# Patient Record
Sex: Female | Born: 1950 | Race: White | Hispanic: No | Marital: Married | Smoking: Never smoker
Health system: Southern US, Community
[De-identification: ages and names within clinical notes are randomized; demographics above are authoritative.]

## PROBLEM LIST (undated history)

## (undated) ENCOUNTER — Ambulatory Visit: Admission: EM | Payer: Medicare HMO | Source: Home / Self Care

## (undated) DIAGNOSIS — K219 Gastro-esophageal reflux disease without esophagitis: Secondary | ICD-10-CM

## (undated) DIAGNOSIS — I639 Cerebral infarction, unspecified: Secondary | ICD-10-CM

## (undated) DIAGNOSIS — R079 Chest pain, unspecified: Secondary | ICD-10-CM

## (undated) DIAGNOSIS — F32A Depression, unspecified: Secondary | ICD-10-CM

## (undated) DIAGNOSIS — E785 Hyperlipidemia, unspecified: Secondary | ICD-10-CM

## (undated) DIAGNOSIS — F329 Major depressive disorder, single episode, unspecified: Secondary | ICD-10-CM

## (undated) DIAGNOSIS — I213 ST elevation (STEMI) myocardial infarction of unspecified site: Secondary | ICD-10-CM

## (undated) DIAGNOSIS — I10 Essential (primary) hypertension: Secondary | ICD-10-CM

## (undated) DIAGNOSIS — F419 Anxiety disorder, unspecified: Secondary | ICD-10-CM

## (undated) DIAGNOSIS — R51 Headache: Secondary | ICD-10-CM

## (undated) HISTORY — DX: Gastro-esophageal reflux disease without esophagitis: K21.9

## (undated) HISTORY — PX: COLONOSCOPY: SHX5424

## (undated) HISTORY — DX: Chest pain, unspecified: R07.9

## (undated) HISTORY — DX: Cerebral infarction, unspecified: I63.9

## (undated) HISTORY — DX: Hyperlipidemia, unspecified: E78.5

## (undated) HISTORY — PX: TUBAL LIGATION: SHX77

---

## 2002-03-29 ENCOUNTER — Emergency Department (HOSPITAL_COMMUNITY): Admission: EM | Admit: 2002-03-29 | Discharge: 2002-03-29 | Payer: Self-pay | Admitting: Emergency Medicine

## 2003-05-30 ENCOUNTER — Other Ambulatory Visit: Admission: RE | Admit: 2003-05-30 | Discharge: 2003-05-30 | Payer: Self-pay | Admitting: Obstetrics and Gynecology

## 2003-10-21 ENCOUNTER — Ambulatory Visit (HOSPITAL_COMMUNITY): Admission: RE | Admit: 2003-10-21 | Discharge: 2003-10-21 | Payer: Self-pay | Admitting: Gastroenterology

## 2004-06-08 ENCOUNTER — Other Ambulatory Visit: Admission: RE | Admit: 2004-06-08 | Discharge: 2004-06-08 | Payer: Self-pay | Admitting: Obstetrics and Gynecology

## 2005-10-12 ENCOUNTER — Other Ambulatory Visit: Admission: RE | Admit: 2005-10-12 | Discharge: 2005-10-12 | Payer: Self-pay | Admitting: Obstetrics and Gynecology

## 2008-05-10 ENCOUNTER — Emergency Department (HOSPITAL_COMMUNITY): Admission: EM | Admit: 2008-05-10 | Discharge: 2008-05-10 | Payer: Self-pay | Admitting: Emergency Medicine

## 2010-03-19 DIAGNOSIS — R079 Chest pain, unspecified: Secondary | ICD-10-CM

## 2010-03-19 HISTORY — DX: Chest pain, unspecified: R07.9

## 2011-01-12 ENCOUNTER — Encounter
Admission: RE | Admit: 2011-01-12 | Discharge: 2011-01-12 | Payer: Self-pay | Source: Home / Self Care | Attending: Obstetrics and Gynecology | Admitting: Obstetrics and Gynecology

## 2011-05-14 NOTE — Op Note (Signed)
NAMETamia, [name]                             ACCOUNT NO.:  [id number]   MEDICAL RECORD NO.:  [id number]                   PATIENT TYPE:  AMB   LOCATION:  ENDO                                 FACILITY:  [facility]   PHYSICIAN:  Anselmo Rod, M.D.               DATE OF BIRTH:  05/08/1951   DATE OF PROCEDURE:  10/21/2003  DATE OF DISCHARGE:                                 OPERATIVE REPORT   PROCEDURE:  Screening colonoscopy.   ENDOSCOPIST:  Anselmo Rod, M.D.   INSTRUMENT USED:  Olympus video colonoscope.   INDICATIONS FOR PROCEDURE:  A screening colonoscopy is being performed in a  60 year old white female.  Rule out colonic polyps, masses, etc.   PRE-PROCEDURE PREPARATION:  An informed consent was procured from the  patient.  The patient was fasted for eight hours prior to the procedure, and  prepped with a bottle of magnesium citrate and one gallon of GoLYTELY on the  night prior to the procedure.   PRE-PROCEDURE PHYSICAL EXAMINATION:  VITAL SIGNS:  The patient had stable  vital signs.  NECK:  Supple.  CHEST:  Clear to auscultation.  HEART:  S1, S2 regular.  ABDOMEN:  Soft with normal bowel sounds.   DESCRIPTION OF PROCEDURE:  The patient was placed in the left lateral  decubitus position and sedated with 80 mg of Demerol and 8 mg of Versed  intravenously.  Once the patient was adequately sedated and maintained on  low-flow oxygen and continuous cardiac monitoring, the Olympus video  colonoscope was advanced from the rectum to the cecum.  The appendicular  orifice and the ileocecal valve were clearly visualized and photographed.  There was some residual stool in the colon.  Multiple washings were done.  Small lesions could have been missed.  No masses, polyps, erosions or  ulcerations were seen.  A small internal hemorrhoid was seen on retroflexion  in the rectum.  The patient tolerated the procedure well without complication.   IMPRESSION:  Normal colonoscopy up to  the cecum, except for a small internal  hemorrhoid.   RECOMMENDATIONS:  1. A high fiber diet with liberal fluid intake has been advocated.  2.     Repeat colorectal screening is recommended in the next 10 years, unless the      patient develops any abnormal symptoms in the interim.  3. Outpatient followup as the need arises in the future.                                               Anselmo Rod, M.D.    JNM/MEDQ  D:  10/21/2003  T:  10/21/2003  Job:  [id number]   cc:   Marcelino Duster L. Vincente Poli, M.D.  [address], [address]  [place]  [place] [address]  Fax: [phone]

## 2011-09-15 ENCOUNTER — Encounter: Payer: Self-pay | Admitting: *Deleted

## 2011-09-15 ENCOUNTER — Other Ambulatory Visit: Payer: Self-pay

## 2011-09-15 ENCOUNTER — Emergency Department (HOSPITAL_COMMUNITY)
Admission: EM | Admit: 2011-09-15 | Discharge: 2011-09-16 | Disposition: A | Discharge status: Home / Self Care | Payer: 59 | Attending: Emergency Medicine | Admitting: Emergency Medicine

## 2011-09-15 ENCOUNTER — Emergency Department (HOSPITAL_COMMUNITY): Payer: 59

## 2011-09-15 DIAGNOSIS — M542 Cervicalgia: Secondary | ICD-10-CM | POA: Insufficient documentation

## 2011-09-15 DIAGNOSIS — R51 Headache: Secondary | ICD-10-CM | POA: Insufficient documentation

## 2011-09-15 DIAGNOSIS — I10 Essential (primary) hypertension: Secondary | ICD-10-CM | POA: Insufficient documentation

## 2011-09-15 DIAGNOSIS — R0602 Shortness of breath: Secondary | ICD-10-CM | POA: Insufficient documentation

## 2011-09-15 DIAGNOSIS — R079 Chest pain, unspecified: Secondary | ICD-10-CM

## 2011-09-15 DIAGNOSIS — E876 Hypokalemia: Secondary | ICD-10-CM | POA: Insufficient documentation

## 2011-09-15 DIAGNOSIS — R0789 Other chest pain: Secondary | ICD-10-CM | POA: Insufficient documentation

## 2011-09-15 HISTORY — DX: Essential (primary) hypertension: I10

## 2011-09-15 LAB — BASIC METABOLIC PANEL
CO2: 23 mEq/L (ref 19–32)
Chloride: 106 mEq/L (ref 96–112)
Creatinine, Ser: 0.72 mg/dL (ref 0.50–1.10)
GFR calc Af Amer: >60 mL/min (ref >60)
Potassium: 2.7 mEq/L — CL (ref 3.5–5.1)
Sodium: 141 mEq/L (ref 135–145)

## 2011-09-15 LAB — CBC
HCT: 38.7 % (ref 36.0–46.0)
Hemoglobin: 13 g/dL (ref 12.0–15.0)
MCV: 89.8 fL (ref 78.0–100.0)
RBC: 4.31 MIL/uL (ref 3.87–5.11)
RDW: 13.2 % (ref 11.5–15.5)
WBC: 8.7 10*3/uL (ref 4.0–10.5)

## 2011-09-15 LAB — HEPATIC FUNCTION PANEL
AST: 19 U/L (ref 0–37)
Albumin: 4.1 g/dL (ref 3.5–5.2)
Alkaline Phosphatase: 78 U/L (ref 39–117)
Total Protein: 7.2 g/dL (ref 6.0–8.3)

## 2011-09-15 LAB — DIFFERENTIAL
Basophils Absolute: 0 10*3/uL (ref 0.0–0.1)
Eosinophils Relative: 2 % (ref 0–5)
Lymphocytes Relative: 40 % (ref 12–46)
Lymphs Abs: 3.5 10*3/uL (ref 0.7–4.0)
Monocytes Absolute: 0.5 10*3/uL (ref 0.1–1.0)
Neutro Abs: 4.5 10*3/uL (ref 1.7–7.7)

## 2011-09-15 LAB — POCT I-STAT TROPONIN I
Troponin i, poc: 0 ng/mL (ref 0.00–0.08)
Troponin i, poc: 0.02 ng/mL (ref 0.00–0.08)

## 2011-09-15 MED ORDER — SODIUM CHLORIDE 0.9 % IV SOLN
INTRAVENOUS | Status: DC
  Administered 2011-09-15: 20:00:00 via INTRAVENOUS

## 2011-09-15 MED ORDER — ACETAMINOPHEN 325 MG PO TABS
650.0000 mg | ORAL_TABLET | Freq: Once | ORAL | Status: AC
  Administered 2011-09-15: 650 mg via ORAL
  Filled 2011-09-15: qty 2

## 2011-09-15 MED ORDER — POTASSIUM CHLORIDE 10 MEQ/100ML IV SOLN
10.0000 meq | Freq: Once | INTRAVENOUS | Status: AC
  Administered 2011-09-15: 10 meq via INTRAVENOUS
  Filled 2011-09-15: qty 100

## 2011-09-15 MED ORDER — PANTOPRAZOLE SODIUM 40 MG IV SOLR
40.0000 mg | Freq: Once | INTRAVENOUS | Status: AC
  Administered 2011-09-15: 40 mg via INTRAVENOUS
  Filled 2011-09-15: qty 40

## 2011-09-15 MED ORDER — ONDANSETRON HCL 4 MG/2ML IJ SOLN
4.0000 mg | Freq: Once | INTRAMUSCULAR | Status: AC
  Administered 2011-09-15: 4 mg via INTRAVENOUS
  Filled 2011-09-15: qty 2

## 2011-09-15 MED ORDER — NITROGLYCERIN 0.4 MG SL SUBL: 0.4000 mg | SUBLINGUAL_TABLET | Freq: Once | SUBLINGUAL | Status: DC

## 2011-09-15 MED ORDER — ASPIRIN 81 MG PO CHEW
324.0000 mg | CHEWABLE_TABLET | Freq: Once | ORAL | Status: AC
  Administered 2011-09-15: 324 mg via ORAL
  Filled 2011-09-15: qty 4

## 2011-09-15 MED ORDER — NITROGLYCERIN 0.4 MG SL SUBL
0.4000 mg | SUBLINGUAL_TABLET | Freq: Once | SUBLINGUAL | Status: AC
  Administered 2011-09-15: 0.4 mg via SUBLINGUAL
  Filled 2011-09-15: qty 25

## 2011-09-15 MED ORDER — PANTOPRAZOLE SODIUM 20 MG PO TBEC: 20.0000 mg | DELAYED_RELEASE_TABLET | Freq: Every day | ORAL | Status: DC

## 2011-09-15 NOTE — ED Notes (Signed)
Pt provided a drink at this time. Pt states a increase in pain in the upper part of her throat when swallowing. EDP notified.

## 2011-09-15 NOTE — ED Notes (Signed)
MD at bedside. 

## 2011-09-15 NOTE — ED Notes (Signed)
Pt advised of further testing to be done & timeframe. Pt remains on cardiac monitor w/ NIBP vital signs WNL. NAD noted. No needs voiced at this time.

## 2011-09-15 NOTE — ED Provider Notes (Addendum)
History     CSN: [id number] Arrival date & time: 09/15/2011  6:42 PM   Chief Complaint  Patient presents with  . Chest Pain     (Include location/radiation/quality/duration/timing/severity/associated sxs/prior treatment) The history is provided by the patient and the spouse.  SEE MDM FOR SPECIFICS. NO PAST HX OF CAD.  NO HX OF SIMILAR PAIN OTHER THAN BRIEFLY EALRIER TODAYS. EPISODE AT 1830 DISCOMFORT PERSISTS. BUT IMPROVING.   Past Medical History  Diagnosis Date  . Hypertension      History reviewed. No pertinent past surgical history.  No family history on file.  History  Substance Use Topics  . Smoking status: Never Smoker   . Smokeless tobacco: Not on file  . Alcohol Use: No    OB History    Grav Para Term Preterm Abortions TAB SAB Ect Mult Living                  Review of Systems  Constitutional: Negative for fever and diaphoresis.  HENT: Positive for neck pain. Negative for congestion.   Eyes: Negative for redness and visual disturbance.  Respiratory: Positive for chest tightness and shortness of breath. Negative for cough, choking and stridor.   Cardiovascular: Positive for chest pain. Negative for palpitations.  Gastrointestinal: Negative for nausea, vomiting, abdominal pain and diarrhea.  Genitourinary: Negative for dysuria.  Musculoskeletal: Negative for myalgias and back pain.  Skin: Negative for rash.  Neurological: Positive for headaches. Negative for weakness and numbness.  Psychiatric/Behavioral: Negative for confusion.    Allergies  Penicillins  Home Medications   Current Outpatient Rx  Name Route Sig Dispense Refill  . BUPROPION HCL (XL) 300 MG PO TB24 Oral Take 300 mg by mouth daily.        Physical Exam    BP 114/69  Pulse 70  Temp(Src) 97.8 F (36.6 C) (Oral)  Resp 20  Ht 5\' 5"  (1.651 m)  Wt 160 lb (72.576 kg)  BMI 26.63 kg/m2  SpO2 97%  Physical Exam  Nursing note and vitals reviewed. Constitutional: She is oriented to  person, place, and time. She appears well-developed and well-nourished. No distress.  HENT:  Head: Normocephalic and atraumatic.  Mouth/Throat: Oropharynx is clear and moist.  Eyes: Conjunctivae and EOM are normal. Pupils are equal, round, and reactive to light.  Neck: Normal range of motion. Neck supple.  Cardiovascular: Normal rate, regular rhythm, normal heart sounds and intact distal pulses.   No murmur heard. Pulmonary/Chest: Effort normal and breath sounds normal. She has no wheezes. She exhibits no tenderness.  Abdominal: Soft. Bowel sounds are normal. There is no tenderness.  Musculoskeletal: Normal range of motion. She exhibits no edema and no tenderness.  Lymphadenopathy:    She has no cervical adenopathy.  Neurological: She is alert and oriented to person, place, and time. No cranial nerve deficit. She exhibits normal muscle tone. Coordination normal.  Skin: Skin is warm and dry. No rash noted. No erythema.    ED Course  Procedures  Results for orders placed during the hospital encounter of 09/15/11  POCT I-STAT TROPONIN I      Component Value Range   Troponin i, poc 0.00  0.00 - 0.08 (ng/mL)   Comment 3            Dg Chest Portable 1 View  09/15/2011  *RADIOLOGY REPORT*  Clinical Data: Chest pain started 20 minutes ago.  PORTABLE CHEST - 1 VIEW  Comparison: None.  Findings: The heart size is normal.  Mild  infra hilar bronchitic change is present bilaterally.  Minimal bibasilar atelectasis is noted.  No significant consolidation is evident.  The slight leftward curvature is noted in the mid thoracic spine.  The visualized soft tissues and bony thorax are otherwise unremarkable.  IMPRESSION:  1.  Mild infra hilar bronchitic change is likely chronic. 2.  Minimal bibasilar atelectasis. 3.  No other acute cardiopulmonary disease.  Original Report Authenticated By: Jamesetta Orleans. MATTERN, M.D.      Date: 09/15/2011  Rate: 71  Rhythm: normal sinus rhythm  QRS Axis: normal   Intervals: normal  ST/T Wave abnormalities: normal  Conduction Disutrbances:none  Narrative Interpretation:   Old EKG Reviewed: none available    MDM ATYPICAL CHEST PAIN AND THROAT BURNING AT 1830 WILL TAKING FIRST BITE OF FOOD. IT WAS SEVERE AND FELT LIKE THROAT WAS CLOSING AND PAIN ACROSS CHEST. SOME SOB NO DIAPHORISIS. SIMILAR BUT MILDER EVENT AT NOON WHILE TAKING ONE BITE OF A BANANA. CARDIAC RISK FACTORS INCLUDE HIGH CHOLESTEROL AND AND HTN. EVENTS SOUND NONCARDIAC AND MAY BE ESOPHAGEAL RELATED. FIRST 2 CARDAIC TN NEGATIVE CXR NEGATIVE LABS NEGATIVE EX FOR HYPOKALEMIA 2.7 RX WITH IV POTASSIUM X 2 10 MEQ. WILL REPEAT TN AT HOUR MARK 0030 IF NEG CAN BE DISCHARGED ON PROTONIX AND HAS CARDIOLOGY FOLLOW UP TOMORROW. NO HX OF CAD TO DATE. IN ED RX WITH ASA NTG AND PROTONIX. NOW ABLE TO DRINK SPRITE FIND, NOT CW OBSTRUCTON OR FOOD IMPACTION.   IMP: CHEST PAIN.          Shelda Jakes, MD 09/15/11 0981  Shelda Jakes, MD 09/15/11 1914  Shelda Jakes, MD 09/15/11 [phone]

## 2011-09-15 NOTE — ED Notes (Signed)
C/o chest burning and felt like throat was closing

## 2011-09-15 NOTE — ED Notes (Signed)
Pt sipping on drink at this time. States not hurting like earlier & thinks she can take meds. EDP notified, verbal order for tylenol given.

## 2011-09-15 NOTE — ED Notes (Signed)
Nitro held at this time. Pt states chest pain is better.

## 2011-09-15 NOTE — ED Notes (Signed)
Pt reports cp that started about 20 min ago. Described as a tightness & also states her jaw feels numb. ekg done. Saline lock in place & blood drawn.

## 2011-09-15 NOTE — ED Notes (Signed)
Pt states no change in pain after nitro.

## 2011-09-15 NOTE — ED Notes (Signed)
CRITICAL VALUE ALERT  Critical value received:  Potassium 2.7  Date of notification:  09/15/11  Time of notification:  1940  Critical value read back:yes  Nurse who received alert:  Thornton Dales, RN  MD notified (1st page):  [email or web address]  Time of first page:  1940  MD notified (2nd page):  Time of second page:  Responding MD:  zackowski  Time MD responded:  [phone]

## 2011-09-16 LAB — POCT I-STAT TROPONIN I: Troponin i, poc: 0.04 ng/mL (ref 0.00–0.08)

## 2011-09-16 MED ORDER — POTASSIUM CHLORIDE CRYS ER 20 MEQ PO TBCR
60.0000 meq | EXTENDED_RELEASE_TABLET | Freq: Once | ORAL | Status: AC
  Administered 2011-09-16: 60 meq via ORAL
  Filled 2011-09-16: qty 3

## 2011-09-16 NOTE — Progress Notes (Signed)
0003 Assumed care/disposition of patient. Patient presented with esophageal symptoms. Cardiac marker x 1  negative. Awaiting 2nd marker. If marker normal, patient to be discharged home. She has follow up later today with [facility]. Potassium was 2.7. She received 2 runs of 10 mEq potassium. 0144 Cardiac marker negative. Patient is painfree. Will replete additional potassium orally. She is to keep appointment tomorrow at [facility].Given 60 mEq potassium PO. Results for orders placed during the hospital encounter of 09/15/11  BASIC METABOLIC PANEL      Component Value Range   Sodium 141  135 - 145 (mEq/L)   Potassium 2.7 (*) 3.5 - 5.1 (mEq/L)   Chloride 106  96 - 112 (mEq/L)   CO2 23  19 - 32 (mEq/L)   Glucose, Bld 78  70 - 99 (mg/dL)   BUN 23  6 - 23 (mg/dL)   Creatinine, Ser 6.57  0.50 - 1.10 (mg/dL)   Calcium 9.1  8.4 - 84.6 (mg/dL)   GFR calc non Af Amer >60  >60 (mL/min)   GFR calc Af Amer >60  >60 (mL/min)  CBC      Component Value Range   WBC 8.7  4.0 - 10.5 (K/uL)   RBC 4.31  3.87 - 5.11 (MIL/uL)   Hemoglobin 13.0  12.0 - 15.0 (g/dL)   HCT 96.2  95.2 - 84.1 (%)   MCV 89.8  78.0 - 100.0 (fL)   MCH 30.2  26.0 - 34.0 (pg)   MCHC 33.6  30.0 - 36.0 (g/dL)   RDW 32.4  40.1 - 02.7 (%)   Platelets 294  150 - 400 (K/uL)  DIFFERENTIAL      Component Value Range   Neutrophils Relative 51  43 - 77 (%)   Neutro Abs 4.5  1.7 - 7.7 (K/uL)   Lymphocytes Relative 40  12 - 46 (%)   Lymphs Abs 3.5  0.7 - 4.0 (K/uL)   Monocytes Relative 6  3 - 12 (%)   Monocytes Absolute 0.5  0.1 - 1.0 (K/uL)   Eosinophils Relative 2  0 - 5 (%)   Eosinophils Absolute 0.2  0.0 - 0.7 (K/uL)   Basophils Relative 1  0 - 1 (%)   Basophils Absolute 0.0  0.0 - 0.1 (K/uL)  POCT I-STAT TROPONIN I      Component Value Range   Troponin i, poc 0.00  0.00 - 0.08 (ng/mL)   Comment 3           HEPATIC FUNCTION PANEL      Component Value Range   Total Protein 7.2  6.0 - 8.3 (g/dL)   Albumin 4.1  3.5 -  5.2 (g/dL)   AST 19  0 - 37 (U/L)   ALT 17  0 - 35 (U/L)   Alkaline Phosphatase 78  39 - 117 (U/L)   Total Bilirubin 0.2 (*) 0.3 - 1.2 (mg/dL)   Bilirubin, Direct <2.5  0.0 - 0.3 (mg/dL)   Indirect Bilirubin NOT CALCULATED  0.3 - 0.9 (mg/dL)  POCT I-STAT TROPONIN I      Component Value Range   Troponin i, poc 0.02  0.00 - 0.08 (ng/mL)   Comment 3           POCT I-STAT TROPONIN I      Component Value Range   Troponin i, poc 0.04  0.00 - 0.08 (ng/mL)   Comment 3

## 2011-09-22 LAB — URINALYSIS, ROUTINE W REFLEX MICROSCOPIC
Bilirubin Urine: NEGATIVE
Glucose, UA: NEGATIVE
Ketones, ur: NEGATIVE
Protein, ur: NEGATIVE

## 2011-09-22 LAB — URINE CULTURE: Colony Count: >=100000

## 2011-09-22 LAB — URINE MICROSCOPIC-ADD ON

## 2011-11-09 ENCOUNTER — Encounter (HOSPITAL_COMMUNITY): Payer: Self-pay | Admitting: Pharmacy Technician

## 2011-11-16 ENCOUNTER — Encounter (HOSPITAL_COMMUNITY): Payer: Self-pay

## 2011-11-16 ENCOUNTER — Other Ambulatory Visit: Payer: Self-pay

## 2011-11-16 ENCOUNTER — Encounter (HOSPITAL_COMMUNITY)
Admission: RE | Admit: 2011-11-16 | Discharge: 2011-11-16 | Disposition: A | Discharge status: Home / Self Care | Payer: BC Managed Care – PPO | Source: Ambulatory Visit | Attending: Ophthalmology | Admitting: Ophthalmology

## 2011-11-16 HISTORY — DX: Depression, unspecified: F32.A

## 2011-11-16 HISTORY — DX: Major depressive disorder, single episode, unspecified: F32.9

## 2011-11-16 HISTORY — DX: Headache: R51

## 2011-11-16 LAB — CBC
HCT: 39.4 % (ref 36.0–46.0)
MCHC: 32 g/dL (ref 30.0–36.0)
RDW: 13.6 % (ref 11.5–15.5)

## 2011-11-16 LAB — BASIC METABOLIC PANEL
BUN: 15 mg/dL (ref 6–23)
Creatinine, Ser: 0.86 mg/dL (ref 0.50–1.10)
GFR calc Af Amer: 83 mL/min — ABNORMAL LOW (ref >90)
GFR calc non Af Amer: 72 mL/min — ABNORMAL LOW (ref >90)
Potassium: 3.3 mEq/L — ABNORMAL LOW (ref 3.5–5.1)

## 2011-11-16 NOTE — Patient Instructions (Addendum)
20 Katherine Short  11/16/2011   Your procedure is scheduled on:  11/22/2011  Report to [facility] at 11:00 AM.  Call this number if you have problems the morning of surgery: [phone]   Remember:   Do not eat food:After Midnight.  Do not drink clear liquids: After Midnight.  Take these medicines the morning of surgery with A SIP OF WATER:    Do not wear jewelry, make-up or nail polish.  Do not wear lotions, powders, or perfumes. You may wear deodorant.  Do not shave 48 hours prior to surgery.  Do not bring valuables to the hospital.  Contacts, dentures or bridgework may not be worn into surgery.  Leave suitcase in the car. After surgery it may be brought to your room.  For patients admitted to the hospital, checkout time is 11:00 AM the day of discharge.   Patients discharged the day of surgery will not be allowed to drive home.  Name and phone number of your driver:   Special Instructions: N/A   Please read over the following fact sheets that you were given: Pain Booklet, Anesthesia Post-op Instructions and Care and Recovery After Surgery

## 2011-11-22 ENCOUNTER — Encounter (HOSPITAL_COMMUNITY)
Admission: RE | Disposition: A | Discharge status: Home / Self Care | Payer: Self-pay | Source: Ambulatory Visit | Attending: Ophthalmology

## 2011-11-22 ENCOUNTER — Encounter (HOSPITAL_COMMUNITY): Payer: Self-pay | Admitting: *Deleted

## 2011-11-22 ENCOUNTER — Ambulatory Visit (HOSPITAL_COMMUNITY)
Admission: RE | Admit: 2011-11-22 | Discharge: 2011-11-22 | Disposition: A | Discharge status: Home / Self Care | Payer: BC Managed Care – PPO | Source: Ambulatory Visit | Attending: Ophthalmology | Admitting: Ophthalmology

## 2011-11-22 ENCOUNTER — Ambulatory Visit (HOSPITAL_COMMUNITY): Payer: BC Managed Care – PPO | Admitting: Anesthesiology

## 2011-11-22 ENCOUNTER — Encounter (HOSPITAL_COMMUNITY): Payer: Self-pay | Admitting: Anesthesiology

## 2011-11-22 DIAGNOSIS — Z01810 Encounter for preprocedural cardiovascular examination: Secondary | ICD-10-CM | POA: Insufficient documentation

## 2011-11-22 DIAGNOSIS — Z01812 Encounter for preprocedural laboratory examination: Secondary | ICD-10-CM | POA: Insufficient documentation

## 2011-11-22 DIAGNOSIS — Z79899 Other long term (current) drug therapy: Secondary | ICD-10-CM | POA: Insufficient documentation

## 2011-11-22 DIAGNOSIS — H2510 Age-related nuclear cataract, unspecified eye: Secondary | ICD-10-CM | POA: Insufficient documentation

## 2011-11-22 DIAGNOSIS — I10 Essential (primary) hypertension: Secondary | ICD-10-CM | POA: Insufficient documentation

## 2011-11-22 HISTORY — PX: CATARACT EXTRACTION W/PHACO: SHX586

## 2011-11-22 SURGERY — PHACOEMULSIFICATION, CATARACT, WITH IOL INSERTION
Anesthesia: Monitor Anesthesia Care | Site: Eye | Laterality: Left | Wound class: Clean

## 2011-11-22 MED ORDER — ONDANSETRON HCL 4 MG/2ML IJ SOLN: 4.0000 mg | Freq: Once | INTRAMUSCULAR | Status: DC | PRN

## 2011-11-22 MED ORDER — LIDOCAINE HCL (PF) 1 % IJ SOLN
INTRAMUSCULAR | Status: AC
  Filled 2011-11-22: qty 2

## 2011-11-22 MED ORDER — POVIDONE-IODINE 5 % OP SOLN
OPHTHALMIC | Status: DC | PRN
Start: 2011-11-22 — End: 2011-11-22
  Administered 2011-11-22: 1 via OPHTHALMIC

## 2011-11-22 MED ORDER — NEOMYCIN-POLYMYXIN-DEXAMETH 3.5-10000-0.1 OP OINT
TOPICAL_OINTMENT | OPHTHALMIC | Status: AC
  Filled 2011-11-22: qty 3.5

## 2011-11-22 MED ORDER — TETRACAINE HCL 0.5 % OP SOLN
OPHTHALMIC | Status: AC
  Filled 2011-11-22: qty 2

## 2011-11-22 MED ORDER — LACTATED RINGERS IV SOLN
INTRAVENOUS | Status: DC
  Administered 2011-11-22: 11:00:00 via INTRAVENOUS

## 2011-11-22 MED ORDER — FENTANYL CITRATE 0.05 MG/ML IJ SOLN: 25.0000 ug | INTRAMUSCULAR | Status: DC | PRN

## 2011-11-22 MED ORDER — MIDAZOLAM HCL 2 MG/2ML IJ SOLN
INTRAMUSCULAR | Status: AC
  Filled 2011-11-22: qty 2

## 2011-11-22 MED ORDER — CYCLOPENTOLATE-PHENYLEPHRINE 0.2-1 % OP SOLN
1.0000 [drp] | OPHTHALMIC | Status: AC
  Administered 2011-11-22 (×3): 1 [drp] via OPHTHALMIC

## 2011-11-22 MED ORDER — MIDAZOLAM HCL 2 MG/2ML IJ SOLN
1.0000 mg | INTRAMUSCULAR | Status: DC | PRN
  Administered 2011-11-22: 2 mg via INTRAVENOUS

## 2011-11-22 MED ORDER — EPINEPHRINE HCL 1 MG/ML IJ SOLN
INTRAMUSCULAR | Status: AC
  Filled 2011-11-22: qty 1

## 2011-11-22 MED ORDER — PHENYLEPHRINE HCL 2.5 % OP SOLN
OPHTHALMIC | Status: AC
  Filled 2011-11-22: qty 2

## 2011-11-22 MED ORDER — PROVISC 10 MG/ML IO SOLN
INTRAOCULAR | Status: DC | PRN
  Administered 2011-11-22: 8.5 mg via INTRAOCULAR

## 2011-11-22 MED ORDER — NEOMYCIN-POLYMYXIN-DEXAMETH 0.1 % OP OINT
TOPICAL_OINTMENT | OPHTHALMIC | Status: DC | PRN
  Administered 2011-11-22: 1 via OPHTHALMIC

## 2011-11-22 MED ORDER — LIDOCAINE HCL 3.5 % OP GEL
OPHTHALMIC | Status: AC
  Filled 2011-11-22: qty 5

## 2011-11-22 MED ORDER — TETRACAINE HCL 0.5 % OP SOLN
1.0000 [drp] | OPHTHALMIC | Status: AC
  Administered 2011-11-22 (×3): 1 [drp] via OPHTHALMIC

## 2011-11-22 MED ORDER — MIDAZOLAM HCL 5 MG/5ML IJ SOLN
INTRAMUSCULAR | Status: DC | PRN
  Administered 2011-11-22: 2 mg via INTRAVENOUS

## 2011-11-22 MED ORDER — MIDAZOLAM HCL 2 MG/2ML IJ SOLN
INTRAMUSCULAR | Status: AC
  Administered 2011-11-22: 2 mg via INTRAVENOUS
  Filled 2011-11-22: qty 2

## 2011-11-22 MED ORDER — BSS IO SOLN
INTRAOCULAR | Status: DC | PRN
  Administered 2011-11-22: 15 mL via INTRAOCULAR

## 2011-11-22 MED ORDER — CYCLOPENTOLATE-PHENYLEPHRINE 0.2-1 % OP SOLN
OPHTHALMIC | Status: AC
  Filled 2011-11-22: qty 2

## 2011-11-22 MED ORDER — LIDOCAINE HCL (PF) 1 % IJ SOLN
INTRAMUSCULAR | Status: DC | PRN
  Administered 2011-11-22: .4 mL

## 2011-11-22 MED ORDER — PHENYLEPHRINE HCL 2.5 % OP SOLN
1.0000 [drp] | OPHTHALMIC | Status: AC
  Administered 2011-11-22 (×3): 1 [drp] via OPHTHALMIC

## 2011-11-22 MED ORDER — LIDOCAINE HCL 3.5 % OP GEL
1.0000 "application " | Freq: Once | OPHTHALMIC | Status: AC
  Administered 2011-11-22: 1 via OPHTHALMIC

## 2011-11-22 MED ORDER — LIDOCAINE 3.5 % OP GEL OPTIME - NO CHARGE
OPHTHALMIC | Status: DC | PRN
  Administered 2011-11-22: 1 [drp] via OPHTHALMIC

## 2011-11-22 MED ORDER — EPINEPHRINE HCL 1 MG/ML IJ SOLN
INTRAOCULAR | Status: DC | PRN
  Administered 2011-11-22: 12:00:00

## 2011-11-22 SURGICAL SUPPLY — 32 items

## 2011-11-22 NOTE — Op Note (Signed)
[name], Katherine Short                   ACCOUNT NO.:  [id number]  MEDICAL RECORD NO.:  [id number]  LOCATION:  APPO                          FACILITY:  [facility]  PHYSICIAN:  Susanne Greenhouse, MD       DATE OF BIRTH:  02-06-1951  DATE OF PROCEDURE:  11/22/2011 DATE OF DISCHARGE:  11/22/2011                              OPERATIVE REPORT   PREOPERATIVE DIAGNOSIS:  Combined cataract, left eye.  POSTOPERATIVE DIAGNOSIS:  Combined cataract, left eye.  DIAGNOSIS CODE:  366.19.  OPERATION PERFORMED:  Phacoemulsification with posterior chamber intraocular lens implantation, left eye.  SURGEON:  Bonne Dolores. Leslie Langille, MD  ANESTHESIA:  General endotracheal anesthesia.  OPERATIVE SUMMARY:  In the preoperative area, dilating drops were placed into the left eye.  The patient was then brought into the operating room where she was placed under general anesthesia.  The eye was then prepped and draped.  Beginning with a 75 blade, a paracentesis port was made at the surgeon's 2 o'clock position.  The anterior chamber was then filled with a 1% nonpreserved lidocaine solution with epinephrine.  This was followed by Viscoat to deepen the chamber.  A small fornix-based peritomy was performed superiorly.  Next, a single iris hook was placed through the limbus superiorly.  A 2.4-mm keratome blade was then used to make a clear corneal incision over the iris hook.  A bent cystotome needle and Utrata forceps were used to create a continuous tear capsulotomy.  Hydrodissection was performed using balanced salt solution on a fine cannula.  The lens nucleus was then removed using phacoemulsification in a quadrant cracking technique.  The cortical material was then removed with irrigation and aspiration.  The capsular bag and anterior chamber were refilled with Provisc.  The wound was widened to approximately 3 mm and a posterior chamber intraocular lens was placed into the capsular bag without difficulty using an [employer]  lens injecting system.  A single 10-0 nylon suture was then used to close the incision as well as stromal hydration.  The Provisc was removed from the anterior chamber and capsular bag with irrigation and aspiration.  At this point, the wounds were tested for leak, which were negative.  The anterior chamber remained deep and stable.  The patient tolerated the procedure well.  There were no operative complications, and she awoke from general anesthesia without problem.  No surgical specimens.  Prosthetic device used is a Lenstec posterior chamber lens, model Softec HD, power of 13.0, serial number is [id number].          ______________________________ Susanne Greenhouse, MD     KEH/MEDQ  D:  11/22/2011  T:  11/22/2011  Job:  [id number]

## 2011-11-22 NOTE — Brief Op Note (Signed)
Pre-Op Dx: Cataract OS Post-Op Dx: Cataract OS Surgeon: Curties Conigliaro Anesthesia: Topical with MAC Implant: Lenstec, Model Softec HD Specimen: None Complications: None 

## 2011-11-22 NOTE — Anesthesia Procedure Notes (Signed)
Procedure Name: MAC Date/Time: 11/22/2011 11:47 AM Performed by: Minerva Areola Pre-anesthesia Checklist: Patient identified, Patient being monitored, Emergency Drugs available, Timeout performed and Suction available Patient Re-evaluated:Patient Re-evaluated prior to inductionOxygen Delivery Method: Nasal Cannula

## 2011-11-22 NOTE — H&P (Signed)
I have reviewed the H&P, the patient was re-examined, and I have identified no interval changes in medical condition and plan of care since the history and physical of record  

## 2011-11-22 NOTE — Anesthesia Preprocedure Evaluation (Signed)
Anesthesia Evaluation  Patient identified by MRN, date of birth, ID band Patient awake    Reviewed: Allergy & Precautions, H&P , NPO status   History of Anesthesia Complications Negative for: history of anesthetic complications  Airway Mallampati: I      Dental  (+) Teeth Intact   Pulmonary neg pulmonary ROS,    Pulmonary exam normal       Cardiovascular hypertension, Pt. on medications Regular Normal    Neuro/Psych  Headaches, PSYCHIATRIC DISORDERS Depression    GI/Hepatic   Endo/Other    Renal/GU      Musculoskeletal   Abdominal   Peds  Hematology   Anesthesia Other Findings   Reproductive/Obstetrics                           Anesthesia Physical Anesthesia Plan  ASA: II  Anesthesia Plan: MAC   Post-op Pain Management:    Induction: Intravenous  Airway Management Planned: Nasal Cannula  Additional Equipment:   Intra-op Plan:   Post-operative Plan:   Informed Consent: I have reviewed the patients History and Physical, chart, labs and discussed the procedure including the risks, benefits and alternatives for the proposed anesthesia with the patient or authorized representative who has indicated his/her understanding and acceptance.     Plan Discussed with:   Anesthesia Plan Comments:         Anesthesia Quick Evaluation

## 2011-11-22 NOTE — Anesthesia Postprocedure Evaluation (Signed)
  Anesthesia Post-op Note  Patient: Katherine Short  Procedure(s) Performed:  CATARACT EXTRACTION PHACO AND INTRAOCULAR LENS PLACEMENT (IOC) - CDE:7.76  Patient Location:  Short Stay  Anesthesia Type: MAC  Level of Consciousness: awake  Airway and Oxygen Therapy: Patient Spontanous Breathing  Post-op Pain: none  Post-op Assessment: Post-op Vital signs reviewed, Patient's Cardiovascular Status Stable, Respiratory Function Stable, Patent Airway, No signs of Nausea or vomiting and Pain level controlled  Post-op Vital Signs: Reviewed and stable  Complications: No apparent anesthesia complications

## 2011-11-22 NOTE — Transfer of Care (Signed)
Immediate Anesthesia Transfer of Care Note  Patient: Katherine Short  Procedure(s) Performed:  CATARACT EXTRACTION PHACO AND INTRAOCULAR LENS PLACEMENT (IOC) - CDE:7.76  Patient Location: Shortstay  Anesthesia Type: MAC  Level of Consciousness: awake  Airway & Oxygen Therapy: Patient Spontanous Breathing   Post-op Assessment: Report given to PACU RN, Post -op Vital signs reviewed and stable and Patient moving all extremities  Post vital signs: Reviewed and stable  Complications: No apparent anesthesia complications

## 2011-11-24 ENCOUNTER — Encounter (HOSPITAL_COMMUNITY)
Admission: RE | Admit: 2011-11-24 | Discharge: 2011-11-24 | Disposition: A | Discharge status: Home / Self Care | Payer: BC Managed Care – PPO | Source: Ambulatory Visit | Attending: Ophthalmology | Admitting: Ophthalmology

## 2011-11-24 ENCOUNTER — Encounter (HOSPITAL_COMMUNITY): Payer: Self-pay

## 2011-11-26 ENCOUNTER — Encounter (HOSPITAL_COMMUNITY): Payer: Self-pay | Admitting: Ophthalmology

## 2011-12-02 ENCOUNTER — Ambulatory Visit (HOSPITAL_COMMUNITY)
Admission: RE | Admit: 2011-12-02 | Discharge: 2011-12-02 | Disposition: A | Discharge status: Home / Self Care | Payer: BC Managed Care – PPO | Source: Ambulatory Visit | Attending: Ophthalmology | Admitting: Ophthalmology

## 2011-12-02 ENCOUNTER — Encounter (HOSPITAL_COMMUNITY): Payer: Self-pay | Admitting: Anesthesiology

## 2011-12-02 ENCOUNTER — Encounter (HOSPITAL_COMMUNITY)
Admission: RE | Disposition: A | Discharge status: Home / Self Care | Payer: Self-pay | Source: Ambulatory Visit | Attending: Ophthalmology

## 2011-12-02 ENCOUNTER — Ambulatory Visit (HOSPITAL_COMMUNITY): Payer: BC Managed Care – PPO | Admitting: Anesthesiology

## 2011-12-02 ENCOUNTER — Encounter (HOSPITAL_COMMUNITY): Payer: Self-pay | Admitting: *Deleted

## 2011-12-02 DIAGNOSIS — I10 Essential (primary) hypertension: Secondary | ICD-10-CM | POA: Insufficient documentation

## 2011-12-02 DIAGNOSIS — H2589 Other age-related cataract: Secondary | ICD-10-CM | POA: Insufficient documentation

## 2011-12-02 DIAGNOSIS — Z79899 Other long term (current) drug therapy: Secondary | ICD-10-CM | POA: Insufficient documentation

## 2011-12-02 HISTORY — PX: CATARACT EXTRACTION W/PHACO: SHX586

## 2011-12-02 SURGERY — PHACOEMULSIFICATION, CATARACT, WITH IOL INSERTION
Anesthesia: Monitor Anesthesia Care | Site: Eye | Laterality: Right | Wound class: Clean

## 2011-12-02 MED ORDER — MIDAZOLAM HCL 2 MG/2ML IJ SOLN
1.0000 mg | INTRAMUSCULAR | Status: DC | PRN
  Administered 2011-12-02: 2 mg via INTRAVENOUS

## 2011-12-02 MED ORDER — LIDOCAINE HCL 3.5 % OP GEL
1.0000 "application " | Freq: Once | OPHTHALMIC | Status: AC
  Administered 2011-12-02: 1 via OPHTHALMIC

## 2011-12-02 MED ORDER — PHENYLEPHRINE HCL 2.5 % OP SOLN
1.0000 [drp] | OPHTHALMIC | Status: AC
  Administered 2011-12-02 (×3): 1 [drp] via OPHTHALMIC

## 2011-12-02 MED ORDER — MIDAZOLAM HCL 2 MG/2ML IJ SOLN
INTRAMUSCULAR | Status: AC
  Filled 2011-12-02: qty 2

## 2011-12-02 MED ORDER — CYCLOPENTOLATE-PHENYLEPHRINE 0.2-1 % OP SOLN
1.0000 [drp] | OPHTHALMIC | Status: AC
  Administered 2011-12-02 (×3): 1 [drp] via OPHTHALMIC

## 2011-12-02 MED ORDER — EPINEPHRINE HCL 1 MG/ML IJ SOLN
INTRAMUSCULAR | Status: AC
  Filled 2011-12-02: qty 1

## 2011-12-02 MED ORDER — TETRACAINE HCL 0.5 % OP SOLN
OPHTHALMIC | Status: AC
  Administered 2011-12-02: 1 [drp] via OPHTHALMIC
  Filled 2011-12-02: qty 2

## 2011-12-02 MED ORDER — LACTATED RINGERS IV SOLN
INTRAVENOUS | Status: DC
  Administered 2011-12-02: 1000 mL via INTRAVENOUS

## 2011-12-02 MED ORDER — BSS IO SOLN
INTRAOCULAR | Status: DC | PRN
  Administered 2011-12-02: 15 mL via INTRAOCULAR

## 2011-12-02 MED ORDER — EPINEPHRINE HCL 1 MG/ML IJ SOLN
INTRAOCULAR | Status: DC | PRN
  Administered 2011-12-02: 08:00:00

## 2011-12-02 MED ORDER — CYCLOPENTOLATE-PHENYLEPHRINE 0.2-1 % OP SOLN
OPHTHALMIC | Status: AC
  Administered 2011-12-02: 1 [drp] via OPHTHALMIC
  Filled 2011-12-02: qty 2

## 2011-12-02 MED ORDER — GLYCOPYRROLATE 0.2 MG/ML IJ SOLN
INTRAMUSCULAR | Status: AC
  Filled 2011-12-02: qty 1

## 2011-12-02 MED ORDER — LIDOCAINE HCL (PF) 1 % IJ SOLN
INTRAMUSCULAR | Status: DC | PRN
  Administered 2011-12-02: .5 mL

## 2011-12-02 MED ORDER — LIDOCAINE HCL 3.5 % OP GEL
OPHTHALMIC | Status: AC
  Administered 2011-12-02: 1 via OPHTHALMIC
  Filled 2011-12-02: qty 5

## 2011-12-02 MED ORDER — LIDOCAINE HCL (PF) 1 % IJ SOLN
INTRAMUSCULAR | Status: AC
  Filled 2011-12-02: qty 2

## 2011-12-02 MED ORDER — PHENYLEPHRINE HCL 2.5 % OP SOLN
OPHTHALMIC | Status: AC
  Administered 2011-12-02: 1 [drp] via OPHTHALMIC
  Filled 2011-12-02: qty 2

## 2011-12-02 MED ORDER — POVIDONE-IODINE 5 % OP SOLN
OPHTHALMIC | Status: DC | PRN
  Administered 2011-12-02: 1 via OPHTHALMIC

## 2011-12-02 MED ORDER — PROVISC 10 MG/ML IO SOLN
INTRAOCULAR | Status: DC | PRN
  Administered 2011-12-02: 8.5 mg via INTRAOCULAR

## 2011-12-02 MED ORDER — TETRACAINE HCL 0.5 % OP SOLN
1.0000 [drp] | OPHTHALMIC | Status: AC
  Administered 2011-12-02 (×3): 1 [drp] via OPHTHALMIC

## 2011-12-02 MED ORDER — NEOMYCIN-POLYMYXIN-DEXAMETH 3.5-10000-0.1 OP OINT
TOPICAL_OINTMENT | OPHTHALMIC | Status: AC
  Filled 2011-12-02: qty 3.5

## 2011-12-02 MED ORDER — LIDOCAINE 3.5 % OP GEL OPTIME - NO CHARGE
OPHTHALMIC | Status: DC | PRN
  Administered 2011-12-02: 1 [drp] via OPHTHALMIC

## 2011-12-02 MED ORDER — MIDAZOLAM HCL 5 MG/5ML IJ SOLN
INTRAMUSCULAR | Status: DC | PRN
  Administered 2011-12-02: 2 mg via INTRAVENOUS

## 2011-12-02 MED ORDER — NEOMYCIN-POLYMYXIN-DEXAMETH 0.1 % OP OINT
TOPICAL_OINTMENT | OPHTHALMIC | Status: DC | PRN
  Administered 2011-12-02: 1 via OPHTHALMIC

## 2011-12-02 SURGICAL SUPPLY — 32 items

## 2011-12-02 NOTE — H&P (Signed)
I have reviewed the H&P, the patient was re-examined, and I have identified no interval changes in medical condition and plan of care since the history and physical of record  

## 2011-12-02 NOTE — Anesthesia Postprocedure Evaluation (Signed)
  Anesthesia Post-op Note  Patient: Katherine Short  Procedure(s) Performed:  CATARACT EXTRACTION PHACO AND INTRAOCULAR LENS PLACEMENT (IOC) - CDE=11.92  Patient Location: PACU and Short Stay  Anesthesia Type: MAC  Level of Consciousness: awake, alert  and oriented  Airway and Oxygen Therapy: Patient Spontanous Breathing  Post-op Pain: none  Post-op Assessment: Post-op Vital signs reviewed, Patient's Cardiovascular Status Stable, Respiratory Function Stable and No signs of Nausea or vomiting  Post-op Vital Signs: Reviewed and stable  Complications: No apparent anesthesia complications

## 2011-12-02 NOTE — Brief Op Note (Signed)
Pre-Op Dx: Cataract OD Post-Op Dx: Cataract OD Surgeon: Tulani Kidney Anesthesia: Topical with MAC Implant: Lenstec, Model Softec HD Blood Loss: None Specimen: None Complications: None 

## 2011-12-02 NOTE — Transfer of Care (Signed)
Immediate Anesthesia Transfer of Care Note  Patient: Katherine Short  Procedure(s) Performed:  CATARACT EXTRACTION PHACO AND INTRAOCULAR LENS PLACEMENT (IOC) - CDE=11.92  Patient Location: PACU and Short Stay  Anesthesia Type: MAC  Level of Consciousness: awake, alert  and oriented  Airway & Oxygen Therapy: Patient Spontanous Breathing  Post-op Assessment: Report given to PACU RN  Post vital signs: Reviewed and stable  Complications: No apparent anesthesia complications

## 2011-12-02 NOTE — Anesthesia Preprocedure Evaluation (Addendum)
Anesthesia Evaluation  Patient identified by MRN, date of birth, ID band Patient awake    Reviewed: Allergy & Precautions, H&P , NPO status   History of Anesthesia Complications Negative for: history of anesthetic complications  Airway Mallampati: I      Dental  (+) Teeth Intact   Pulmonary neg pulmonary ROS,    Pulmonary exam normal       Cardiovascular hypertension, Pt. on medications Regular Normal    Neuro/Psych  Headaches, PSYCHIATRIC DISORDERS Depression    GI/Hepatic   Endo/Other    Renal/GU      Musculoskeletal   Abdominal   Peds  Hematology   Anesthesia Other Findings   Reproductive/Obstetrics                           Anesthesia Physical Anesthesia Plan  ASA: II  Anesthesia Plan: MAC   Post-op Pain Management:    Induction:   Airway Management Planned: Nasal Cannula  Additional Equipment:   Intra-op Plan:   Post-operative Plan:   Informed Consent: I have reviewed the patients History and Physical, chart, labs and discussed the procedure including the risks, benefits and alternatives for the proposed anesthesia with the patient or authorized representative who has indicated his/her understanding and acceptance.     Plan Discussed with:   Anesthesia Plan Comments:        Anesthesia Quick Evaluation

## 2011-12-02 NOTE — Op Note (Signed)
Katherine Short, Katherine Short                   ACCOUNT NO.:  [id number]  MEDICAL RECORD NO.:  [id number]  LOCATION:  [facility]                          FACILITY:  [facility]  PHYSICIAN:  Susanne Greenhouse, MD       DATE OF BIRTH:  10-25-1951  DATE OF PROCEDURE:  12/02/2011 DATE OF DISCHARGE:  12/02/2011                              OPERATIVE REPORT   PREOPERATIVE DIAGNOSIS:  Combined cataract, right eye, diagnosis code 366.19.  POSTOPERATIVE DIAGNOSIS:  Combined cataract, right eye, diagnosis code 366.19.  OPERATION PERFORMED:  Phacoemulsification with posterior chamber intraocular lens implantation, right eye.  SURGEON:  Bonne Dolores. Benjamyn Hestand, MD  ANESTHESIA:  General endotracheal anesthesia.  OPERATIVE SUMMARY:  In the preoperative area, dilating drops were placed into the right eye.  The patient was then brought into the operating room where she was placed under general anesthesia.  The eye was then prepped and draped.  Beginning with a 75 blade, a paracentesis port was made at the surgeon's 2 o'clock position.  The anterior chamber was then filled with a 1% nonpreserved lidocaine solution with epinephrine.  This was followed by Viscoat to deepen the chamber.  A small fornix-based peritomy was performed superiorly.  Next, a single iris hook was placed through the limbus superiorly.  A 2.4-mm keratome blade was then used to make a clear corneal incision over the iris hook.  A bent cystotome needle and Utrata forceps were used to create a continuous tear capsulotomy.  Hydrodissection was performed using balanced salt solution on a fine cannula.  The lens nucleus was then removed using phacoemulsification in a quadrant cracking technique.  The cortical material was then removed with irrigation and aspiration.  The capsular bag and anterior chamber were refilled with Provisc.  The wound was widened to approximately 3 mm and a posterior chamber intraocular lens was placed into the capsular bag without difficulty  using an [employer] lens injecting system.  A single 10-0 nylon suture was then used to close the incision as well as stromal hydration.  The Provisc was removed from the anterior chamber and capsular bag with irrigation and aspiration.  At this point, the wounds were tested for leak, which were negative.  The anterior chamber remained deep and stable.  The patient tolerated the procedure well.  There were no operative complications, and she awoke from general anesthesia without problem.  No surgical specimens.  Prosthetic device used is a Lenstec posterior chamber lens, model Softec HD, power of 14.0, serial number is [id number].          ______________________________ Susanne Greenhouse, MD     KEH/MEDQ  D:  12/02/2011  T:  12/02/2011  Job:  [id number]

## 2011-12-03 NOTE — Addendum Note (Signed)
Addendum  created 12/03/11 1714 by Rumi Kolodziej   Modules edited:Charting, Inpatient Notes    

## 2011-12-03 NOTE — Addendum Note (Signed)
Addendum  created 12/03/11 1714 by Glynn Octave   Modules edited:Charting, Inpatient Notes

## 2011-12-10 ENCOUNTER — Encounter (HOSPITAL_COMMUNITY): Payer: Self-pay | Admitting: Ophthalmology

## 2012-05-17 ENCOUNTER — Ambulatory Visit (HOSPITAL_COMMUNITY): Payer: BC Managed Care – PPO | Admitting: Specialist

## 2013-04-24 ENCOUNTER — Encounter: Payer: Self-pay | Admitting: *Deleted

## 2013-05-25 ENCOUNTER — Ambulatory Visit (INDEPENDENT_AMBULATORY_CARE_PROVIDER_SITE_OTHER): Payer: BC Managed Care – PPO | Admitting: Internal Medicine

## 2013-05-25 ENCOUNTER — Encounter: Payer: Self-pay | Admitting: Internal Medicine

## 2013-05-25 VITALS — BP 120/80 | HR 68 | Ht 65.0 in | Wt 171.0 lb

## 2013-05-25 DIAGNOSIS — E785 Hyperlipidemia, unspecified: Secondary | ICD-10-CM

## 2013-05-25 DIAGNOSIS — R0789 Other chest pain: Secondary | ICD-10-CM | POA: Insufficient documentation

## 2013-05-25 DIAGNOSIS — I10 Essential (primary) hypertension: Secondary | ICD-10-CM

## 2013-05-25 DIAGNOSIS — Z79899 Other long term (current) drug therapy: Secondary | ICD-10-CM

## 2013-05-25 MED ORDER — OLMESARTAN MEDOXOMIL 40 MG PO TABS: 40.0000 mg | ORAL_TABLET | Freq: Every day | ORAL | Status: DC

## 2013-05-25 MED ORDER — SIMVASTATIN 40 MG PO TABS: 40.0000 mg | ORAL_TABLET | Freq: Every day | ORAL | Status: DC

## 2013-05-25 NOTE — Patient Instructions (Addendum)
Your physician recommends that you schedule a follow-up appointment in: in 1 year Your physician recommends that you return for lab work in:  CMP and LIPIDS

## 2013-05-25 NOTE — Progress Notes (Signed)
OFFICE NOTE  Chief Complaint:  Routine followup  Primary Care Physician: Katherine Ribas, MD  HPI:  Katherine Short  is a 62 year old female with a history of dyslipidemia, hypertension, and atypical chest pain. She underwent nuclear stress testing in March of 2011 which was negative. She has had problems with acid reflux, however, has not seen a GI doctor. Recently she has been having trouble swallowing solid foods which seem to get stuck in her chest. I recommended that she may need an EGD or evaluation for possible stricture. Otherwise she is under a significant amount of stress, both with her family and her daughter who was recently diagnosed with pericarditis and had a miscarriage. She has no specific complaints today. She did see Dr. Loreta Ave regarding her reflux symptoms. Since then she's been taking Prilosec daily. She had an EGD which demonstrated some gastritis but no stricture. She also had a colonoscopy which showed a polyp and is recommended for colonoscopies every 5 years. She denies any further chest pain or shortness of breath. Her main concern today is low back pain and recent constipation in the setting of taking Naprosyn.  PMHx:  Past Medical History  Diagnosis Date  . Hypertension   . Depression   . Headache(784.0)   . Dyslipidemia   . Chest pain 03/19/2010    nuclear study was negative  . GERD (gastroesophageal reflux disease)     Past Surgical History  Procedure Laterality Date  . Tubal ligation    . Colonoscopy    . Cataract extraction w/phaco  11/22/2011    Procedure: CATARACT EXTRACTION PHACO AND INTRAOCULAR LENS PLACEMENT (IOC);  Surgeon: Gemma Payor;  Location: [facility];  Service: Ophthalmology;  Laterality: Left;  CDE:7.76  . Cataract extraction w/phaco  12/02/2011    Procedure: CATARACT EXTRACTION PHACO AND INTRAOCULAR LENS PLACEMENT (IOC);  Surgeon: Gemma Payor;  Location: [facility];  Service: Ophthalmology;  Laterality: Right;  CDE=11.92    FAMHx:  Family  History  Problem Relation Age of Onset  . Cancer Mother 32    breast  . Diabetes Father     SOCHx:   reports that she has never smoked. She does not have any smokeless tobacco history on file. She reports that she does not drink alcohol or use illicit drugs.  ALLERGIES:  Allergies  Allergen Reactions  . Penicillins Itching and Rash         ROS: A comprehensive review of systems was negative except for: Gastrointestinal: positive for constipation Musculoskeletal: positive for back pain  HOME MEDS: Current Outpatient Prescriptions  Medication Sig Dispense Refill  . buPROPion (WELLBUTRIN XL) 150 MG 24 hr tablet Take 150-300 mg by mouth daily. Take two tablets every morning (300mg ) and take one tablet (150mg ) at bedtime      . Estradiol-Norethindrone Acet (ACTIVELLA) 0.5-0.1 MG per tablet Take 1 tablet by mouth daily.        Marland Kitchen ibuprofen (ADVIL,MOTRIN) 200 MG tablet Take 600 mg by mouth as needed. For headache pain       . olmesartan (BENICAR) 40 MG tablet Take 1 tablet (40 mg total) by mouth daily.  30 tablet  11  . omeprazole (PRILOSEC) 20 MG capsule Take 20 mg by mouth daily.      . simvastatin (ZOCOR) 40 MG tablet Take 1 tablet (40 mg total) by mouth daily.  30 tablet  11  . temazepam (RESTORIL) 30 MG capsule Take 30 mg by mouth at bedtime.        Marland Kitchen  topiramate (TOPAMAX) 50 MG tablet Take 50 mg by mouth daily.      . Vilazodone HCl (VIIBRYD) 20 MG TABS Take 20 mg by mouth at bedtime.         No current facility-administered medications for this visit.    LABS/IMAGING: No results found for this or any previous visit (from the past 48 hour(s)). No results found.  VITALS: BP 120/80  Pulse 68  Ht 5\' 5"  (1.651 m)  Wt 171 lb (77.565 kg)  BMI 28.46 kg/m2  EXAM: General appearance: alert and no distress Neck: no adenopathy, no carotid bruit, no JVD, supple, symmetrical, trachea midline and thyroid not enlarged, symmetric, no tenderness/mass/nodules Lungs: clear to  auscultation bilaterally Heart: regular rate and rhythm, S1, S2 normal, no murmur, click, rub or gallop Abdomen: soft, non-tender; bowel sounds normal; no masses,  no organomegaly Extremities: extremities normal, atraumatic, no cyanosis or edema Pulses: 2+ and symmetric Skin: Skin color, texture, turgor normal. No rashes or lesions Neurologic: Grossly normal  EKG: Normal sinus rhythm at 68  ASSESSMENT: 1. Atypical chest pain 2. Constipation 3. Low back pain 4. Hypertension-controlled  PLAN: 1.   Overall Ms. Katherine Short is doing fairly well. She had a low-risk stress test recently and does not complaining of any chest pain. Her main issue now is with low back pain and issues with constipation. I recommended the ibuprofen instead of Naprosyn to treat her back pain and over-the-counter stool softeners and cathartics for her constipation.  She denies any saddle anesthesia or other concerning lumbar neurologic complaints. Should she continue to have low back problems, consultation with orthopedics as recommended.  Plan to obtain a CMP and lipid profile today.  We can see her back annually or sooner if necessary.  [name], MD, [facility] Attending Cardiologist [facility]  Ahnaf Caponi C 05/25/2013, 1:31 PM

## 2013-05-29 LAB — COMPREHENSIVE METABOLIC PANEL
ALT: 14 U/L (ref 0–35)
Albumin: 4 g/dL (ref 3.5–5.2)
CO2: 26 mEq/L (ref 19–32)
Calcium: 9.3 mg/dL (ref 8.4–10.5)
Chloride: 109 mEq/L (ref 96–112)
Potassium: 4.8 mEq/L (ref 3.5–5.3)
Sodium: 142 mEq/L (ref 135–145)
Total Protein: 6.7 g/dL (ref 6.0–8.3)

## 2013-05-29 LAB — LIPID PANEL
Cholesterol: 166 mg/dL (ref 0–200)
VLDL: 15 mg/dL (ref 0–40)

## 2013-05-31 ENCOUNTER — Ambulatory Visit: Payer: BC Managed Care – PPO | Admitting: Internal Medicine

## 2014-05-24 ENCOUNTER — Other Ambulatory Visit: Payer: Self-pay | Admitting: Internal Medicine

## 2014-05-24 NOTE — Telephone Encounter (Signed)
Rx refill sent to patient pharmacy   

## 2014-07-03 ENCOUNTER — Other Ambulatory Visit: Payer: Self-pay | Admitting: Internal Medicine

## 2014-07-04 NOTE — Telephone Encounter (Signed)
Rx was sent to pharmacy electronically. 

## 2014-08-06 ENCOUNTER — Other Ambulatory Visit: Payer: Self-pay | Admitting: Internal Medicine

## 2014-08-06 NOTE — Telephone Encounter (Signed)
Rx was sent to pharmacy electronically. 

## 2014-08-14 ENCOUNTER — Ambulatory Visit (INDEPENDENT_AMBULATORY_CARE_PROVIDER_SITE_OTHER): Payer: BC Managed Care – PPO | Admitting: Internal Medicine

## 2014-08-14 ENCOUNTER — Encounter: Payer: Self-pay | Admitting: Internal Medicine

## 2014-08-14 VITALS — BP 122/70 | HR 97 | Ht 64.0 in | Wt 185.8 lb

## 2014-08-14 DIAGNOSIS — I10 Essential (primary) hypertension: Secondary | ICD-10-CM

## 2014-08-14 DIAGNOSIS — E785 Hyperlipidemia, unspecified: Secondary | ICD-10-CM

## 2014-08-14 DIAGNOSIS — R0789 Other chest pain: Secondary | ICD-10-CM

## 2014-08-14 MED ORDER — OLMESARTAN MEDOXOMIL 40 MG PO TABS: 40.0000 mg | ORAL_TABLET | Freq: Every day | ORAL | Status: DC

## 2014-08-14 MED ORDER — SIMVASTATIN 40 MG PO TABS: 40.0000 mg | ORAL_TABLET | Freq: Every day | ORAL | Status: DC

## 2014-08-14 NOTE — Patient Instructions (Signed)
Your physician wants you to follow-up in: 1 year. You will receive a reminder letter in the mail two months in advance. If you don't receive a letter, please call our office to schedule the follow-up appointment.  Please have fasting labs at your convenience (to check cholesterol) - we will call you with the results.

## 2014-08-15 LAB — LIPID PANEL
CHOL/HDL RATIO: 3.5 ratio
CHOLESTEROL: 180 mg/dL (ref 0–200)
HDL: 52 mg/dL (ref >39)
LDL Cholesterol: 107 mg/dL — ABNORMAL HIGH (ref 0–99)
Triglycerides: 106 mg/dL (ref <150)
VLDL: 21 mg/dL (ref 0–40)

## 2014-08-16 ENCOUNTER — Encounter: Payer: Self-pay | Admitting: Internal Medicine

## 2014-08-16 NOTE — Progress Notes (Signed)
OFFICE NOTE  Chief Complaint:  Routine followup  Primary Care Physician: Purvis Kilts, MD  HPI:  Katherine Short  is a 63 year old female with a history of dyslipidemia, hypertension, and atypical chest pain. She underwent nuclear stress testing in March of 2011 which was negative. She has had problems with acid reflux, however, has not seen a GI doctor. Recently she has been having trouble swallowing solid foods which seem to get stuck in her chest. I recommended that she may need an EGD or evaluation for possible stricture. Otherwise she is under a significant amount of stress, both with her family and her daughter who was recently diagnosed with pericarditis and had a miscarriage. She has no specific complaints today. She did see Dr. Collene Mares regarding her reflux symptoms. Since then she's been taking Prilosec daily. She had an EGD which demonstrated some gastritis but no stricture. She also had a colonoscopy which showed a polyp and is recommended for colonoscopies every 5 years. She denies any further chest pain or shortness of breath. Her main concern today is low back pain and recent constipation in the setting of taking Naprosyn.  Ms. Boston returns for followup today. She occasionally gets some lightheadedness but otherwise is doing well. Blood pressure is well controlled.  PMHx:  Past Medical History  Diagnosis Date  . Hypertension   . Depression   . Headache(784.0)   . Dyslipidemia   . Chest pain 03/19/2010    nuclear study was negative  . GERD (gastroesophageal reflux disease)     Past Surgical History  Procedure Laterality Date  . Tubal ligation    . Colonoscopy    . Cataract extraction w/phaco  11/22/2011    Procedure: CATARACT EXTRACTION PHACO AND INTRAOCULAR LENS PLACEMENT (IOC);  Surgeon: Tonny Branch;  Location: [facility];  Service: Ophthalmology;  Laterality: Left;  CDE:7.76  . Cataract extraction w/phaco  12/02/2011    Procedure: CATARACT EXTRACTION PHACO  AND INTRAOCULAR LENS PLACEMENT (IOC);  Surgeon: Tonny Branch;  Location: [facility];  Service: Ophthalmology;  Laterality: Right;  CDE=11.92    FAMHx:  Family History  Problem Relation Age of Onset  . Cancer Mother 64    breast  . Diabetes Father     SOCHx:   reports that she has never smoked. She does not have any smokeless tobacco history on file. She reports that she does not drink alcohol or use illicit drugs.  ALLERGIES:  Allergies  Allergen Reactions  . Penicillins Itching and Rash         ROS: A comprehensive review of systems was negative except for: Neurological: positive for dizziness  HOME MEDS: Current Outpatient Prescriptions  Medication Sig Dispense Refill  . acyclovir (ZOVIRAX) 200 MG capsule Take 200 mg by mouth 5 (five) times daily.      Marland Kitchen buPROPion (WELLBUTRIN XL) 150 MG 24 hr tablet Take 150-300 mg by mouth daily. Take two tablets every morning (300mg ) and take one tablet (150mg ) at bedtime      . Estradiol-Norethindrone Acet (ACTIVELLA) 0.5-0.1 MG per tablet Take 1 tablet by mouth daily.        Marland Kitchen ibuprofen (ADVIL,MOTRIN) 200 MG tablet Take 600 mg by mouth as needed. For headache pain       . olmesartan (BENICAR) 40 MG tablet Take 1 tablet (40 mg total) by mouth daily.  90 tablet  3  . simvastatin (ZOCOR) 40 MG tablet Take 1 tablet (40 mg total) by mouth daily.  90 tablet  3  .  temazepam (RESTORIL) 30 MG capsule Take 30 mg by mouth at bedtime.        . Vilazodone HCl (VIIBRYD) 20 MG TABS Take 40 mg by mouth at bedtime.        No current facility-administered medications for this visit.    LABS/IMAGING: No results found for this or any previous visit (from the past 48 hour(s)). No results found.  VITALS: BP 122/70  Pulse 97  Ht 5\' 4"  (1.626 m)  Wt 185 lb 12.8 oz (84.278 kg)  BMI 31.88 kg/m2  EXAM: General appearance: alert and no distress Neck: no adenopathy, no carotid bruit, no JVD, supple, symmetrical, trachea midline and thyroid not enlarged,  symmetric, no tenderness/mass/nodules Lungs: clear to auscultation bilaterally Heart: regular rate and rhythm, S1, S2 normal, no murmur, click, rub or gallop Abdomen: soft, non-tender; bowel sounds normal; no masses,  no organomegaly Extremities: extremities normal, atraumatic, no cyanosis or edema Pulses: 2+ and symmetric Skin: Skin color, texture, turgor normal. No rashes or lesions Neurologic: Grossly normal  EKG: Normal sinus rhythm at 97  ASSESSMENT: 1. Atypical chest pain - resolved 2. Constipation 3. Low back pain 4. Hypertension-controlled  PLAN: 1.   Overall [name] is doing fairly well. She had a low-risk stress test recently and does not complaining of any chest pain. Her main issue now is with low back pain and issues with constipation. She occasionally gets some lightheadedness but otherwise is doing well. Her blood pressures well controlled. Plan to see her back annually or sooner as necessary.  Pixie Casino, MD, [facility] Attending Cardiologist [facility] [place] [facility] 08/16/2014, 5:41 PM

## 2014-08-19 ENCOUNTER — Encounter: Payer: Self-pay | Admitting: *Deleted

## 2014-08-22 ENCOUNTER — Telehealth: Payer: Self-pay | Admitting: Internal Medicine

## 2014-08-22 NOTE — Telephone Encounter (Signed)
Wants to know if her lab results are back from 08-15-14 please.

## 2014-08-22 NOTE — Telephone Encounter (Signed)
Returned call to patient no answer.LMTC. 

## 2014-08-22 NOTE — Telephone Encounter (Signed)
This should have gone to clinical pool.

## 2014-08-22 NOTE — Telephone Encounter (Signed)
Patient notified of lab results

## 2014-08-31 ENCOUNTER — Emergency Department (HOSPITAL_COMMUNITY)
Admission: EM | Admit: 2014-08-31 | Discharge: 2014-08-31 | Disposition: A | Discharge status: Home / Self Care | Payer: BC Managed Care – PPO | Attending: Emergency Medicine | Admitting: Emergency Medicine

## 2014-08-31 ENCOUNTER — Encounter (HOSPITAL_COMMUNITY): Payer: Self-pay | Admitting: Emergency Medicine

## 2014-08-31 DIAGNOSIS — Z9851 Tubal ligation status: Secondary | ICD-10-CM | POA: Insufficient documentation

## 2014-08-31 DIAGNOSIS — Z79899 Other long term (current) drug therapy: Secondary | ICD-10-CM | POA: Diagnosis not present

## 2014-08-31 DIAGNOSIS — I10 Essential (primary) hypertension: Secondary | ICD-10-CM | POA: Insufficient documentation

## 2014-08-31 DIAGNOSIS — F3289 Other specified depressive episodes: Secondary | ICD-10-CM | POA: Diagnosis not present

## 2014-08-31 DIAGNOSIS — F329 Major depressive disorder, single episode, unspecified: Secondary | ICD-10-CM | POA: Diagnosis not present

## 2014-08-31 DIAGNOSIS — Z8719 Personal history of other diseases of the digestive system: Secondary | ICD-10-CM | POA: Insufficient documentation

## 2014-08-31 DIAGNOSIS — Z880 Allergy status to penicillin: Secondary | ICD-10-CM | POA: Insufficient documentation

## 2014-08-31 DIAGNOSIS — E785 Hyperlipidemia, unspecified: Secondary | ICD-10-CM | POA: Diagnosis not present

## 2014-08-31 DIAGNOSIS — R42 Dizziness and giddiness: Secondary | ICD-10-CM | POA: Diagnosis present

## 2014-08-31 DIAGNOSIS — R51 Headache: Secondary | ICD-10-CM | POA: Insufficient documentation

## 2014-08-31 DIAGNOSIS — R209 Unspecified disturbances of skin sensation: Secondary | ICD-10-CM | POA: Diagnosis not present

## 2014-08-31 DIAGNOSIS — R519 Headache, unspecified: Secondary | ICD-10-CM

## 2014-08-31 MED ORDER — METOCLOPRAMIDE HCL 5 MG/ML IJ SOLN
10.0000 mg | Freq: Once | INTRAMUSCULAR | Status: AC
  Administered 2014-08-31: 10 mg via INTRAVENOUS
  Filled 2014-08-31: qty 2

## 2014-08-31 MED ORDER — MECLIZINE HCL 12.5 MG PO TABS
25.0000 mg | ORAL_TABLET | Freq: Once | ORAL | Status: AC
  Administered 2014-08-31: 25 mg via ORAL
  Filled 2014-08-31: qty 2

## 2014-08-31 MED ORDER — ONDANSETRON HCL 4 MG PO TABS: 4.0000 mg | ORAL_TABLET | Freq: Three times a day (TID) | ORAL | Status: DC | PRN

## 2014-08-31 MED ORDER — DIPHENHYDRAMINE HCL 50 MG/ML IJ SOLN
25.0000 mg | Freq: Once | INTRAMUSCULAR | Status: AC
Start: 2014-08-31 — End: 2014-08-31
  Administered 2014-08-31: 25 mg via INTRAVENOUS
  Filled 2014-08-31: qty 1

## 2014-08-31 MED ORDER — SODIUM CHLORIDE 0.9 % IV SOLN: 1000.0000 mL | INTRAVENOUS | Status: DC

## 2014-08-31 MED ORDER — SODIUM CHLORIDE 0.9 % IV SOLN
1000.0000 mL | Freq: Once | INTRAVENOUS | Status: AC
  Administered 2014-08-31: 1000 mL via INTRAVENOUS

## 2014-08-31 MED ORDER — MECLIZINE HCL 25 MG PO TABS: 25.0000 mg | ORAL_TABLET | Freq: Four times a day (QID) | ORAL | Status: DC | PRN

## 2014-08-31 NOTE — Discharge Instructions (Signed)
Drink plenty of fluids. Use the zofran for nausea and vomiting and use the meclizine for dizziness.  Recheck if you feel worse again.

## 2014-08-31 NOTE — ED Notes (Signed)
Dizziness since yesterday.

## 2014-08-31 NOTE — ED Notes (Signed)
Patient states vertigo began suddenly yesterday at work.  She has had HA today and has taken total of 7 ibuprofen.  States HA is better, but light makes her head hurt.  Reports mild nausea.

## 2014-08-31 NOTE — ED Provider Notes (Signed)
CSN: [id number]     Arrival date & time 08/31/14  1649 History   This chart was scribed for Janice Norrie, MD by Jeanell Sparrow, ED Scribe. This patient was seen in room [facility]19[facility]19 and the patient's care was started at 5:08 PM.   Chief Complaint  Patient presents with  . Dizziness   The history is provided by the patient. No language interpreter was used.   HPI Comments: Katherine Short is a 63 y.o. female who presents to the Emergency Department complaining of moderate constant room spinning dizziness that started yesterday afternoon with a sudden onset. She reports that she had a constant moderate headache in her frontal and top area that started at the same time. She states that she describes the pain as a "ringing" sensation and her head hurts to touch. She reports that standing exacerbates the dizziness and laying down provides some relief. She states that light and sound exacerbates her headache. Her husband reports that she has been avoiding ambulation, and she denies a feeling of she is falling. . She states that she has numbness in her fingers that is not unusual. She reports a hx of headaches with dizziness with the last episode being about 5 years ago. It was just like her symptoms today. This is the third episode she has had in the pat 5 years.  She denies any hx of headaches without dizziness. She states that she does not smoke or drink. She reports that she is currently retired. She has nausea without emesis.   OB/ GYN- Dr. Norris Cross PCP- Dr. Hilma Favors (hasn't been seeing recently)  Past Medical History  Diagnosis Date  . Hypertension   . Depression   . Headache(784.0)   . Dyslipidemia   . Chest pain 03/19/2010    nuclear study was negative  . GERD (gastroesophageal reflux disease)    Past Surgical History  Procedure Laterality Date  . Tubal ligation    . Colonoscopy    . Cataract extraction w/phaco  11/22/2011    Procedure: CATARACT EXTRACTION PHACO AND INTRAOCULAR LENS PLACEMENT  (IOC);  Surgeon: Tonny Branch;  Location: [facility];  Service: Ophthalmology;  Laterality: Left;  CDE:7.76  . Cataract extraction w/phaco  12/02/2011    Procedure: CATARACT EXTRACTION PHACO AND INTRAOCULAR LENS PLACEMENT (IOC);  Surgeon: Tonny Branch;  Location: [facility];  Service: Ophthalmology;  Laterality: Right;  CDE=11.92   Family History  Problem Relation Age of Onset  . Cancer Mother 49    breast  . Diabetes Father    History  Substance Use Topics  . Smoking status: Never Smoker   . Smokeless tobacco: Not on file  . Alcohol Use: No   Retired Lives at home  OB History   [place] Term Preterm Abortions TAB SAB Ect Mult Living                 Review of Systems  Gastrointestinal: Negative for nausea and vomiting.  Neurological: Positive for dizziness and headaches.  All other systems reviewed and are negative.   Allergies  Penicillins  Home Medications   Prior to Admission medications   Medication Sig Start Date End Date Taking? Authorizing Provider  acyclovir (ZOVIRAX) 200 MG capsule Take 200 mg by mouth 2 (two) times daily.    Yes Historical Provider, MD  buPROPion (WELLBUTRIN XL) 150 MG 24 hr tablet Take 150-300 mg by mouth daily. Take two tablets every morning (300mg ) and take one tablet (150mg ) at bedtime   Yes Historical  Provider, MD  Estradiol-Norethindrone Acet (ACTIVELLA) 0.5-0.1 MG per tablet Take 1 tablet by mouth daily.     Yes Historical Provider, MD  ibuprofen (ADVIL,MOTRIN) 200 MG tablet Take 600 mg by mouth as needed. For headache pain    Yes Historical Provider, MD  olmesartan (BENICAR) 40 MG tablet Take 1 tablet (40 mg total) by mouth daily. 08/14/14  Yes Pixie Casino, MD  simvastatin (ZOCOR) 40 MG tablet Take 1 tablet (40 mg total) by mouth daily. 08/14/14  Yes Pixie Casino, MD  temazepam (RESTORIL) 30 MG capsule Take 30 mg by mouth at bedtime.     Yes Historical Provider, MD  Vilazodone HCl (VIIBRYD) 20 MG TABS Take 40 mg by mouth at bedtime.    Yes  Historical Provider, MD   BP 152/79  Pulse 69  Temp(Src) 97.8 F (36.6 C) (Oral)  Resp 18  Ht 5\' 4"  (1.626 m)  Wt 180 lb (81.647 kg)  BMI 30.88 kg/m2  SpO2 100%  Vital signs normal   Physical Exam  Nursing note and vitals reviewed. Constitutional: She is oriented to person, place, and time. She appears well-developed and well-nourished.  Non-toxic appearance. She does not appear ill. No distress.  Appears distressed and uncomfortable. Has light sensitivity.   HENT:  Head: Normocephalic and atraumatic.  Right Ear: External ear normal.  Left Ear: External ear normal.  Nose: Nose normal. No mucosal edema or rhinorrhea.  Mouth/Throat: Oropharynx is clear and moist and mucous membranes are normal. No dental abscesses or uvula swelling.  Eyes: Conjunctivae and EOM are normal. Pupils are equal, round, and reactive to light.  Neck: Normal range of motion and full passive range of motion without pain. Neck supple.  Cardiovascular: Normal rate, regular rhythm and normal heart sounds.  Exam reveals no gallop and no friction rub.   No murmur heard. Pulmonary/Chest: Effort normal and breath sounds normal. No respiratory distress. She has no wheezes. She has no rhonchi. She has no rales. She exhibits no tenderness and no crepitus.  Abdominal: Soft. Normal appearance and bowel sounds are normal. She exhibits no distension. There is no tenderness. There is no rebound and no guarding.  Musculoskeletal: Normal range of motion. She exhibits no edema and no tenderness.  Moves all extremities well.   Neurological: She is alert and oriented to person, place, and time. She has normal strength. No cranial nerve deficit.  Skin: Skin is warm, dry and intact. No rash noted. No erythema. No pallor.  Psychiatric: She has a normal mood and affect. Her speech is normal and behavior is normal. Her mood appears not anxious.    ED Course  Procedures (including critical care time)  Medications  0.9 %  sodium  chloride infusion (1,000 mLs Intravenous New Bag/Given 08/31/14 1737)    Followed by  0.9 %  sodium chloride infusion (not administered)  metoCLOPramide (REGLAN) injection 10 mg (10 mg Intravenous Given 08/31/14 1738)  diphenhydrAMINE (BENADRYL) injection 25 mg (25 mg Intravenous Given 08/31/14 1737)  meclizine (ANTIVERT) tablet 25 mg (25 mg Oral Given 08/31/14 1738)    DIAGNOSTIC STUDIES: Oxygen Saturation is 100% on RA, normal by my interpretation.    COORDINATION OF CARE: 5:12 PM- Pt advised of plan for treatment which includes medication .  6:32 PM- Pt states that headache and dizziness are almost gone. Pt agrees with treatment plan.   Labs Review  No results found.    Imaging Review No results found.   EKG Interpretation None  MDM   Final diagnoses:  Headache, unspecified headache type  Vertigo     New Prescriptions   MECLIZINE (ANTIVERT) 25 MG TABLET    Take 1 tablet (25 mg total) by mouth 4 (four) times daily as needed for dizziness.   ONDANSETRON (ZOFRAN) 4 MG TABLET    Take 1 tablet (4 mg total) by mouth every 8 (eight) hours as needed for nausea or vomiting.    Plan discharge     I personally performed the services described in this documentation, which was scribed in my presence. The recorded information has been reviewed and considered.  Rolland Porter, MD, FACEP     Janice Norrie, MD 08/31/14 [phone]

## 2015-05-20 ENCOUNTER — Other Ambulatory Visit: Payer: Self-pay | Admitting: Internal Medicine

## 2015-05-21 NOTE — Telephone Encounter (Signed)
Rx(s) sent to pharmacy electronically.  

## 2016-10-06 DIAGNOSIS — E782 Mixed hyperlipidemia: Secondary | ICD-10-CM | POA: Diagnosis not present

## 2016-10-06 DIAGNOSIS — I10 Essential (primary) hypertension: Secondary | ICD-10-CM | POA: Diagnosis not present

## 2016-10-06 DIAGNOSIS — R7301 Impaired fasting glucose: Secondary | ICD-10-CM | POA: Diagnosis not present

## 2016-10-08 DIAGNOSIS — G629 Polyneuropathy, unspecified: Secondary | ICD-10-CM | POA: Diagnosis not present

## 2016-10-08 DIAGNOSIS — E6609 Other obesity due to excess calories: Secondary | ICD-10-CM | POA: Diagnosis not present

## 2016-10-08 DIAGNOSIS — M542 Cervicalgia: Secondary | ICD-10-CM | POA: Diagnosis not present

## 2016-10-08 DIAGNOSIS — Z0000 Encounter for general adult medical examination without abnormal findings: Secondary | ICD-10-CM | POA: Diagnosis not present

## 2016-10-08 DIAGNOSIS — E782 Mixed hyperlipidemia: Secondary | ICD-10-CM | POA: Diagnosis not present

## 2016-10-08 DIAGNOSIS — R7301 Impaired fasting glucose: Secondary | ICD-10-CM | POA: Diagnosis not present

## 2016-10-08 DIAGNOSIS — Z6830 Body mass index (BMI) 30.0-30.9, adult: Secondary | ICD-10-CM | POA: Diagnosis not present

## 2016-10-08 DIAGNOSIS — R69 Illness, unspecified: Secondary | ICD-10-CM | POA: Diagnosis not present

## 2016-10-08 DIAGNOSIS — I10 Essential (primary) hypertension: Secondary | ICD-10-CM | POA: Diagnosis not present

## 2016-11-30 DIAGNOSIS — L570 Actinic keratosis: Secondary | ICD-10-CM | POA: Diagnosis not present

## 2016-11-30 DIAGNOSIS — L821 Other seborrheic keratosis: Secondary | ICD-10-CM | POA: Diagnosis not present

## 2016-11-30 DIAGNOSIS — X32XXXD Exposure to sunlight, subsequent encounter: Secondary | ICD-10-CM | POA: Diagnosis not present

## 2016-11-30 DIAGNOSIS — Z1283 Encounter for screening for malignant neoplasm of skin: Secondary | ICD-10-CM | POA: Diagnosis not present

## 2016-12-10 DIAGNOSIS — J0100 Acute maxillary sinusitis, unspecified: Secondary | ICD-10-CM | POA: Diagnosis not present

## 2016-12-10 DIAGNOSIS — J069 Acute upper respiratory infection, unspecified: Secondary | ICD-10-CM | POA: Diagnosis not present

## 2016-12-10 DIAGNOSIS — J209 Acute bronchitis, unspecified: Secondary | ICD-10-CM | POA: Diagnosis not present

## 2017-03-21 DIAGNOSIS — G4700 Insomnia, unspecified: Secondary | ICD-10-CM | POA: Diagnosis not present

## 2017-03-21 DIAGNOSIS — E785 Hyperlipidemia, unspecified: Secondary | ICD-10-CM | POA: Diagnosis not present

## 2017-03-21 DIAGNOSIS — N959 Unspecified menopausal and perimenopausal disorder: Secondary | ICD-10-CM | POA: Diagnosis not present

## 2017-03-21 DIAGNOSIS — Z6828 Body mass index (BMI) 28.0-28.9, adult: Secondary | ICD-10-CM | POA: Diagnosis not present

## 2017-03-21 DIAGNOSIS — E6609 Other obesity due to excess calories: Secondary | ICD-10-CM | POA: Diagnosis not present

## 2017-03-21 DIAGNOSIS — G9009 Other idiopathic peripheral autonomic neuropathy: Secondary | ICD-10-CM | POA: Diagnosis not present

## 2017-03-21 DIAGNOSIS — I10 Essential (primary) hypertension: Secondary | ICD-10-CM | POA: Diagnosis not present

## 2017-03-21 DIAGNOSIS — R69 Illness, unspecified: Secondary | ICD-10-CM | POA: Diagnosis not present

## 2017-03-21 DIAGNOSIS — Z0000 Encounter for general adult medical examination without abnormal findings: Secondary | ICD-10-CM | POA: Diagnosis not present

## 2017-03-21 DIAGNOSIS — B009 Herpesviral infection, unspecified: Secondary | ICD-10-CM | POA: Diagnosis not present

## 2017-03-23 DIAGNOSIS — Z6829 Body mass index (BMI) 29.0-29.9, adult: Secondary | ICD-10-CM | POA: Diagnosis not present

## 2017-03-23 DIAGNOSIS — Z124 Encounter for screening for malignant neoplasm of cervix: Secondary | ICD-10-CM | POA: Diagnosis not present

## 2017-03-23 DIAGNOSIS — Z1231 Encounter for screening mammogram for malignant neoplasm of breast: Secondary | ICD-10-CM | POA: Diagnosis not present

## 2017-04-06 DIAGNOSIS — R7301 Impaired fasting glucose: Secondary | ICD-10-CM | POA: Diagnosis not present

## 2017-04-06 DIAGNOSIS — E782 Mixed hyperlipidemia: Secondary | ICD-10-CM | POA: Diagnosis not present

## 2017-04-06 DIAGNOSIS — I10 Essential (primary) hypertension: Secondary | ICD-10-CM | POA: Diagnosis not present

## 2017-04-08 DIAGNOSIS — E6609 Other obesity due to excess calories: Secondary | ICD-10-CM | POA: Diagnosis not present

## 2017-04-08 DIAGNOSIS — G629 Polyneuropathy, unspecified: Secondary | ICD-10-CM | POA: Diagnosis not present

## 2017-04-08 DIAGNOSIS — Z23 Encounter for immunization: Secondary | ICD-10-CM | POA: Diagnosis not present

## 2017-04-08 DIAGNOSIS — M542 Cervicalgia: Secondary | ICD-10-CM | POA: Diagnosis not present

## 2017-04-08 DIAGNOSIS — Z6829 Body mass index (BMI) 29.0-29.9, adult: Secondary | ICD-10-CM | POA: Diagnosis not present

## 2017-04-08 DIAGNOSIS — R69 Illness, unspecified: Secondary | ICD-10-CM | POA: Diagnosis not present

## 2017-04-08 DIAGNOSIS — I10 Essential (primary) hypertension: Secondary | ICD-10-CM | POA: Diagnosis not present

## 2017-04-08 DIAGNOSIS — E782 Mixed hyperlipidemia: Secondary | ICD-10-CM | POA: Diagnosis not present

## 2017-04-08 DIAGNOSIS — R7301 Impaired fasting glucose: Secondary | ICD-10-CM | POA: Diagnosis not present

## 2017-04-14 ENCOUNTER — Other Ambulatory Visit: Payer: Self-pay | Admitting: Obstetrics and Gynecology

## 2017-04-14 DIAGNOSIS — Z803 Family history of malignant neoplasm of breast: Secondary | ICD-10-CM

## 2017-04-16 DIAGNOSIS — R69 Illness, unspecified: Secondary | ICD-10-CM | POA: Diagnosis not present

## 2017-04-28 ENCOUNTER — Ambulatory Visit
Admission: RE | Admit: 2017-04-28 | Discharge: 2017-04-28 | Disposition: A | Discharge status: Home / Self Care | Payer: Medicare HMO | Source: Ambulatory Visit | Attending: Obstetrics and Gynecology | Admitting: Obstetrics and Gynecology

## 2017-04-28 ENCOUNTER — Other Ambulatory Visit: Payer: Self-pay | Admitting: Obstetrics and Gynecology

## 2017-04-28 DIAGNOSIS — N63 Unspecified lump in unspecified breast: Secondary | ICD-10-CM

## 2017-04-28 DIAGNOSIS — R9389 Abnormal findings on diagnostic imaging of other specified body structures: Secondary | ICD-10-CM

## 2017-04-28 DIAGNOSIS — N6324 Unspecified lump in the left breast, lower inner quadrant: Secondary | ICD-10-CM | POA: Diagnosis not present

## 2017-04-28 DIAGNOSIS — Z803 Family history of malignant neoplasm of breast: Secondary | ICD-10-CM

## 2017-04-28 MED ORDER — GADOBENATE DIMEGLUMINE 529 MG/ML IV SOLN
16.0000 mL | Freq: Once | INTRAVENOUS | Status: AC | PRN
  Administered 2017-04-28: 16 mL via INTRAVENOUS

## 2017-04-29 ENCOUNTER — Ambulatory Visit
Admission: RE | Admit: 2017-04-29 | Discharge: 2017-04-29 | Disposition: A | Discharge status: Home / Self Care | Payer: Medicare HMO | Source: Ambulatory Visit | Attending: Obstetrics and Gynecology | Admitting: Obstetrics and Gynecology

## 2017-04-29 ENCOUNTER — Other Ambulatory Visit: Payer: Self-pay | Admitting: Obstetrics and Gynecology

## 2017-04-29 DIAGNOSIS — N63 Unspecified lump in unspecified breast: Secondary | ICD-10-CM

## 2017-04-29 DIAGNOSIS — R9389 Abnormal findings on diagnostic imaging of other specified body structures: Secondary | ICD-10-CM

## 2017-04-29 DIAGNOSIS — R599 Enlarged lymph nodes, unspecified: Secondary | ICD-10-CM

## 2017-04-29 DIAGNOSIS — D242 Benign neoplasm of left breast: Secondary | ICD-10-CM | POA: Diagnosis not present

## 2017-04-29 DIAGNOSIS — N6489 Other specified disorders of breast: Secondary | ICD-10-CM | POA: Diagnosis not present

## 2017-04-29 DIAGNOSIS — N6012 Diffuse cystic mastopathy of left breast: Secondary | ICD-10-CM | POA: Diagnosis not present

## 2017-04-29 DIAGNOSIS — N6321 Unspecified lump in the left breast, upper outer quadrant: Secondary | ICD-10-CM | POA: Diagnosis not present

## 2017-04-29 DIAGNOSIS — R590 Localized enlarged lymph nodes: Secondary | ICD-10-CM | POA: Diagnosis not present

## 2017-04-29 DIAGNOSIS — N6322 Unspecified lump in the left breast, upper inner quadrant: Secondary | ICD-10-CM | POA: Diagnosis not present

## 2017-05-02 ENCOUNTER — Other Ambulatory Visit: Payer: Self-pay | Admitting: Obstetrics and Gynecology

## 2017-05-02 DIAGNOSIS — R928 Other abnormal and inconclusive findings on diagnostic imaging of breast: Secondary | ICD-10-CM

## 2017-05-06 ENCOUNTER — Ambulatory Visit
Admission: RE | Admit: 2017-05-06 | Discharge: 2017-05-06 | Disposition: A | Discharge status: Home / Self Care | Payer: Medicare HMO | Source: Ambulatory Visit | Attending: Obstetrics and Gynecology | Admitting: Obstetrics and Gynecology

## 2017-05-06 DIAGNOSIS — N6489 Other specified disorders of breast: Secondary | ICD-10-CM | POA: Diagnosis not present

## 2017-05-06 DIAGNOSIS — R928 Other abnormal and inconclusive findings on diagnostic imaging of breast: Secondary | ICD-10-CM

## 2017-05-06 DIAGNOSIS — N6012 Diffuse cystic mastopathy of left breast: Secondary | ICD-10-CM | POA: Diagnosis not present

## 2017-05-06 MED ORDER — GADOBENATE DIMEGLUMINE 529 MG/ML IV SOLN
16.0000 mL | Freq: Once | INTRAVENOUS | Status: AC | PRN
  Administered 2017-05-06: 16 mL via INTRAVENOUS

## 2017-06-06 ENCOUNTER — Other Ambulatory Visit: Payer: Self-pay | Admitting: General Surgery

## 2017-06-06 ENCOUNTER — Ambulatory Visit: Payer: Self-pay | Admitting: General Surgery

## 2017-06-06 DIAGNOSIS — D242 Benign neoplasm of left breast: Secondary | ICD-10-CM | POA: Diagnosis not present

## 2017-07-01 ENCOUNTER — Encounter (HOSPITAL_COMMUNITY): Payer: Self-pay

## 2017-07-01 ENCOUNTER — Other Ambulatory Visit (HOSPITAL_COMMUNITY): Payer: Self-pay

## 2017-07-01 NOTE — Pre-Procedure Instructions (Signed)
    Katherine Short  07/01/2017      [id number] - [place], [place] - [place] [address] [place] [address] Phone: [phone] Fax: [phone]  [facility] [address], [place] - [place] [address] [place] [address] [place] [address] Phone: [phone] Fax: [phone]   Your procedure is scheduled on Thursday, July 07, 2017 at 7:30 AM.   Report to [facility] Entrance "A" Admitting Office at 5:30 AM.   Call this number if you have problems the morning of surgery: [phone]   Questions prior to day of surgery, please call [phone] between 8 & 4 PM.   Remember:  Do not eat food or drink liquids after midnight Wednesday, 07/06/17.  Take these medicines the morning of surgery with A SIP OF WATER: Acyclovir (Zovirax), Buproprion (Wellbutrin), Gabapentin (Neurontin)  Stop NSAIDS (Ibuprofen, Aleve, etc) as of today. Do not use Aspirin products prior to surgery.  Drink "Boost" drink 2 hours prior to arrival to the hospital. Drink it at 3:30 AM.   Do not wear jewelry, make-up or nail polish.  Do not wear lotions, powders, perfumes or deodorant.  Do not shave 48 hours prior to surgery.  Men may shave face and neck.  Do not bring valuables to the hospital.  [facility] is not responsible for any belongings or valuables.  Contacts, dentures or bridgework may not be worn into surgery.  Leave your suitcase in the car.  After surgery it may be brought to your room.  For patients admitted to the hospital, discharge time will be determined by your treatment team.  Patients discharged the day of surgery will not be allowed to drive home.   Special instructions:  See "Preparing for Surgery" Instruction sheet.  Please read over the fact sheets that you were given.

## 2017-07-04 ENCOUNTER — Encounter (HOSPITAL_COMMUNITY)
Admission: RE | Admit: 2017-07-04 | Discharge: 2017-07-04 | Disposition: A | Discharge status: Home / Self Care | Payer: Medicare HMO | Source: Ambulatory Visit | Attending: General Surgery | Admitting: General Surgery

## 2017-07-04 ENCOUNTER — Encounter (HOSPITAL_COMMUNITY): Payer: Self-pay

## 2017-07-04 DIAGNOSIS — F329 Major depressive disorder, single episode, unspecified: Secondary | ICD-10-CM | POA: Diagnosis not present

## 2017-07-04 DIAGNOSIS — N6012 Diffuse cystic mastopathy of left breast: Secondary | ICD-10-CM | POA: Diagnosis not present

## 2017-07-04 DIAGNOSIS — R69 Illness, unspecified: Secondary | ICD-10-CM | POA: Diagnosis not present

## 2017-07-04 DIAGNOSIS — D242 Benign neoplasm of left breast: Secondary | ICD-10-CM | POA: Diagnosis not present

## 2017-07-04 DIAGNOSIS — Z86010 Personal history of colonic polyps: Secondary | ICD-10-CM | POA: Diagnosis not present

## 2017-07-04 DIAGNOSIS — Z880 Allergy status to penicillin: Secondary | ICD-10-CM | POA: Diagnosis not present

## 2017-07-04 DIAGNOSIS — I10 Essential (primary) hypertension: Secondary | ICD-10-CM | POA: Diagnosis not present

## 2017-07-04 DIAGNOSIS — F419 Anxiety disorder, unspecified: Secondary | ICD-10-CM | POA: Diagnosis not present

## 2017-07-04 DIAGNOSIS — Z803 Family history of malignant neoplasm of breast: Secondary | ICD-10-CM | POA: Diagnosis not present

## 2017-07-04 DIAGNOSIS — Z87891 Personal history of nicotine dependence: Secondary | ICD-10-CM | POA: Diagnosis not present

## 2017-07-04 DIAGNOSIS — Z79899 Other long term (current) drug therapy: Secondary | ICD-10-CM | POA: Diagnosis not present

## 2017-07-04 DIAGNOSIS — Z801 Family history of malignant neoplasm of trachea, bronchus and lung: Secondary | ICD-10-CM | POA: Diagnosis not present

## 2017-07-04 DIAGNOSIS — N6092 Unspecified benign mammary dysplasia of left breast: Secondary | ICD-10-CM | POA: Diagnosis not present

## 2017-07-04 LAB — BASIC METABOLIC PANEL
Anion gap: 8 (ref 5–15)
BUN: 12 mg/dL (ref 6–20)
CHLORIDE: 106 mmol/L (ref 101–111)
CO2: 29 mmol/L (ref 22–32)
CREATININE: 0.9 mg/dL (ref 0.44–1.00)
Calcium: 9.4 mg/dL (ref 8.9–10.3)
GFR calc Af Amer: >60 mL/min (ref >60)
GFR calc non Af Amer: >60 mL/min (ref >60)
Glucose, Bld: 106 mg/dL — ABNORMAL HIGH (ref 65–99)
Potassium: 4.8 mmol/L (ref 3.5–5.1)
SODIUM: 143 mmol/L (ref 135–145)

## 2017-07-04 LAB — CBC
HCT: 46.1 % — ABNORMAL HIGH (ref 36.0–46.0)
Hemoglobin: 14.8 g/dL (ref 12.0–15.0)
MCH: 30.3 pg (ref 26.0–34.0)
MCHC: 32.1 g/dL (ref 30.0–36.0)
MCV: 94.3 fL (ref 78.0–100.0)
PLATELETS: 290 10*3/uL (ref 150–400)
RBC: 4.89 MIL/uL (ref 3.87–5.11)
RDW: 13.8 % (ref 11.5–15.5)
WBC: 10.2 10*3/uL (ref 4.0–10.5)

## 2017-07-04 NOTE — Progress Notes (Signed)
PCP -  Cardiologist - Dr. Debara Pickett  Chest x-ray - n/a EKG - 07/04/2017 Stress Test - 03/19/2010 ECHO - 03/19/2010  Cardiac Cath - patient denies  Sleep Study - patient denies   Patient denies shortness of breath, fever, cough and chest pain at PAT appointment   Patient verbalized understanding of instructions that were given to them at the PAT appointment. Patient was also instructed that they will need to review over the PAT instructions again at home before surgery.  Patient confirmed appointment on Wednesday to have radioactive seed placed.   Anesthesia review for abnormal EKG.

## 2017-07-05 NOTE — Progress Notes (Signed)
Anesthesia Chart Review:  Patient is a 66 year old female scheduled for L breast lumpectomy with radioactive seed localization on 07/07/2017 with Jovita Kussmaul, M.D.   - PCP is Delphina Cahill, MD - Used to see cardiologist Lyman Bishop, MD for atypical chest pain, HTN. Last office visit 08/16/14.   PMH includes: HTN, hyperlipidemia, GERD. Never smoker. BMI 29.5. S/p cataract extraction 12/02/11, 11/22/11.   Medications include: Olmesartan, simvastatin, contrave. Pt to hold contrave 2 days before surgery.   Preoperative labs reviewed.    EKG 07/04/17: NSR. Possible Left atrial enlargement. Inferior infarct, age undetermined. Appears stable when compared to 08/14/14 EKG.   Nuclear stress test 03/19/10:  1. Post-stress myocardial perfusion images show a normal pattern of perfusion in all regions. Post-stress LV is normal in size. No scintigraphic evidence of inducible myocardial ischemia 2. Post-stress EF 75%. Global LV systolic function normal. No significant wall motion abnormalities noted. 3. Exercise capacity 10 METs. Exercise capacity is normal. 4. Normal myocardial perfusion study. No significant ischemia demonstrated. This is low risk scan.  Echo 03/19/10:  1. LV normal in size and systolic function. Mild diastolic dysfunction. 2. All other chambers are normal in size and function. 3. No significant valvular abnormalities are seen. 4. There is no pericardial effusion.  If no changes, I anticipate pt can proceed with surgery as scheduled.   Willeen Cass, FNP-BC [facility]/Anesthesiology Phone: [phone] 07/05/2017 4:19 PM

## 2017-07-05 NOTE — Progress Notes (Signed)
Spoke with patient regarding her medication Contrave.  Per anesthesia, patient verbally confirmed to hold medication 2 days prior to her surgery.

## 2017-07-06 ENCOUNTER — Ambulatory Visit
Admission: RE | Admit: 2017-07-06 | Discharge: 2017-07-06 | Disposition: A | Discharge status: Home / Self Care | Payer: Medicare HMO | Source: Ambulatory Visit | Attending: General Surgery | Admitting: General Surgery

## 2017-07-06 DIAGNOSIS — D242 Benign neoplasm of left breast: Secondary | ICD-10-CM

## 2017-07-07 ENCOUNTER — Ambulatory Visit (HOSPITAL_BASED_OUTPATIENT_CLINIC_OR_DEPARTMENT_OTHER)
Admission: RE | Admit: 2017-07-07 | Discharge: 2017-07-07 | Disposition: A | Discharge status: Home / Self Care | Payer: Medicare HMO | Source: Ambulatory Visit | Attending: General Surgery | Admitting: General Surgery

## 2017-07-07 ENCOUNTER — Encounter (HOSPITAL_COMMUNITY): Payer: Self-pay | Admitting: *Deleted

## 2017-07-07 ENCOUNTER — Encounter (HOSPITAL_COMMUNITY)
Admission: RE | Disposition: A | Discharge status: Home / Self Care | Payer: Self-pay | Source: Ambulatory Visit | Attending: General Surgery

## 2017-07-07 ENCOUNTER — Ambulatory Visit: Payer: Self-pay | Admitting: Surgery

## 2017-07-07 ENCOUNTER — Ambulatory Visit (HOSPITAL_COMMUNITY): Payer: Medicare HMO | Admitting: Emergency Medicine

## 2017-07-07 ENCOUNTER — Ambulatory Visit (HOSPITAL_COMMUNITY): Payer: Medicare HMO | Admitting: Anesthesiology

## 2017-07-07 ENCOUNTER — Ambulatory Visit
Admission: RE | Admit: 2017-07-07 | Discharge: 2017-07-07 | Disposition: A | Discharge status: Home / Self Care | Payer: Medicare HMO | Source: Ambulatory Visit | Attending: General Surgery | Admitting: General Surgery

## 2017-07-07 DIAGNOSIS — Z86010 Personal history of colonic polyps: Secondary | ICD-10-CM | POA: Insufficient documentation

## 2017-07-07 DIAGNOSIS — N6092 Unspecified benign mammary dysplasia of left breast: Secondary | ICD-10-CM | POA: Diagnosis not present

## 2017-07-07 DIAGNOSIS — Z880 Allergy status to penicillin: Secondary | ICD-10-CM | POA: Insufficient documentation

## 2017-07-07 DIAGNOSIS — N6082 Other benign mammary dysplasias of left breast: Secondary | ICD-10-CM | POA: Diagnosis not present

## 2017-07-07 DIAGNOSIS — I10 Essential (primary) hypertension: Secondary | ICD-10-CM | POA: Insufficient documentation

## 2017-07-07 DIAGNOSIS — D242 Benign neoplasm of left breast: Secondary | ICD-10-CM

## 2017-07-07 DIAGNOSIS — Z801 Family history of malignant neoplasm of trachea, bronchus and lung: Secondary | ICD-10-CM | POA: Diagnosis not present

## 2017-07-07 DIAGNOSIS — R69 Illness, unspecified: Secondary | ICD-10-CM | POA: Diagnosis not present

## 2017-07-07 DIAGNOSIS — Z803 Family history of malignant neoplasm of breast: Secondary | ICD-10-CM | POA: Insufficient documentation

## 2017-07-07 DIAGNOSIS — Z79899 Other long term (current) drug therapy: Secondary | ICD-10-CM | POA: Insufficient documentation

## 2017-07-07 DIAGNOSIS — F329 Major depressive disorder, single episode, unspecified: Secondary | ICD-10-CM | POA: Insufficient documentation

## 2017-07-07 DIAGNOSIS — N6012 Diffuse cystic mastopathy of left breast: Secondary | ICD-10-CM | POA: Diagnosis not present

## 2017-07-07 DIAGNOSIS — F419 Anxiety disorder, unspecified: Secondary | ICD-10-CM | POA: Insufficient documentation

## 2017-07-07 DIAGNOSIS — R921 Mammographic calcification found on diagnostic imaging of breast: Secondary | ICD-10-CM | POA: Diagnosis not present

## 2017-07-07 DIAGNOSIS — R928 Other abnormal and inconclusive findings on diagnostic imaging of breast: Secondary | ICD-10-CM | POA: Diagnosis not present

## 2017-07-07 DIAGNOSIS — E785 Hyperlipidemia, unspecified: Secondary | ICD-10-CM | POA: Diagnosis not present

## 2017-07-07 DIAGNOSIS — Z87891 Personal history of nicotine dependence: Secondary | ICD-10-CM | POA: Insufficient documentation

## 2017-07-07 DIAGNOSIS — R0789 Other chest pain: Secondary | ICD-10-CM | POA: Diagnosis not present

## 2017-07-07 HISTORY — PX: BREAST LUMPECTOMY WITH RADIOACTIVE SEED LOCALIZATION: SHX6424

## 2017-07-07 SURGERY — BREAST LUMPECTOMY WITH RADIOACTIVE SEED LOCALIZATION
Anesthesia: General | Site: Breast | Laterality: Left

## 2017-07-07 MED ORDER — ONDANSETRON HCL 4 MG/2ML IJ SOLN
INTRAMUSCULAR | Status: DC | PRN
  Administered 2017-07-07: 4 mg via INTRAVENOUS

## 2017-07-07 MED ORDER — PHENYLEPHRINE HCL 10 MG/ML IJ SOLN
INTRAMUSCULAR | Status: DC | PRN
  Administered 2017-07-07: 40 ug via INTRAVENOUS
  Administered 2017-07-07: 80 ug via INTRAVENOUS

## 2017-07-07 MED ORDER — PROPOFOL 10 MG/ML IV BOLUS
INTRAVENOUS | Status: AC
  Filled 2017-07-07: qty 60

## 2017-07-07 MED ORDER — BUPIVACAINE HCL (PF) 0.25 % IJ SOLN
INTRAMUSCULAR | Status: AC
  Filled 2017-07-07: qty 30

## 2017-07-07 MED ORDER — OXYCODONE HCL 5 MG/5ML PO SOLN: 5.0000 mg | Freq: Once | ORAL | Status: DC | PRN

## 2017-07-07 MED ORDER — OXYCODONE HCL 5 MG PO TABS: 5.0000 mg | ORAL_TABLET | Freq: Once | ORAL | Status: DC | PRN

## 2017-07-07 MED ORDER — BUPIVACAINE-EPINEPHRINE (PF) 0.25% -1:200000 IJ SOLN
INTRAMUSCULAR | Status: AC
  Filled 2017-07-07: qty 30

## 2017-07-07 MED ORDER — CHLORHEXIDINE GLUCONATE CLOTH 2 % EX PADS: 6.0000 | MEDICATED_PAD | Freq: Once | CUTANEOUS | Status: DC

## 2017-07-07 MED ORDER — MIDAZOLAM HCL 2 MG/2ML IJ SOLN
INTRAMUSCULAR | Status: DC | PRN
  Administered 2017-07-07 (×2): 1 mg via INTRAVENOUS

## 2017-07-07 MED ORDER — EPHEDRINE SULFATE 50 MG/ML IJ SOLN
INTRAMUSCULAR | Status: DC | PRN
  Administered 2017-07-07 (×2): 10 mg via INTRAVENOUS

## 2017-07-07 MED ORDER — BUPIVACAINE-EPINEPHRINE (PF) 0.25% -1:200000 IJ SOLN
INTRAMUSCULAR | Status: DC | PRN
  Administered 2017-07-07: 20 mL via PERINEURAL

## 2017-07-07 MED ORDER — ACETAMINOPHEN 500 MG PO TABS
1000.0000 mg | ORAL_TABLET | ORAL | Status: AC
  Administered 2017-07-07: 1000 mg via ORAL
  Filled 2017-07-07: qty 2

## 2017-07-07 MED ORDER — HYDROCODONE-ACETAMINOPHEN 5-325 MG PO TABS: 1.0000 | ORAL_TABLET | ORAL | 0 refills | Status: DC | PRN

## 2017-07-07 MED ORDER — GABAPENTIN 300 MG PO CAPS
300.0000 mg | ORAL_CAPSULE | ORAL | Status: AC
  Administered 2017-07-07: 300 mg via ORAL
  Filled 2017-07-07: qty 1

## 2017-07-07 MED ORDER — FENTANYL CITRATE (PF) 100 MCG/2ML IJ SOLN: 25.0000 ug | INTRAMUSCULAR | Status: DC | PRN

## 2017-07-07 MED ORDER — MIDAZOLAM HCL 2 MG/2ML IJ SOLN
INTRAMUSCULAR | Status: AC
  Filled 2017-07-07: qty 2

## 2017-07-07 MED ORDER — ONDANSETRON HCL 4 MG/2ML IJ SOLN: 4.0000 mg | Freq: Once | INTRAMUSCULAR | Status: DC | PRN

## 2017-07-07 MED ORDER — VANCOMYCIN HCL IN DEXTROSE 1-5 GM/200ML-% IV SOLN
INTRAVENOUS | Status: AC
  Filled 2017-07-07: qty 200

## 2017-07-07 MED ORDER — FENTANYL CITRATE (PF) 100 MCG/2ML IJ SOLN
INTRAMUSCULAR | Status: DC | PRN
  Administered 2017-07-07: 50 ug via INTRAVENOUS

## 2017-07-07 MED ORDER — CELECOXIB 200 MG PO CAPS
400.0000 mg | ORAL_CAPSULE | ORAL | Status: AC
  Administered 2017-07-07: 400 mg via ORAL
  Filled 2017-07-07: qty 2

## 2017-07-07 MED ORDER — VANCOMYCIN HCL IN DEXTROSE 1-5 GM/200ML-% IV SOLN
1000.0000 mg | INTRAVENOUS | Status: AC
  Administered 2017-07-07: 700 mg via INTRAVENOUS
  Administered 2017-07-07: 1000 mg via INTRAVENOUS
  Filled 2017-07-07: qty 200

## 2017-07-07 MED ORDER — LIDOCAINE HCL (CARDIAC) 20 MG/ML IV SOLN
INTRAVENOUS | Status: DC | PRN
  Administered 2017-07-07: 60 mg via INTRAVENOUS

## 2017-07-07 MED ORDER — FENTANYL CITRATE (PF) 250 MCG/5ML IJ SOLN
INTRAMUSCULAR | Status: AC
  Filled 2017-07-07: qty 5

## 2017-07-07 MED ORDER — PROPOFOL 10 MG/ML IV BOLUS
INTRAVENOUS | Status: DC | PRN
  Administered 2017-07-07: 160 mg via INTRAVENOUS

## 2017-07-07 MED ORDER — LACTATED RINGERS IV SOLN
INTRAVENOUS | Status: DC | PRN
  Administered 2017-07-07: 07:00:00 via INTRAVENOUS

## 2017-07-07 SURGICAL SUPPLY — 42 items
ADH SKN CLS APL DERMABOND .7 (GAUZE/BANDAGES/DRESSINGS) ×1
APPLIER CLIP 9.375 MED OPEN (MISCELLANEOUS)
APR CLP MED 9.3 20 MLT OPN (MISCELLANEOUS)
BLADE SURG 15 STRL LF DISP TIS (BLADE) ×1 IMPLANT
BLADE SURG 15 STRL SS (BLADE) ×2
CANISTER SUC SOCK COL 7IN (MISCELLANEOUS) ×2 IMPLANT
CANISTER SUCT 1200ML W/VALVE (MISCELLANEOUS) ×2 IMPLANT
CHLORAPREP W/TINT 26ML (MISCELLANEOUS) ×2 IMPLANT
CLIP APPLIE 9.375 MED OPEN (MISCELLANEOUS) IMPLANT
COVER BACK TABLE 60X90IN (DRAPES) ×2 IMPLANT
COVER MAYO STAND STRL (DRAPES) ×2 IMPLANT
COVER PROBE W GEL 5X96 (DRAPES) ×2 IMPLANT
DECANTER SPIKE VIAL GLASS SM (MISCELLANEOUS) IMPLANT
DERMABOND ADVANCED (GAUZE/BANDAGES/DRESSINGS) ×1
DERMABOND ADVANCED .7 DNX12 (GAUZE/BANDAGES/DRESSINGS) ×1 IMPLANT
DEVICE DUBIN W/COMP PLATE 8390 (MISCELLANEOUS) ×2 IMPLANT
DRAPE LAPAROSCOPIC ABDOMINAL (DRAPES) ×2 IMPLANT
DRAPE UTILITY XL STRL (DRAPES) ×2 IMPLANT
ELECT COATED BLADE 2.86 ST (ELECTRODE) ×2 IMPLANT
ELECT REM PT RETURN 9FT ADLT (ELECTROSURGICAL) ×2
ELECTRODE REM PT RTRN 9FT ADLT (ELECTROSURGICAL) ×1 IMPLANT
GLOVE BIO SURGEON STRL SZ7.5 (GLOVE) ×4 IMPLANT
GOWN STRL REUS W/ TWL LRG LVL3 (GOWN DISPOSABLE) ×2 IMPLANT
GOWN STRL REUS W/TWL LRG LVL3 (GOWN DISPOSABLE) ×4
ILLUMINATOR WAVEGUIDE N/F (MISCELLANEOUS) IMPLANT
KIT MARKER MARGIN INK (KITS) ×2 IMPLANT
LIGHT WAVEGUIDE WIDE FLAT (MISCELLANEOUS) IMPLANT
NDL HYPO 25X1 1.5 SAFETY (NEEDLE) IMPLANT
NEEDLE HYPO 25X1 1.5 SAFETY (NEEDLE) ×2 IMPLANT
NS IRRIG 1000ML POUR BTL (IV SOLUTION) ×1 IMPLANT
PACK BASIN DAY SURGERY FS (CUSTOM PROCEDURE TRAY) ×2 IMPLANT
PENCIL BUTTON HOLSTER BLD 10FT (ELECTRODE) ×2 IMPLANT
SLEEVE SCD COMPRESS KNEE MED (MISCELLANEOUS) ×2 IMPLANT
SPONGE LAP 18X18 X RAY DECT (DISPOSABLE) ×2 IMPLANT
SUT MON AB 4-0 PC3 18 (SUTURE) ×1 IMPLANT
SUT SILK 2 0 SH (SUTURE) ×1 IMPLANT
SUT VICRYL 3-0 CR8 SH (SUTURE) ×2 IMPLANT
SYR CONTROL 10ML LL (SYRINGE) ×1 IMPLANT
TOWEL OR 17X24 6PK STRL BLUE (TOWEL DISPOSABLE) ×2 IMPLANT
TOWEL OR NON WOVEN STRL DISP B (DISPOSABLE) ×2 IMPLANT
TUBE CONNECTING 20X1/4 (TUBING) ×2 IMPLANT
YANKAUER SUCT BULB TIP NO VENT (SUCTIONS) ×1 IMPLANT

## 2017-07-07 NOTE — H&P (Signed)
Katherine Short  Location: [facility] Patient #: [id number] DOB: 04-Apr-1951 Married / Language: English / Race: White Female   History of Present Illness The patient is a 66 year old female who presents with a breast mass. We are asked to see the patient in consultation by Dr. Augustin Coupe to evaluate her for a papilloma of the left breast. The patient is a 65 year old white female who recently went for a routine screening mammogram. At that time she was found to have several abnormalities on the left side. Each of these was biopsied. One came back as a benign lymph node. The other 2 came back as benign fibrocystic disease. The fourth came back as an intraductal papilloma. She denied any breast pain or discharge from the nipple. She does have a history of breast cancer in her mother who died at the age of 21 and several maternal cousins. She also has a sister that is diagnosed with lung cancer. She does not smoke.   Past Surgical History Cataract Surgery  Bilateral. Cesarean Section - 1  Colon Polyp Removal - Colonoscopy   Diagnostic Studies History  Colonoscopy  1-5 years ago Mammogram  within last year Pap Smear  1-5 years ago  Allergies  Penicillins  Itching, Rash.  Medication History Acyclovir (200MG  Capsule, Oral) Active. BuPROPion HCl ER (SR) (150MG  Tablet ER 12HR, Oral) Active. Estradiol-Norethindrone Acet (0.5-0.1MG  Tablet, Oral) Active. Olmesartan Medoxomil (40MG  Tablet, Oral) Active. Simvastatin (40MG  Tablet, Oral) Active. Temazepam (30MG  Capsule, Oral) Active. Viibryd (40MG  Tablet, Oral) Active. Medications Reconciled  Social History  Alcohol use  Occasional alcohol use. No caffeine use  No drug use  Tobacco use  Former smoker.  Family History  Breast Cancer  Mother. Cancer  Daughter. Diabetes Mellitus  Father. Hypertension  Father, Mother. Thyroid problems  Mother.  Pregnancy / Birth History Age at menarche  70  years. Contraceptive History  Oral contraceptives. Gravida  3 Length (months) of breastfeeding  7-12 Maternal age  66-30 Para  2  Other Problems  Anxiety Disorder  Depression  High blood pressure     Review of Systems General Present- Night Sweats. Not Present- Appetite Loss, Chills, Fatigue, Fever, Weight Gain and Weight Loss. Skin Not Present- Change in Wart/Mole, Dryness, Hives, Jaundice, New Lesions, Non-Healing Wounds, Rash and Ulcer. HEENT Present- Ringing in the Ears. Not Present- Earache, Hearing Loss, Hoarseness, Nose Bleed, Oral Ulcers, Seasonal Allergies, Sinus Pain, Sore Throat, Visual Disturbances, Wears glasses/contact lenses and Yellow Eyes. Respiratory Not Present- Bloody sputum, Chronic Cough, Difficulty Breathing, Snoring and Wheezing. Breast Not Present- Breast Mass, Breast Pain, Nipple Discharge and Skin Changes. Cardiovascular Not Present- Chest Pain, Difficulty Breathing Lying Down, Leg Cramps, Palpitations, Rapid Heart Rate, Shortness of Breath and Swelling of Extremities. Gastrointestinal Not Present- Abdominal Pain, Bloating, Bloody Stool, Change in Bowel Habits, Chronic diarrhea, Constipation, Difficulty Swallowing, Excessive gas, Gets full quickly at meals, Hemorrhoids, Indigestion, Nausea, Rectal Pain and Vomiting. Female Genitourinary Not Present- Frequency, Nocturia, Painful Urination, Pelvic Pain and Urgency. Musculoskeletal Not Present- Back Pain, Joint Pain, Joint Stiffness, Muscle Pain, Muscle Weakness and Swelling of Extremities. Neurological Present- Tingling. Not Present- Decreased Memory, Fainting, Headaches, Numbness, Seizures, Tremor, Trouble walking and Weakness. Psychiatric Present- Anxiety and Depression. Not Present- Bipolar, Change in Sleep Pattern, Fearful and Frequent crying. Endocrine Present- Hot flashes. Not Present- Cold Intolerance, Excessive Hunger, Hair Changes, Heat Intolerance and New Diabetes. Hematology Not Present- Blood  Thinners, Easy Bruising, Excessive bleeding, Gland problems, HIV and Persistent Infections.  Vitals  Weight: 172  lb Height: 64in Body Surface Area: 1.83 m Body Mass Index: 29.52 kg/m  Temp.: 55F  Pulse: 80 (Regular)  BP: 142/84 (Sitting, Left Arm, Standard)       Physical Exam  General Mental Status-Alert. General Appearance-Consistent with stated age. Hydration-Well hydrated. Voice-Normal.  Head and Neck Head-normocephalic, atraumatic with no lesions or palpable masses. Trachea-midline. Thyroid Gland Characteristics - normal size and consistency.  Eye Eyeball - Bilateral-Extraocular movements intact. Sclera/Conjunctiva - Bilateral-No scleral icterus.  Chest and Lung Exam Chest and lung exam reveals -quiet, even and easy respiratory effort with no use of accessory muscles and on auscultation, normal breath sounds, no adventitious sounds and normal vocal resonance. Inspection Chest Wall - Normal. Back - normal.  Breast Note: There is no palpable mass in either breast. There is no palpable axillary, supraclavicular, or cervical lymphadenopathy.   Cardiovascular Cardiovascular examination reveals -normal heart sounds, regular rate and rhythm with no murmurs and normal pedal pulses bilaterally.  Abdomen Inspection Inspection of the abdomen reveals - No Hernias. Skin - Scar - no surgical scars. Palpation/Percussion Palpation and Percussion of the abdomen reveal - Soft, Non Tender, No Rebound tenderness, No Rigidity (guarding) and No hepatosplenomegaly. Auscultation Auscultation of the abdomen reveals - Bowel sounds normal.  Neurologic Neurologic evaluation reveals -alert and oriented x 3 with no impairment of recent or remote memory. Mental Status-Normal.  Musculoskeletal Normal Exam - Left-Upper Extremity Strength Normal and Lower Extremity Strength Normal. Normal Exam - Right-Upper Extremity Strength Normal and Lower  Extremity Strength Normal.  Lymphatic Head & Neck  General Head & Neck Lymphatics: Bilateral - Description - Normal. Axillary  General Axillary Region: Bilateral - Description - Normal. Tenderness - Non Tender. Femoral & Inguinal  Generalized Femoral & Inguinal Lymphatics: Bilateral - Description - Normal. Tenderness - Non Tender.    Assessment & Plan  INTRADUCTAL PAPILLOMA OF BREAST, LEFT (D24.2) Impression: The patient appears to have an intraductal papilloma of the left subareolar breast. Because of her family history she would very much like to have this area removed. I think this is a reasonable thing to do. I have discussed with her in detail the risks and benefits of the operation as well as some of the technical aspects and she understands and wishes to proceed. I will plan for a left breast radioactive seed localized lumpectomy Current Plans Pt Education - Breast Diseases: discussed with patient and provided information.

## 2017-07-07 NOTE — Progress Notes (Signed)
Pt updated re: clip placed at wrong site by radiologist and need for 2nd surgery.

## 2017-07-07 NOTE — Anesthesia Preprocedure Evaluation (Addendum)
Anesthesia Evaluation  Patient identified by MRN, date of birth, ID band  Reviewed: Allergy & Precautions, NPO status , Patient's Chart, lab work & pertinent test results  Airway Mallampati: II  TM Distance: >3 FB Neck ROM: Full    Dental  (+) Teeth Intact, Dental Advisory Given   Pulmonary    breath sounds clear to auscultation       Cardiovascular hypertension,  Rhythm:Regular     Neuro/Psych    GI/Hepatic   Endo/Other    Renal/GU      Musculoskeletal   Abdominal   Peds  Hematology   Anesthesia Other Findings   Reproductive/Obstetrics                             Anesthesia Physical Anesthesia Plan  ASA: III  Anesthesia Plan: General   Post-op Pain Management:  Regional for Post-op pain   Induction: Intravenous  PONV Risk Score and Plan: Ondansetron and Dexamethasone  Airway Management Planned: LMA  Additional Equipment:   Intra-op Plan:   Post-operative Plan:   Informed Consent: I have reviewed the patients History and Physical, chart, labs and discussed the procedure including the risks, benefits and alternatives for the proposed anesthesia with the patient or authorized representative who has indicated his/her understanding and acceptance.   Dental advisory given  Plan Discussed with: CRNA and Anesthesiologist  Anesthesia Plan Comments:         Anesthesia Quick Evaluation

## 2017-07-07 NOTE — Anesthesia Postprocedure Evaluation (Signed)
Anesthesia Post Note  Patient: CEOLA PARA  Procedure(s) Performed: Procedure(s) (LRB): LEFT BREAST LUMPECTOMY WITH RADIOACTIVE SEED LOCALIZATION (Left)     Patient location during evaluation: PACU Anesthesia Type: General Level of consciousness: awake, awake and alert and oriented Pain management: pain level controlled Vital Signs Assessment: post-procedure vital signs reviewed and stable Respiratory status: spontaneous breathing, respiratory function stable and nonlabored ventilation Cardiovascular status: blood pressure returned to baseline Anesthetic complications: no    Last Vitals:  Vitals:   07/07/17 0908 07/07/17 0919  BP: 115/60 134/76  Pulse: 69 72  Resp: 17 16  Temp: 36.4 C     Last Pain:  Vitals:   07/07/17 0601  TempSrc: Oral                 Reise Gladney COKER

## 2017-07-07 NOTE — Interval H&P Note (Signed)
History and Physical Interval Note:  07/07/2017 7:13 AM  Katherine Short  has presented today for surgery, with the diagnosis of left breast papilloma  The various methods of treatment have been discussed with the patient and family. After consideration of risks, benefits and other options for treatment, the patient has consented to  Procedure(s): LEFT BREAST LUMPECTOMY WITH RADIOACTIVE SEED LOCALIZATION (Left) as a surgical intervention .  The patient's history has been reviewed, patient examined, no change in status, stable for surgery.  I have reviewed the patient's chart and labs.  Questions were answered to the patient's satisfaction.     TOTH III,Xandra Laramee S

## 2017-07-07 NOTE — Op Note (Signed)
07/07/2017  8:28 AM  PATIENT:  Katherine Short  66 y.o. female  PRE-OPERATIVE DIAGNOSIS:  left breast papilloma  POST-OPERATIVE DIAGNOSIS:  left breast papilloma  PROCEDURE:  Procedure(s): LEFT BREAST LUMPECTOMY WITH RADIOACTIVE SEED LOCALIZATION (Left)  SURGEON:  Surgeon(s) and Role:    * Jovita Kussmaul, MD - Primary  PHYSICIAN ASSISTANT:   ASSISTANTS: none   ANESTHESIA:   local and general  EBL:  Total I/O In: -  Out: 5 [Blood:5]  BLOOD ADMINISTERED:none  DRAINS: none   LOCAL MEDICATIONS USED:  MARCAINE     SPECIMEN:  Source of Specimen:  left breast tissue  DISPOSITION OF SPECIMEN:  PATHOLOGY  COUNTS:  YES  TOURNIQUET:  * No tourniquets in log *  DICTATION: .Dragon Dictation   After informed consent was obtained the patient was brought to the operating room and placed in the supine position on the operating table. After adequate induction of general anesthesia the patient's left breast was prepped with ChloraPrep, allowed to dry, and draped in usual sterile manner. An appropriate timeout was performed. Previously an I-125 seed was placed in the upper outer quadrant of the left breast to mark an area of an intraductal papilloma. The neoprobe was set to I-125 in the area of radioactivity was readily identified. The area around this was infiltrated with quarter percent Marcaine with epinephrine. A curvilinear incision was made along the upper outer edge of the areola of the left breast with the 15 blade knife. The incision was carried through the skin and subcutaneous tissue sharply with electrocautery. Dissection was then carried out towards the radioactive seeds under the direction of the neoprobe. Once I approach the radioactive seed then removed a circular portion of breast tissue around the radioactive seed was checked in the area of radioactivity frequently. Once the specimen was removed it was oriented with the appropriate paint colors. A specimen radiograph was  obtained that showed the clip and seed to be within the specimen. The specimen was then sent to pathology for further evaluation. Hemostasis was achieved using the Bovie electrocautery. The area was irrigated with saline and infiltrated with more quarter percent Marcaine. The deep layer of the wound was then closed with layers of interrupted 3-0 Vicryl stitches. The skin was then closed with interrupted 4-0 Monocryl subcuticular stitches. Dermabond dressings were applied. The patient tolerated the procedure well. At the end of the case all needle sponge and instrument counts were correct. The patient was then awakened and taken to recovery in stable condition.  PLAN OF CARE: Discharge to home after PACU  PATIENT DISPOSITION:  PACU - hemodynamically stable.   Delay start of Pharmacological VTE agent (>24hrs) due to surgical blood loss or risk of bleeding: not applicable

## 2017-07-07 NOTE — Anesthesia Procedure Notes (Signed)
Procedure Name: LMA Insertion Date/Time: 07/07/2017 7:44 AM Performed by: Merdis Delay Pre-anesthesia Checklist: Patient identified, Emergency Drugs available, Suction available, Patient being monitored and Timeout performed Patient Re-evaluated:Patient Re-evaluated prior to induction Oxygen Delivery Method: Circle system utilized Preoxygenation: Pre-oxygenation with 100% oxygen Induction Type: IV induction Ventilation: Mask ventilation without difficulty LMA: LMA inserted LMA Size: 4.0 Number of attempts: 1 Placement Confirmation: positive ETCO2 and breath sounds checked- equal and bilateral Tube secured with: Tape Dental Injury: Teeth and Oropharynx as per pre-operative assessment

## 2017-07-07 NOTE — Transfer of Care (Signed)
Immediate Anesthesia Transfer of Care Note  Patient: Katherine Short  Procedure(s) Performed: Procedure(s): LEFT BREAST LUMPECTOMY WITH RADIOACTIVE SEED LOCALIZATION (Left)  Patient Location: PACU  Anesthesia Type:General  Level of Consciousness: drowsy  Airway & Oxygen Therapy: Patient Spontanous Breathing and Patient connected to face mask oxygen  Post-op Assessment: Report given to RN and Post -op Vital signs reviewed and stable  Post vital signs: Reviewed and stable  Last Vitals:  Vitals:   07/07/17 0601  BP: 118/61  Pulse: 71  Resp: 20  Temp: 36.6 C    Last Pain:  Vitals:   07/07/17 0601  TempSrc: Oral      Patients Stated Pain Goal: 1 (23/76/28 3151)  Complications: No apparent anesthesia complications

## 2017-07-08 ENCOUNTER — Encounter (HOSPITAL_COMMUNITY): Payer: Self-pay | Admitting: General Surgery

## 2017-07-13 ENCOUNTER — Other Ambulatory Visit: Payer: Self-pay | Admitting: General Surgery

## 2017-07-13 ENCOUNTER — Encounter (HOSPITAL_BASED_OUTPATIENT_CLINIC_OR_DEPARTMENT_OTHER): Payer: Self-pay | Admitting: *Deleted

## 2017-07-13 DIAGNOSIS — D242 Benign neoplasm of left breast: Secondary | ICD-10-CM

## 2017-07-15 ENCOUNTER — Ambulatory Visit
Admission: RE | Admit: 2017-07-15 | Discharge: 2017-07-15 | Disposition: A | Discharge status: Home / Self Care | Payer: Medicare HMO | Source: Ambulatory Visit | Attending: General Surgery | Admitting: General Surgery

## 2017-07-15 DIAGNOSIS — D242 Benign neoplasm of left breast: Secondary | ICD-10-CM

## 2017-07-15 NOTE — Progress Notes (Signed)
Pt in to pick up boost breeze, instructions reviewed. 

## 2017-07-18 ENCOUNTER — Ambulatory Visit (HOSPITAL_BASED_OUTPATIENT_CLINIC_OR_DEPARTMENT_OTHER): Payer: Medicare HMO | Admitting: Certified Registered"

## 2017-07-18 ENCOUNTER — Encounter (HOSPITAL_BASED_OUTPATIENT_CLINIC_OR_DEPARTMENT_OTHER)
Admission: RE | Disposition: A | Discharge status: Home / Self Care | Payer: Self-pay | Source: Ambulatory Visit | Attending: General Surgery

## 2017-07-18 ENCOUNTER — Ambulatory Visit
Admission: RE | Admit: 2017-07-18 | Discharge: 2017-07-18 | Disposition: A | Discharge status: Home / Self Care | Payer: Medicare HMO | Source: Ambulatory Visit | Attending: General Surgery | Admitting: General Surgery

## 2017-07-18 ENCOUNTER — Encounter (HOSPITAL_BASED_OUTPATIENT_CLINIC_OR_DEPARTMENT_OTHER): Payer: Self-pay

## 2017-07-18 ENCOUNTER — Ambulatory Visit (HOSPITAL_BASED_OUTPATIENT_CLINIC_OR_DEPARTMENT_OTHER)
Admission: RE | Admit: 2017-07-18 | Discharge: 2017-07-18 | Disposition: A | Discharge status: Home / Self Care | Payer: Medicare HMO | Source: Ambulatory Visit | Attending: General Surgery | Admitting: General Surgery

## 2017-07-18 DIAGNOSIS — I10 Essential (primary) hypertension: Secondary | ICD-10-CM | POA: Diagnosis not present

## 2017-07-18 DIAGNOSIS — R51 Headache: Secondary | ICD-10-CM | POA: Diagnosis not present

## 2017-07-18 DIAGNOSIS — D242 Benign neoplasm of left breast: Secondary | ICD-10-CM

## 2017-07-18 DIAGNOSIS — Z880 Allergy status to penicillin: Secondary | ICD-10-CM | POA: Diagnosis not present

## 2017-07-18 DIAGNOSIS — F419 Anxiety disorder, unspecified: Secondary | ICD-10-CM | POA: Diagnosis not present

## 2017-07-18 DIAGNOSIS — F329 Major depressive disorder, single episode, unspecified: Secondary | ICD-10-CM | POA: Diagnosis not present

## 2017-07-18 DIAGNOSIS — K219 Gastro-esophageal reflux disease without esophagitis: Secondary | ICD-10-CM | POA: Insufficient documentation

## 2017-07-18 DIAGNOSIS — E785 Hyperlipidemia, unspecified: Secondary | ICD-10-CM | POA: Insufficient documentation

## 2017-07-18 DIAGNOSIS — Z79899 Other long term (current) drug therapy: Secondary | ICD-10-CM | POA: Diagnosis not present

## 2017-07-18 DIAGNOSIS — R69 Illness, unspecified: Secondary | ICD-10-CM | POA: Diagnosis not present

## 2017-07-18 HISTORY — PX: BREAST LUMPECTOMY WITH RADIOACTIVE SEED LOCALIZATION: SHX6424

## 2017-07-18 HISTORY — DX: Anxiety disorder, unspecified: F41.9

## 2017-07-18 SURGERY — BREAST LUMPECTOMY WITH RADIOACTIVE SEED LOCALIZATION
Anesthesia: General | Site: Breast | Laterality: Left

## 2017-07-18 MED ORDER — GABAPENTIN 300 MG PO CAPS: 300.0000 mg | ORAL_CAPSULE | ORAL | Status: DC

## 2017-07-18 MED ORDER — PROPOFOL 10 MG/ML IV BOLUS
INTRAVENOUS | Status: AC
  Filled 2017-07-18: qty 20

## 2017-07-18 MED ORDER — SCOPOLAMINE 1 MG/3DAYS TD PT72: 1.0000 | MEDICATED_PATCH | Freq: Once | TRANSDERMAL | Status: DC | PRN

## 2017-07-18 MED ORDER — BUPIVACAINE HCL (PF) 0.25 % IJ SOLN
INTRAMUSCULAR | Status: DC | PRN
  Administered 2017-07-18: 20 mL

## 2017-07-18 MED ORDER — DEXAMETHASONE SODIUM PHOSPHATE 10 MG/ML IJ SOLN
INTRAMUSCULAR | Status: AC
  Filled 2017-07-18: qty 1

## 2017-07-18 MED ORDER — ACETAMINOPHEN 500 MG PO TABS
ORAL_TABLET | ORAL | Status: AC
  Filled 2017-07-18: qty 2

## 2017-07-18 MED ORDER — EPHEDRINE 5 MG/ML INJ
INTRAVENOUS | Status: AC
  Filled 2017-07-18: qty 10

## 2017-07-18 MED ORDER — GLYCOPYRROLATE 0.2 MG/ML IV SOSY
PREFILLED_SYRINGE | INTRAVENOUS | Status: DC | PRN
Start: 2017-07-18 — End: 2017-07-18
  Administered 2017-07-18: .2 mg via INTRAVENOUS

## 2017-07-18 MED ORDER — ONDANSETRON HCL 4 MG/2ML IJ SOLN: 4.0000 mg | Freq: Once | INTRAMUSCULAR | Status: DC | PRN

## 2017-07-18 MED ORDER — DEXAMETHASONE SODIUM PHOSPHATE 4 MG/ML IJ SOLN
INTRAMUSCULAR | Status: DC | PRN
  Administered 2017-07-18: 10 mg via INTRAVENOUS

## 2017-07-18 MED ORDER — ONDANSETRON HCL 4 MG/2ML IJ SOLN
INTRAMUSCULAR | Status: AC
  Filled 2017-07-18: qty 2

## 2017-07-18 MED ORDER — EPHEDRINE SULFATE-NACL 50-0.9 MG/10ML-% IV SOSY
PREFILLED_SYRINGE | INTRAVENOUS | Status: DC | PRN
  Administered 2017-07-18: 10 mg via INTRAVENOUS

## 2017-07-18 MED ORDER — ONDANSETRON HCL 4 MG/2ML IJ SOLN
INTRAMUSCULAR | Status: DC | PRN
  Administered 2017-07-18: 4 mg via INTRAVENOUS

## 2017-07-18 MED ORDER — FENTANYL CITRATE (PF) 100 MCG/2ML IJ SOLN
50.0000 ug | INTRAMUSCULAR | Status: DC | PRN
  Administered 2017-07-18: 50 ug via INTRAVENOUS

## 2017-07-18 MED ORDER — MIDAZOLAM HCL 2 MG/2ML IJ SOLN
1.0000 mg | INTRAMUSCULAR | Status: DC | PRN
Start: 2017-07-18 — End: 2017-07-18
  Administered 2017-07-18: 1 mg via INTRAVENOUS

## 2017-07-18 MED ORDER — FENTANYL CITRATE (PF) 100 MCG/2ML IJ SOLN
INTRAMUSCULAR | Status: AC
  Filled 2017-07-18: qty 2

## 2017-07-18 MED ORDER — MIDAZOLAM HCL 2 MG/2ML IJ SOLN
INTRAMUSCULAR | Status: AC
  Filled 2017-07-18: qty 2

## 2017-07-18 MED ORDER — VANCOMYCIN HCL IN DEXTROSE 1-5 GM/200ML-% IV SOLN
INTRAVENOUS | Status: AC
  Filled 2017-07-18: qty 200

## 2017-07-18 MED ORDER — ACETAMINOPHEN 500 MG PO TABS
1000.0000 mg | ORAL_TABLET | ORAL | Status: AC
Start: 2017-07-19 — End: 2017-07-18
  Administered 2017-07-18: 1000 mg via ORAL

## 2017-07-18 MED ORDER — FENTANYL CITRATE (PF) 100 MCG/2ML IJ SOLN: 25.0000 ug | INTRAMUSCULAR | Status: DC | PRN

## 2017-07-18 MED ORDER — PROPOFOL 10 MG/ML IV BOLUS
INTRAVENOUS | Status: DC | PRN
Start: 2017-07-18 — End: 2017-07-18
  Administered 2017-07-18: 150 mg via INTRAVENOUS

## 2017-07-18 MED ORDER — HYDROCODONE-ACETAMINOPHEN 5-325 MG PO TABS: 1.0000 | ORAL_TABLET | ORAL | 0 refills | Status: DC | PRN

## 2017-07-18 MED ORDER — CHLORHEXIDINE GLUCONATE CLOTH 2 % EX PADS: 6.0000 | MEDICATED_PAD | Freq: Once | CUTANEOUS | Status: DC

## 2017-07-18 MED ORDER — CELECOXIB 400 MG PO CAPS
400.0000 mg | ORAL_CAPSULE | ORAL | Status: AC
  Administered 2017-07-18: 400 mg via ORAL

## 2017-07-18 MED ORDER — LIDOCAINE 2% (20 MG/ML) 5 ML SYRINGE
INTRAMUSCULAR | Status: DC | PRN
  Administered 2017-07-18: 100 mg via INTRAVENOUS

## 2017-07-18 MED ORDER — CELECOXIB 200 MG PO CAPS
ORAL_CAPSULE | ORAL | Status: AC
  Filled 2017-07-18: qty 2

## 2017-07-18 MED ORDER — VANCOMYCIN HCL IN DEXTROSE 1-5 GM/200ML-% IV SOLN
1000.0000 mg | INTRAVENOUS | Status: AC
  Administered 2017-07-18: 1000 mg via INTRAVENOUS

## 2017-07-18 MED ORDER — LACTATED RINGERS IV SOLN
INTRAVENOUS | Status: DC
  Administered 2017-07-18 (×2): via INTRAVENOUS

## 2017-07-18 SURGICAL SUPPLY — 46 items
ADH SKN CLS APL DERMABOND .7 (GAUZE/BANDAGES/DRESSINGS) ×1
APPLIER CLIP 9.375 MED OPEN (MISCELLANEOUS)
APR CLP MED 9.3 20 MLT OPN (MISCELLANEOUS)
BLADE SURG 15 STRL LF DISP TIS (BLADE) ×1 IMPLANT
BLADE SURG 15 STRL SS (BLADE) ×3
CANISTER SUC SOCK COL 7IN (MISCELLANEOUS) ×3 IMPLANT
CANISTER SUCT 1200ML W/VALVE (MISCELLANEOUS) ×3 IMPLANT
CHLORAPREP W/TINT 26ML (MISCELLANEOUS) ×3 IMPLANT
CLIP APPLIE 9.375 MED OPEN (MISCELLANEOUS) IMPLANT
COVER BACK TABLE 60X90IN (DRAPES) ×3 IMPLANT
COVER MAYO STAND STRL (DRAPES) ×3 IMPLANT
COVER PROBE W GEL 5X96 (DRAPES) ×3 IMPLANT
DECANTER SPIKE VIAL GLASS SM (MISCELLANEOUS) IMPLANT
DERMABOND ADVANCED (GAUZE/BANDAGES/DRESSINGS) ×2
DERMABOND ADVANCED .7 DNX12 (GAUZE/BANDAGES/DRESSINGS) ×1 IMPLANT
DEVICE DUBIN W/COMP PLATE 8390 (MISCELLANEOUS) ×3 IMPLANT
DRAPE LAPAROSCOPIC ABDOMINAL (DRAPES) ×3 IMPLANT
DRAPE UTILITY XL STRL (DRAPES) ×3 IMPLANT
ELECT COATED BLADE 2.86 ST (ELECTRODE) ×3 IMPLANT
ELECT REM PT RETURN 9FT ADLT (ELECTROSURGICAL) ×3
ELECTRODE REM PT RTRN 9FT ADLT (ELECTROSURGICAL) ×1 IMPLANT
GLOVE BIO SURGEON STRL SZ7.5 (GLOVE) ×6 IMPLANT
GLOVE BIOGEL PI IND STRL 7.0 (GLOVE) IMPLANT
GLOVE BIOGEL PI INDICATOR 7.0 (GLOVE) ×2
GLOVE SURG SS PI 6.5 STRL IVOR (GLOVE) ×2 IMPLANT
GOWN STRL REUS W/ TWL LRG LVL3 (GOWN DISPOSABLE) ×2 IMPLANT
GOWN STRL REUS W/TWL LRG LVL3 (GOWN DISPOSABLE) ×6
ILLUMINATOR WAVEGUIDE N/F (MISCELLANEOUS) IMPLANT
KIT MARKER MARGIN INK (KITS) ×3 IMPLANT
LIGHT WAVEGUIDE WIDE FLAT (MISCELLANEOUS) IMPLANT
NDL HYPO 25X1 1.5 SAFETY (NEEDLE) IMPLANT
NEEDLE HYPO 25X1 1.5 SAFETY (NEEDLE) IMPLANT
NS IRRIG 1000ML POUR BTL (IV SOLUTION) IMPLANT
PACK BASIN DAY SURGERY FS (CUSTOM PROCEDURE TRAY) ×3 IMPLANT
PENCIL BUTTON HOLSTER BLD 10FT (ELECTRODE) ×3 IMPLANT
SLEEVE SCD COMPRESS KNEE MED (MISCELLANEOUS) ×3 IMPLANT
SPONGE LAP 18X18 X RAY DECT (DISPOSABLE) ×3 IMPLANT
SUT MON AB 4-0 PC3 18 (SUTURE) IMPLANT
SUT SILK 2 0 SH (SUTURE) IMPLANT
SUT VICRYL 3-0 CR8 SH (SUTURE) ×3 IMPLANT
SYR CONTROL 10ML LL (SYRINGE) IMPLANT
TOWEL OR 17X24 6PK STRL BLUE (TOWEL DISPOSABLE) ×3 IMPLANT
TOWEL OR NON WOVEN STRL DISP B (DISPOSABLE) ×3 IMPLANT
TUBE CONNECTING 20'X1/4 (TUBING) ×1
TUBE CONNECTING 20X1/4 (TUBING) ×2 IMPLANT
YANKAUER SUCT BULB TIP NO VENT (SUCTIONS) IMPLANT

## 2017-07-18 NOTE — H&P (Signed)
Katherine Short is an 66 y.o. female.   Chief Complaint: papilloma HPI: The patient was recently diagnosed with a papilloma of the left breast. She underwent radioactive seed guided lumpectomy that the pathologist felt that day he had localized and in correct place. She returns today for radioactive seed guided  lumpectomy of the appropriate area  Past Medical History:  Diagnosis Date  . Anxiety   . Chest pain 03/19/2010   nuclear study was negative  . Depression   . Dyslipidemia   . GERD (gastroesophageal reflux disease)   . Headache(784.0)   . Hypertension     Past Surgical History:  Procedure Laterality Date  . BREAST LUMPECTOMY WITH RADIOACTIVE SEED LOCALIZATION Left 07/07/2017   Procedure: LEFT BREAST LUMPECTOMY WITH RADIOACTIVE SEED LOCALIZATION;  Surgeon: Jovita Kussmaul, MD;  Location: [place];  Service: General;  Laterality: Left;  . CATARACT EXTRACTION W/PHACO  11/22/2011   Procedure: CATARACT EXTRACTION PHACO AND INTRAOCULAR LENS PLACEMENT (IOC);  Surgeon: Tonny Branch;  Location: [facility];  Service: Ophthalmology;  Laterality: Left;  CDE:7.76  . CATARACT EXTRACTION W/PHACO  12/02/2011   Procedure: CATARACT EXTRACTION PHACO AND INTRAOCULAR LENS PLACEMENT (IOC);  Surgeon: Tonny Branch;  Location: [facility];  Service: Ophthalmology;  Laterality: Right;  CDE=11.92  . COLONOSCOPY    . TUBAL LIGATION      Family History  Problem Relation Age of Onset  . Cancer Mother 49       breast  . Diabetes Father    Social History:  reports that she has never smoked. She has never used smokeless tobacco. She reports that she does not drink alcohol or use drugs.  Allergies:  Allergies  Allergen Reactions  . Penicillins Itching and Rash         Medications Prior to Admission  Medication Sig Dispense Refill  . acyclovir (ZOVIRAX) 200 MG capsule Take 200 mg by mouth 2 (two) times daily.     Marland Kitchen buPROPion (WELLBUTRIN SR) 150 MG 12 hr tablet Take 150 mg by mouth 2 (two) times daily.  1  .  CONTRAVE 8-90 MG TB12 Take 2 tablets by mouth 2 (two) times daily.  0  . Estradiol-Norethindrone Acet (ACTIVELLA) 0.5-0.1 MG per tablet Take 1 tablet by mouth at bedtime.     . gabapentin (NEURONTIN) 300 MG capsule Take 300 mg by mouth 2 (two) times daily.  1  . HYDROcodone-acetaminophen (NORCO/VICODIN) 5-325 MG tablet Take 1-2 tablets by mouth every 4 (four) hours as needed for moderate pain or severe pain. 10 tablet 0  . meclizine (ANTIVERT) 25 MG tablet Take 1 tablet (25 mg total) by mouth 4 (four) times daily as needed for dizziness. 40 tablet 0  . olmesartan (BENICAR) 40 MG tablet Take 1 tablet (40 mg total) by mouth daily. 90 tablet 3  . simvastatin (ZOCOR) 40 MG tablet Take 1 tablet (40 mg total) by mouth daily. (Patient taking differently: Take 40 mg by mouth at bedtime. ) 90 tablet 3  . temazepam (RESTORIL) 30 MG capsule Take 30 mg by mouth at bedtime.      Marland Kitchen VIIBRYD 40 MG TABS Take 40 mg by mouth at bedtime.  0  . ibuprofen (ADVIL,MOTRIN) 200 MG tablet Take 600 mg by mouth every 8 (eight) hours as needed (for headache/pain.).       No results found for this or any previous visit (from the past 48 hour(s)). No results found.  Review of Systems  Constitutional: Negative.   HENT: Negative.  Eyes: Negative.   Respiratory: Negative.   Cardiovascular: Negative.   Gastrointestinal: Negative.   Genitourinary: Negative.   Musculoskeletal: Negative.   Skin: Negative.   Neurological: Negative.   Endo/Heme/Allergies: Negative.   Psychiatric/Behavioral: Negative.     Blood pressure 128/73, pulse 70, temperature 98.1 F (36.7 C), temperature source Oral, resp. rate 18, height 5\' 4"  (1.626 m), weight 76.7 kg (169 lb), SpO2 97 %. Physical Exam  Constitutional: She is oriented to person, place, and time. She appears well-developed and well-nourished. No distress.  HENT:  Head: Normocephalic and atraumatic.  Mouth/Throat: No oropharyngeal exudate.  Eyes: Pupils are equal, round, and  reactive to light. Conjunctivae and EOM are normal.  Neck: Normal range of motion. Neck supple.  Cardiovascular: Normal rate, regular rhythm and normal heart sounds.   Respiratory: Effort normal and breath sounds normal. No stridor. No respiratory distress.  GI: Soft. Bowel sounds are normal. There is no tenderness.  Musculoskeletal: Normal range of motion. She exhibits no edema or tenderness.  Neurological: She is alert and oriented to person, place, and time. Coordination normal.  Skin: Skin is warm and dry. No erythema.  Psychiatric: She has a normal mood and affect. Her behavior is normal. Thought content normal.     Assessment/Plan The patient has a left breast papilloma. She has elected for left breast radioactive seed guided lumpectomy of the area. I have discussed with her in detail the risks and benefits of the operation as well as some of the technical aspects and she understands and wishes to proceed.  Merrie Roof, MD 07/18/2017, 1:51 PM

## 2017-07-18 NOTE — Transfer of Care (Signed)
Immediate Anesthesia Transfer of Care Note  Patient: Katherine Short  Procedure(s) Performed: Procedure(s): LEFT BREAST LUMPECTOMY WITH RADIOACTIVE SEED LOCALIZATION (Left)  Patient Location: PACU  Anesthesia Type:General  Level of Consciousness: awake, sedated and patient cooperative  Airway & Oxygen Therapy: Patient Spontanous Breathing and Patient connected to face mask oxygen  Post-op Assessment: Report given to RN and Post -op Vital signs reviewed and stable  Post vital signs: Reviewed and stable  Last Vitals:  Vitals:   07/18/17 1221  BP: 128/73  Pulse: 70  Resp: 18  Temp: 36.7 C    Last Pain:  Vitals:   07/18/17 1221  TempSrc: Oral         Complications: No apparent anesthesia complications

## 2017-07-18 NOTE — Op Note (Signed)
07/18/2017  2:34 PM  PATIENT:  Katherine Short  66 y.o. female  PRE-OPERATIVE DIAGNOSIS:  LEFT BREAST PAPILLOMA  POST-OPERATIVE DIAGNOSIS:  LEFT BREAST PAPILLOMA  PROCEDURE:  Procedure(s): LEFT BREAST LUMPECTOMY WITH RADIOACTIVE SEED LOCALIZATION (Left)  SURGEON:  Surgeon(s) and Role:    * Jovita Kussmaul, MD - Primary  PHYSICIAN ASSISTANT:   ASSISTANTS: none   ANESTHESIA:   local and general  EBL:  Total I/O In: 150 [I.V.:150] Out: -   BLOOD ADMINISTERED:none  DRAINS: none   LOCAL MEDICATIONS USED:  MARCAINE     SPECIMEN:  Source of Specimen:  left breast tissue with additional lateral margin  DISPOSITION OF SPECIMEN:  PATHOLOGY  COUNTS:  YES  TOURNIQUET:  * No tourniquets in log *  DICTATION: .Dragon Dictation   After informed consent was obtained the patient was brought to the operating room and placed in the supine position on the operating room table. After adequate induction of general anesthesia the patient's left breast was prepped with ChloraPrep, allowed to dry, and draped in usual sterile manner. An appropriate timeout was performed. Previously an I-125 seed was placed in the subareolar in her left breast to mark an area of an intraductal papilloma. The neoprobe was set to I-125 in the area of radioactivity was readily identified. I did not choose to go back to her old incision because I would have to traverse the area behind the nipple in order to reach the seed. Instead I made a small curvilinear incision along the inner aspect of the left areola overlying the area of radioactivity with a 15 blade knife. The incision was carried through the skin and subcutaneous tissue sharply with electrocautery. A circular portion of breast tissue was then excised sharply around the radioactive seed while checking the area of radioactivity frequently with the neoprobe. Once the specimen was removed it was oriented with the appropriate paint colors. A specimen radiograph was  obtained that showed the clip and seed to be within the specimen. The specimen was then sent to pathology for further evaluation. There was a firm nodule along the lateral edge of the lumpectomy cavity that was excised sharply with the electrocautery and marked with the appropriate paint color. This was sent to pathology as the new lateral margin. Hemostasis was achieved using the Bovie electrocautery. The wound was irrigated with saline and infiltrated with more quarter percent Marcaine. The deep layer of the wound was then closed with layers of interrupted 3-0 Vicryl stitches. The skin was then closed with interrupted 4-0 Monocryl subcuticular stitches. Dermabond dressings were applied. The patient tolerated the procedure well. At the end of the case all needle sponge and instrument counts were correct. The patient was then awakened and taken to recovery in stable condition.  PLAN OF CARE: Discharge to home after PACU  PATIENT DISPOSITION:  PACU - hemodynamically stable.   Delay start of Pharmacological VTE agent (>24hrs) due to surgical blood loss or risk of bleeding: not applicable

## 2017-07-18 NOTE — Anesthesia Preprocedure Evaluation (Signed)
Anesthesia Evaluation  Patient identified by MRN, date of birth, ID band Patient awake    Reviewed: Allergy & Precautions, NPO status , Patient's Chart, lab work & pertinent test results  Airway Mallampati: II  TM Distance: >3 FB Neck ROM: Full    Dental  (+) Teeth Intact, Dental Advisory Given   Pulmonary neg pulmonary ROS,    Pulmonary exam normal breath sounds clear to auscultation       Cardiovascular hypertension, Pt. on medications Normal cardiovascular exam Rhythm:Regular Rate:Normal     Neuro/Psych  Headaches, PSYCHIATRIC DISORDERS Anxiety Depression    GI/Hepatic Neg liver ROS, GERD  ,  Endo/Other  negative endocrine ROS  Renal/GU negative Renal ROS     Musculoskeletal negative musculoskeletal ROS (+)   Abdominal   Peds  Hematology negative hematology ROS (+)   Anesthesia Other Findings Day of surgery medications reviewed with the patient.  Reproductive/Obstetrics                             Anesthesia Physical Anesthesia Plan  ASA: II  Anesthesia Plan: General   Post-op Pain Management:    Induction: Intravenous  PONV Risk Score and Plan: 3 and Ondansetron, Dexamethasone and Midazolam  Airway Management Planned: LMA  Additional Equipment:   Intra-op Plan:   Post-operative Plan: Extubation in OR  Informed Consent: I have reviewed the patients History and Physical, chart, labs and discussed the procedure including the risks, benefits and alternatives for the proposed anesthesia with the patient or authorized representative who has indicated his/her understanding and acceptance.   Dental advisory given  Plan Discussed with: CRNA  Anesthesia Plan Comments: (Risks/benefits of general anesthesia discussed with patient including risk of damage to teeth, lips, gum, and tongue, nausea/vomiting, allergic reactions to medications, and the possibility of heart attack, stroke  and death.  All patient questions answered.  Patient wishes to proceed.)        Anesthesia Quick Evaluation

## 2017-07-18 NOTE — Anesthesia Procedure Notes (Signed)
Procedure Name: LMA Insertion Date/Time: 07/18/2017 2:05 PM Performed by: Lyndee Leo Pre-anesthesia Checklist: Patient identified, Emergency Drugs available, Suction available and Patient being monitored Patient Re-evaluated:Patient Re-evaluated prior to induction Oxygen Delivery Method: Circle system utilized Preoxygenation: Pre-oxygenation with 100% oxygen Induction Type: IV induction Ventilation: Mask ventilation without difficulty LMA: LMA inserted LMA Size: 4.0 Number of attempts: 1 Airway Equipment and Method: Bite block Placement Confirmation: positive ETCO2 Tube secured with: Tape Dental Injury: Teeth and Oropharynx as per pre-operative assessment

## 2017-07-18 NOTE — Interval H&P Note (Signed)
History and Physical Interval Note:  07/18/2017 1:54 PM  Katherine Short  has presented today for surgery, with the diagnosis of LEFT BREAST PAPILLOMA  The various methods of treatment have been discussed with the patient and family. After consideration of risks, benefits and other options for treatment, the patient has consented to  Procedure(s): LEFT BREAST LUMPECTOMY WITH RADIOACTIVE SEED LOCALIZATION (Left) as a surgical intervention .  The patient's history has been reviewed, patient examined, no change in status, stable for surgery.  I have reviewed the patient's chart and labs.  Questions were answered to the patient's satisfaction.     TOTH III,PAUL S

## 2017-07-19 ENCOUNTER — Encounter (HOSPITAL_BASED_OUTPATIENT_CLINIC_OR_DEPARTMENT_OTHER): Payer: Self-pay | Admitting: General Surgery

## 2017-07-19 NOTE — Anesthesia Postprocedure Evaluation (Signed)
Anesthesia Post Note  Patient: Katherine Short  Procedure(s) Performed: Procedure(s) (LRB): LEFT BREAST LUMPECTOMY WITH RADIOACTIVE SEED LOCALIZATION (Left)     Patient location during evaluation: PACU Anesthesia Type: General Level of consciousness: awake and alert Pain management: pain level controlled Vital Signs Assessment: post-procedure vital signs reviewed and stable Respiratory status: spontaneous breathing, nonlabored ventilation, respiratory function stable and patient connected to nasal cannula oxygen Cardiovascular status: blood pressure returned to baseline and stable Postop Assessment: no signs of nausea or vomiting Anesthetic complications: no    Last Vitals:  Vitals:   07/18/17 1515 07/18/17 1544  BP: 128/78 132/86  Pulse: 73 73  Resp: 10 14  Temp:  36.7 C    Last Pain:  Vitals:   07/18/17 1544  TempSrc: Oral  PainSc: 0-No pain                 Catalina Gravel

## 2017-07-27 DIAGNOSIS — Z6829 Body mass index (BMI) 29.0-29.9, adult: Secondary | ICD-10-CM | POA: Diagnosis not present

## 2017-07-27 DIAGNOSIS — M62830 Muscle spasm of back: Secondary | ICD-10-CM | POA: Diagnosis not present

## 2017-07-29 DIAGNOSIS — M9903 Segmental and somatic dysfunction of lumbar region: Secondary | ICD-10-CM | POA: Diagnosis not present

## 2017-07-29 DIAGNOSIS — M5441 Lumbago with sciatica, right side: Secondary | ICD-10-CM | POA: Diagnosis not present

## 2017-07-29 DIAGNOSIS — M9902 Segmental and somatic dysfunction of thoracic region: Secondary | ICD-10-CM | POA: Diagnosis not present

## 2017-07-29 DIAGNOSIS — M9905 Segmental and somatic dysfunction of pelvic region: Secondary | ICD-10-CM | POA: Diagnosis not present

## 2017-08-01 DIAGNOSIS — M9903 Segmental and somatic dysfunction of lumbar region: Secondary | ICD-10-CM | POA: Diagnosis not present

## 2017-08-01 DIAGNOSIS — M9905 Segmental and somatic dysfunction of pelvic region: Secondary | ICD-10-CM | POA: Diagnosis not present

## 2017-08-01 DIAGNOSIS — M9902 Segmental and somatic dysfunction of thoracic region: Secondary | ICD-10-CM | POA: Diagnosis not present

## 2017-08-01 DIAGNOSIS — M5441 Lumbago with sciatica, right side: Secondary | ICD-10-CM | POA: Diagnosis not present

## 2017-08-05 DIAGNOSIS — M9903 Segmental and somatic dysfunction of lumbar region: Secondary | ICD-10-CM | POA: Diagnosis not present

## 2017-08-05 DIAGNOSIS — M9902 Segmental and somatic dysfunction of thoracic region: Secondary | ICD-10-CM | POA: Diagnosis not present

## 2017-08-05 DIAGNOSIS — M9905 Segmental and somatic dysfunction of pelvic region: Secondary | ICD-10-CM | POA: Diagnosis not present

## 2017-08-05 DIAGNOSIS — M5441 Lumbago with sciatica, right side: Secondary | ICD-10-CM | POA: Diagnosis not present

## 2017-08-11 DIAGNOSIS — Z6829 Body mass index (BMI) 29.0-29.9, adult: Secondary | ICD-10-CM | POA: Diagnosis not present

## 2017-08-11 DIAGNOSIS — M62838 Other muscle spasm: Secondary | ICD-10-CM | POA: Diagnosis not present

## 2017-08-11 DIAGNOSIS — H8149 Vertigo of central origin, unspecified ear: Secondary | ICD-10-CM | POA: Diagnosis not present

## 2017-08-12 DIAGNOSIS — M9905 Segmental and somatic dysfunction of pelvic region: Secondary | ICD-10-CM | POA: Diagnosis not present

## 2017-08-12 DIAGNOSIS — M9902 Segmental and somatic dysfunction of thoracic region: Secondary | ICD-10-CM | POA: Diagnosis not present

## 2017-08-12 DIAGNOSIS — M9903 Segmental and somatic dysfunction of lumbar region: Secondary | ICD-10-CM | POA: Diagnosis not present

## 2017-08-12 DIAGNOSIS — M5441 Lumbago with sciatica, right side: Secondary | ICD-10-CM | POA: Diagnosis not present

## 2017-08-24 DIAGNOSIS — M9902 Segmental and somatic dysfunction of thoracic region: Secondary | ICD-10-CM | POA: Diagnosis not present

## 2017-08-24 DIAGNOSIS — M9905 Segmental and somatic dysfunction of pelvic region: Secondary | ICD-10-CM | POA: Diagnosis not present

## 2017-08-24 DIAGNOSIS — M9903 Segmental and somatic dysfunction of lumbar region: Secondary | ICD-10-CM | POA: Diagnosis not present

## 2017-08-24 DIAGNOSIS — M5441 Lumbago with sciatica, right side: Secondary | ICD-10-CM | POA: Diagnosis not present

## 2017-09-04 DIAGNOSIS — R69 Illness, unspecified: Secondary | ICD-10-CM | POA: Diagnosis not present

## 2017-11-14 DIAGNOSIS — M9902 Segmental and somatic dysfunction of thoracic region: Secondary | ICD-10-CM | POA: Diagnosis not present

## 2017-11-14 DIAGNOSIS — M9905 Segmental and somatic dysfunction of pelvic region: Secondary | ICD-10-CM | POA: Diagnosis not present

## 2017-11-14 DIAGNOSIS — M5441 Lumbago with sciatica, right side: Secondary | ICD-10-CM | POA: Diagnosis not present

## 2017-11-14 DIAGNOSIS — M9903 Segmental and somatic dysfunction of lumbar region: Secondary | ICD-10-CM | POA: Diagnosis not present

## 2017-11-16 DIAGNOSIS — M5441 Lumbago with sciatica, right side: Secondary | ICD-10-CM | POA: Diagnosis not present

## 2017-11-16 DIAGNOSIS — M5431 Sciatica, right side: Secondary | ICD-10-CM | POA: Diagnosis not present

## 2017-11-16 DIAGNOSIS — M9905 Segmental and somatic dysfunction of pelvic region: Secondary | ICD-10-CM | POA: Diagnosis not present

## 2017-11-16 DIAGNOSIS — M9902 Segmental and somatic dysfunction of thoracic region: Secondary | ICD-10-CM | POA: Diagnosis not present

## 2017-11-16 DIAGNOSIS — M9903 Segmental and somatic dysfunction of lumbar region: Secondary | ICD-10-CM | POA: Diagnosis not present

## 2018-01-25 DIAGNOSIS — Z1283 Encounter for screening for malignant neoplasm of skin: Secondary | ICD-10-CM | POA: Diagnosis not present

## 2018-01-25 DIAGNOSIS — D225 Melanocytic nevi of trunk: Secondary | ICD-10-CM | POA: Diagnosis not present

## 2018-01-25 DIAGNOSIS — L258 Unspecified contact dermatitis due to other agents: Secondary | ICD-10-CM | POA: Diagnosis not present

## 2018-02-13 DIAGNOSIS — M216X9 Other acquired deformities of unspecified foot: Secondary | ICD-10-CM | POA: Diagnosis not present

## 2018-02-13 DIAGNOSIS — M79675 Pain in left toe(s): Secondary | ICD-10-CM | POA: Diagnosis not present

## 2018-02-13 DIAGNOSIS — L603 Nail dystrophy: Secondary | ICD-10-CM | POA: Diagnosis not present

## 2018-02-27 DIAGNOSIS — L603 Nail dystrophy: Secondary | ICD-10-CM | POA: Diagnosis not present

## 2018-02-27 DIAGNOSIS — M79675 Pain in left toe(s): Secondary | ICD-10-CM | POA: Diagnosis not present

## 2018-03-01 DIAGNOSIS — M5431 Sciatica, right side: Secondary | ICD-10-CM | POA: Diagnosis not present

## 2018-03-01 DIAGNOSIS — J019 Acute sinusitis, unspecified: Secondary | ICD-10-CM | POA: Diagnosis not present

## 2018-04-05 DIAGNOSIS — Z6829 Body mass index (BMI) 29.0-29.9, adult: Secondary | ICD-10-CM | POA: Diagnosis not present

## 2018-04-05 DIAGNOSIS — Z01419 Encounter for gynecological examination (general) (routine) without abnormal findings: Secondary | ICD-10-CM | POA: Diagnosis not present

## 2018-04-05 DIAGNOSIS — Z1231 Encounter for screening mammogram for malignant neoplasm of breast: Secondary | ICD-10-CM | POA: Diagnosis not present

## 2018-04-05 DIAGNOSIS — N958 Other specified menopausal and perimenopausal disorders: Secondary | ICD-10-CM | POA: Diagnosis not present

## 2018-04-25 DIAGNOSIS — Z1211 Encounter for screening for malignant neoplasm of colon: Secondary | ICD-10-CM | POA: Diagnosis not present

## 2018-04-25 DIAGNOSIS — K219 Gastro-esophageal reflux disease without esophagitis: Secondary | ICD-10-CM | POA: Diagnosis not present

## 2018-04-25 DIAGNOSIS — R1310 Dysphagia, unspecified: Secondary | ICD-10-CM | POA: Diagnosis not present

## 2018-04-25 DIAGNOSIS — Z86010 Personal history of colonic polyps: Secondary | ICD-10-CM | POA: Diagnosis not present

## 2018-05-05 DIAGNOSIS — Z1211 Encounter for screening for malignant neoplasm of colon: Secondary | ICD-10-CM | POA: Diagnosis not present

## 2018-05-05 DIAGNOSIS — Z86010 Personal history of colonic polyps: Secondary | ICD-10-CM | POA: Diagnosis not present

## 2018-05-05 DIAGNOSIS — D125 Benign neoplasm of sigmoid colon: Secondary | ICD-10-CM | POA: Diagnosis not present

## 2018-05-05 DIAGNOSIS — K635 Polyp of colon: Secondary | ICD-10-CM | POA: Diagnosis not present

## 2018-05-05 DIAGNOSIS — K219 Gastro-esophageal reflux disease without esophagitis: Secondary | ICD-10-CM | POA: Diagnosis not present

## 2018-05-05 DIAGNOSIS — R1310 Dysphagia, unspecified: Secondary | ICD-10-CM | POA: Diagnosis not present

## 2018-09-05 DIAGNOSIS — E785 Hyperlipidemia, unspecified: Secondary | ICD-10-CM | POA: Diagnosis not present

## 2018-09-05 DIAGNOSIS — I10 Essential (primary) hypertension: Secondary | ICD-10-CM | POA: Diagnosis not present

## 2018-09-12 DIAGNOSIS — I10 Essential (primary) hypertension: Secondary | ICD-10-CM | POA: Diagnosis not present

## 2018-11-06 DIAGNOSIS — R69 Illness, unspecified: Secondary | ICD-10-CM | POA: Diagnosis not present

## 2018-12-17 IMAGING — MG BREAST SURGICAL SPECIMEN
1 series · 1 of 1 positions shown · non-contrast
Comparison: Previous exam(s).

CLINICAL DATA: Status post left breast surgery

EXAM:
SPECIMEN RADIOGRAPH OF THE LEFT BREAST

[L]
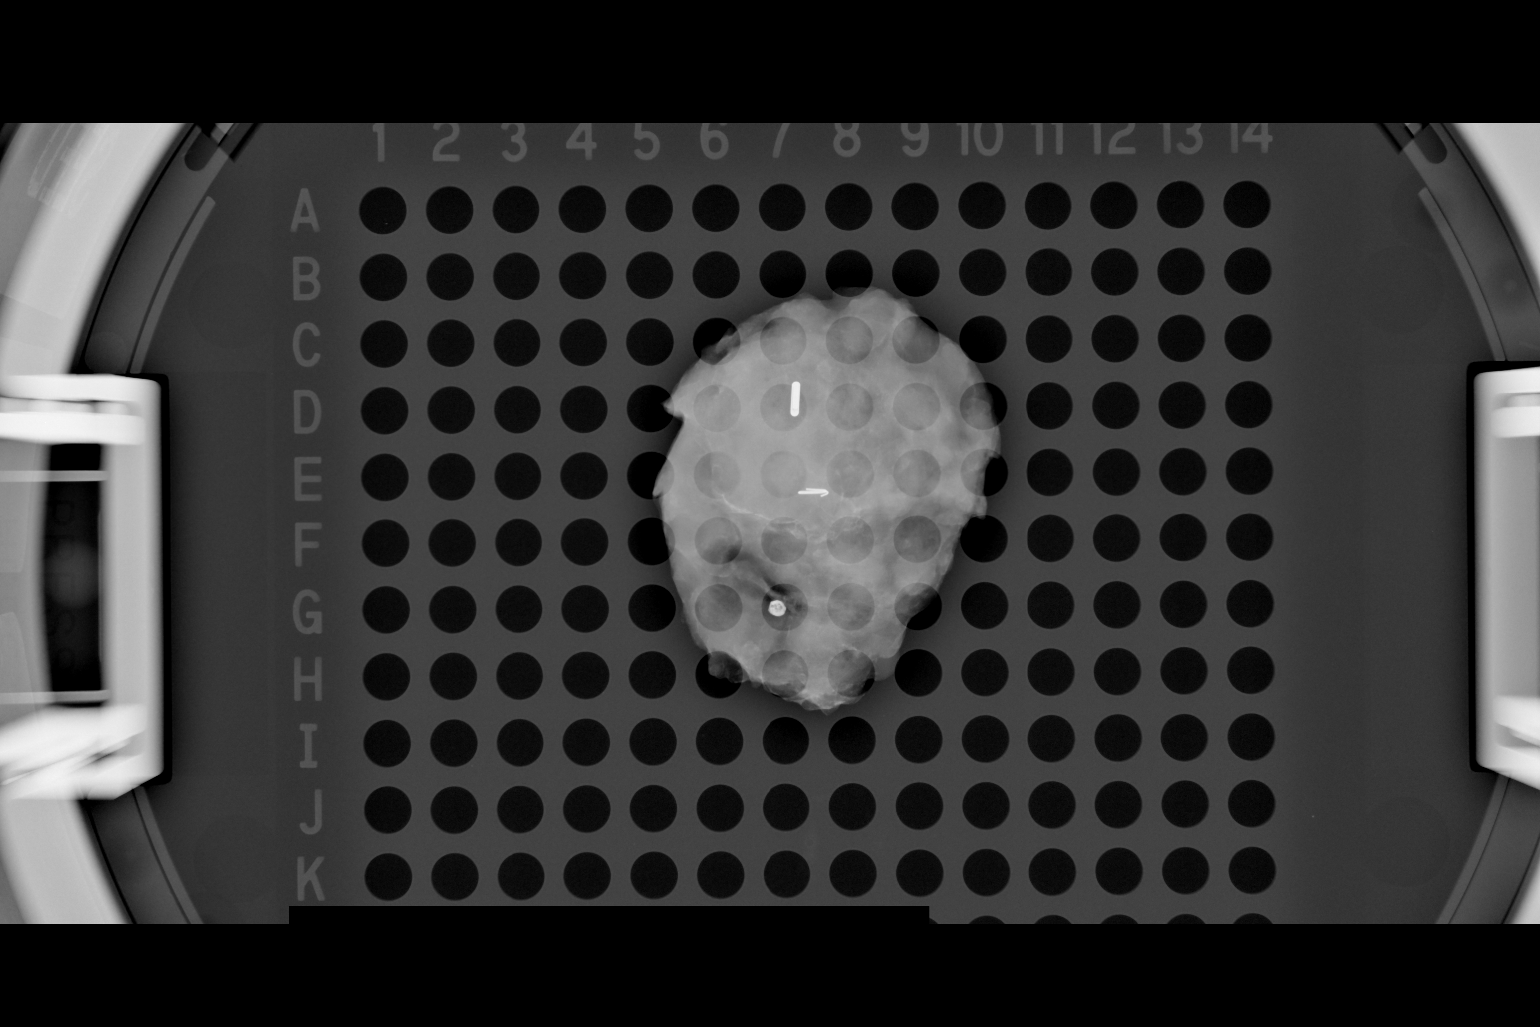

[1 of 1 positions shown; findings below may reference images not displayed]

FINDINGS: Status post excision of the left breast. The radioactive seed and
heart shaped biopsy marker clip are present, completely intact, and
were marked for pathology.
IMPRESSION: Specimen radiograph of the left breast.

## 2018-12-25 IMAGING — MG NEEDLE LOCALIZATION OF THE LEFT BREAST WITH MAMMO GUIDANCE
5 series · 5 of 5 positions shown · non-contrast
Comparison: Previous exam(s).

CLINICAL DATA: Recently diagnosed intraductal papilloma in the 9:30
o'clock position of the left breast.

EXAM:
MAMMOGRAPHIC GUIDED RADIOACTIVE SEED LOCALIZATION OF THE LEFT BREAST

[L CC (1 of 3)]
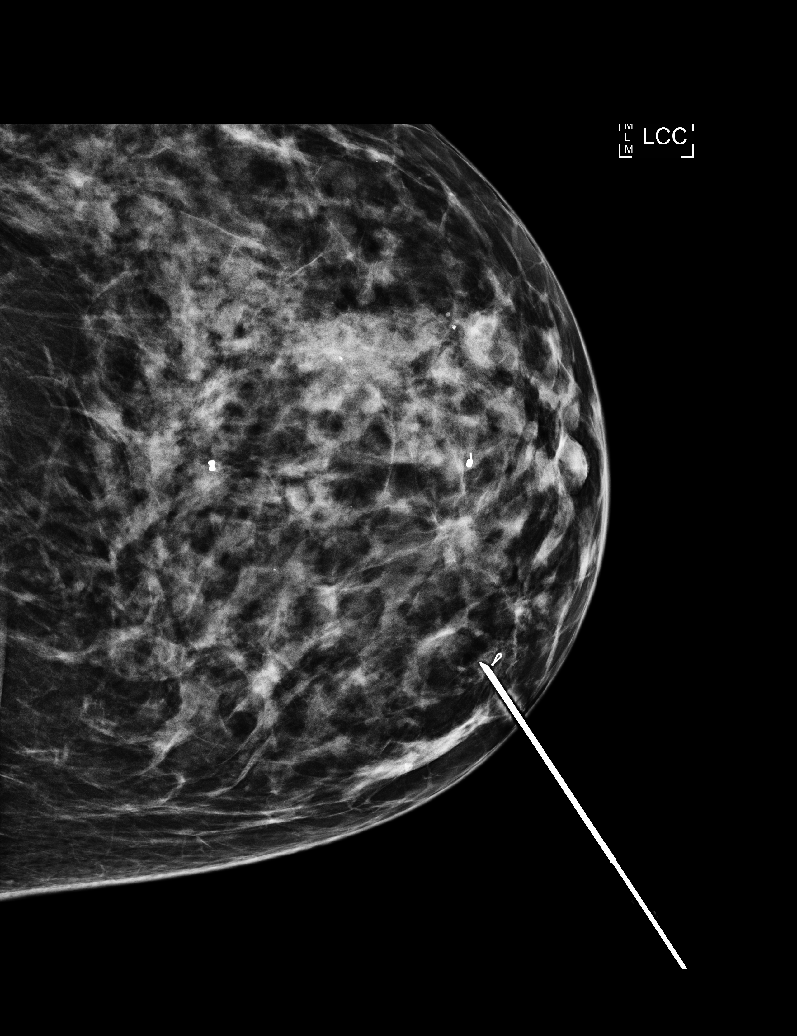

[L CC (2 of 3)]
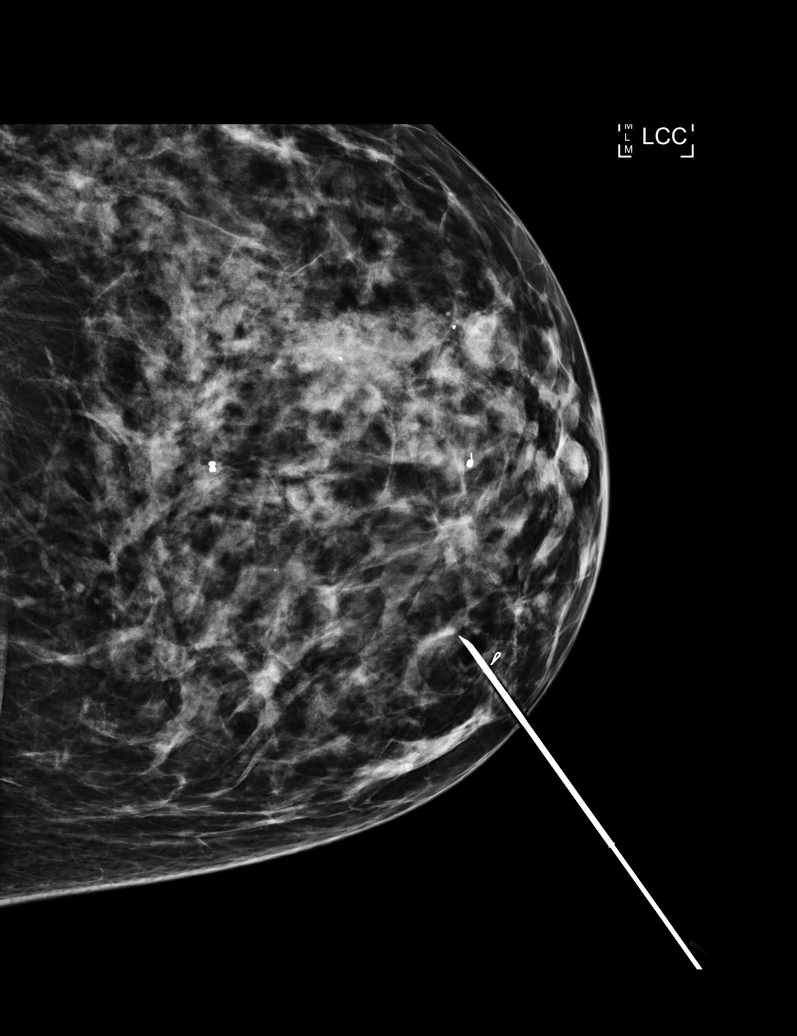

[L CC (3 of 3)]
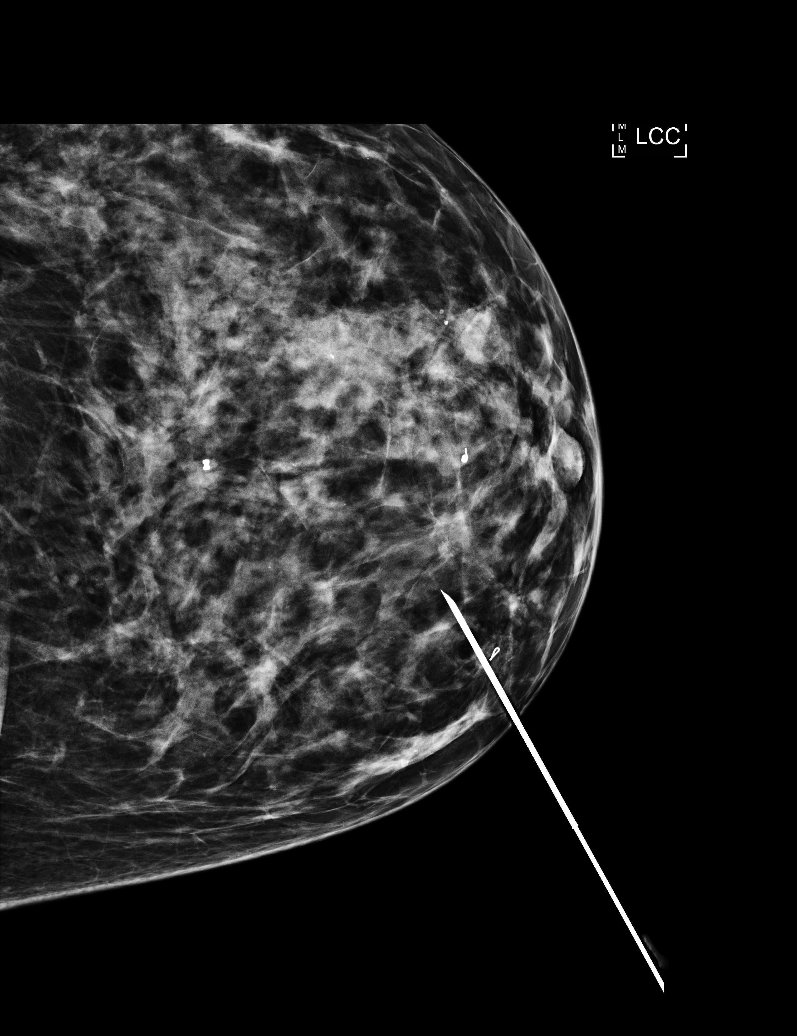

[L ML (1 of 2)]
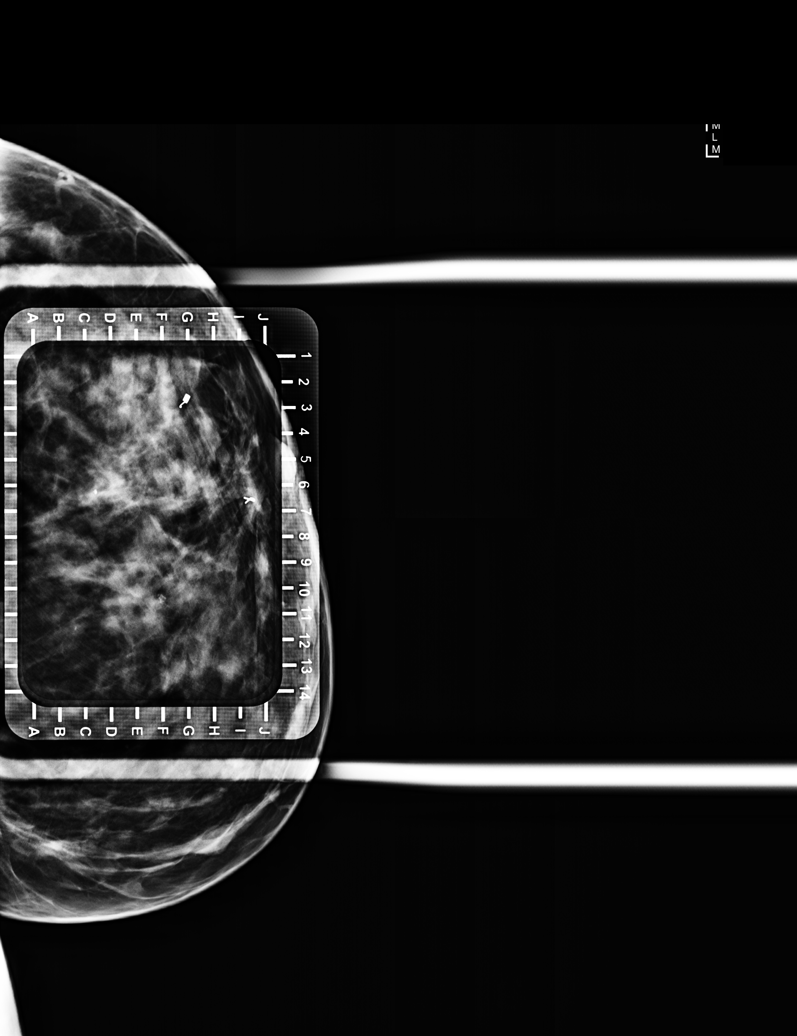

[L ML (2 of 2)]
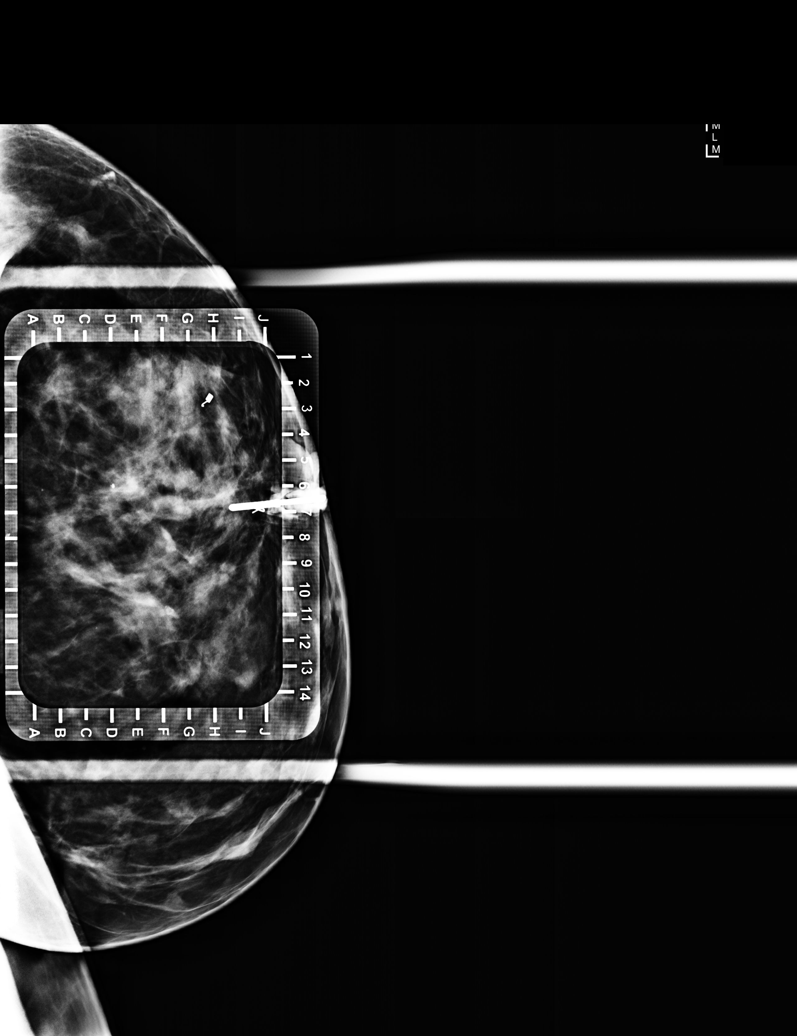

[5 of 5 positions shown; findings below may reference images not displayed]

FINDINGS: Patient presents for radioactive seed localization prior to left
breast surgical excision. I met with the patient and we discussed
the procedure of seed localization including benefits and
alternatives. We discussed the high likelihood of a successful
procedure. We discussed the risks of the procedure including
infection, bleeding, tissue injury and further surgery. We discussed
the low dose of radioactivity involved in the procedure. Informed,
written consent was given.

The usual time-out protocol was performed immediately prior to the
procedure.

Using mammographic guidance, sterile technique, 1% lidocaine and an
R-QPK radioactive seed, the recently placed ribbon shaped biopsy
marker clip in the medial left breast was localized using a medial
approach. The follow-up mammogram images confirm the seed in the
expected location and were marked for Dr. Prerna.

Follow-up survey of the patient confirms presence of the radioactive
seed.

Order number of R-QPK seed:  [id number].

Total activity:  0.252 millicurie  Reference Date: 07/05/2017

The patient tolerated the procedure well and was released from the
[REDACTED]. She was given instructions regarding seed removal.
IMPRESSION: Radioactive seed localization left breast. No apparent
complications.

## 2019-03-01 DIAGNOSIS — H5210 Myopia, unspecified eye: Secondary | ICD-10-CM | POA: Diagnosis not present

## 2019-03-01 DIAGNOSIS — Z9849 Cataract extraction status, unspecified eye: Secondary | ICD-10-CM | POA: Diagnosis not present

## 2019-03-01 DIAGNOSIS — Z961 Presence of intraocular lens: Secondary | ICD-10-CM | POA: Diagnosis not present

## 2019-03-01 DIAGNOSIS — I10 Essential (primary) hypertension: Secondary | ICD-10-CM | POA: Diagnosis not present

## 2019-03-01 DIAGNOSIS — H04123 Dry eye syndrome of bilateral lacrimal glands: Secondary | ICD-10-CM | POA: Diagnosis not present

## 2019-04-11 DIAGNOSIS — Z0000 Encounter for general adult medical examination without abnormal findings: Secondary | ICD-10-CM | POA: Diagnosis not present

## 2019-04-18 DIAGNOSIS — B009 Herpesviral infection, unspecified: Secondary | ICD-10-CM | POA: Diagnosis not present

## 2019-04-18 DIAGNOSIS — Z1231 Encounter for screening mammogram for malignant neoplasm of breast: Secondary | ICD-10-CM | POA: Diagnosis not present

## 2019-04-18 DIAGNOSIS — Z6825 Body mass index (BMI) 25.0-25.9, adult: Secondary | ICD-10-CM | POA: Diagnosis not present

## 2019-04-18 DIAGNOSIS — Z124 Encounter for screening for malignant neoplasm of cervix: Secondary | ICD-10-CM | POA: Diagnosis not present

## 2019-04-18 DIAGNOSIS — R609 Edema, unspecified: Secondary | ICD-10-CM | POA: Diagnosis not present

## 2019-08-13 DIAGNOSIS — R21 Rash and other nonspecific skin eruption: Secondary | ICD-10-CM | POA: Diagnosis not present

## 2019-09-10 DIAGNOSIS — R69 Illness, unspecified: Secondary | ICD-10-CM | POA: Diagnosis not present

## 2019-09-21 DIAGNOSIS — R21 Rash and other nonspecific skin eruption: Secondary | ICD-10-CM | POA: Diagnosis not present

## 2020-01-07 DIAGNOSIS — R140 Abdominal distension (gaseous): Secondary | ICD-10-CM | POA: Diagnosis not present

## 2020-01-07 DIAGNOSIS — R102 Pelvic and perineal pain: Secondary | ICD-10-CM | POA: Diagnosis not present

## 2020-01-07 DIAGNOSIS — N950 Postmenopausal bleeding: Secondary | ICD-10-CM | POA: Diagnosis not present

## 2020-01-15 DIAGNOSIS — N950 Postmenopausal bleeding: Secondary | ICD-10-CM | POA: Diagnosis not present

## 2021-06-17 ENCOUNTER — Encounter: Payer: Medicare HMO | Attending: Obstetrics and Gynecology | Admitting: Nutrition

## 2021-06-17 VITALS — Ht 64.0 in | Wt 181.0 lb

## 2021-06-17 DIAGNOSIS — E785 Hyperlipidemia, unspecified: Secondary | ICD-10-CM

## 2021-06-17 DIAGNOSIS — R739 Hyperglycemia, unspecified: Secondary | ICD-10-CM | POA: Insufficient documentation

## 2021-06-17 DIAGNOSIS — I10 Essential (primary) hypertension: Secondary | ICD-10-CM

## 2021-06-17 DIAGNOSIS — E669 Obesity, unspecified: Secondary | ICD-10-CM

## 2021-06-17 NOTE — Patient Instructions (Addendum)
Goals  Eat three meals per day Eat meals on time Cut out snacks and sweets/desserts Increase fresh fruits and vegetables Keep walking Lose 1/2-1 lb per week Work on meal planning for healthy food choices.

## 2021-06-17 NOTE — Progress Notes (Signed)
.  Medical Nutrition Therapy  Appointment Start time:  8338  Appointment End time:  58  Primary concerns today: Pre diabetes, Obesity  Referral diagnosis: E66.9, R73.9 Preferred learning style: no preference  Learning readiness: change in progress)   NUTRITION ASSESSMENT  Dr. Mittie Bodo OBG[facility] in [place]. Bone Density good Has gabepentin and Wellbutrin. Diet is inconsistent to meet her needs. FBS slightly elevated. Motivated to work on weight loss and improve eating habits.  Anthropometrics  Wt Readings from Last 3 Encounters:  06/17/21 181 lb (82.1 kg)  07/18/17 169 lb (76.7 kg)  07/04/17 171 lb 4.8 oz (77.7 kg)   Ht Readings from Last 3 Encounters:  06/17/21 5\' 4"  (1.626 m)  07/18/17 5\' 4"  (1.626 m)  07/04/17 5\' 4"  (1.626 m)   Body mass index is 31.07 kg/m. @BMIFA @ Facility age limit for growth percentiles is 20 years. Facility age limit for growth percentiles is 20 years.    Clinical Medical Hx: Pre DM and Obesity Medications: Phenteramin 37.5 mg a day;  Labs: 6.2%, Notable Signs/Symptoms: none  Lifestyle & Dietary Hx Lives with husband   Estimated daily fluid intake: 64 oz Supplements: none Sleep: 6/7 Stress / self-care: Husband and children, Current average weekly physical activity: walks [place] [address].  24-Hr Dietary Recall First Meal: Toaster strudules and bowl of grits, water  Snack: candy or misc snack Second Meal: skips , water Snack: misc  Third Meal: Squash, cheese, baked in oven Snack:  Beverages: water0  Estimated Energy Needs Calories: 1200  Carbohydrate: 135g Protein: 90g Fat: 33g   NUTRITION DIAGNOSIS  NI-1.5 Excessive energy intake As related to HIgh calorie, high processed diet.  As evidenced by BMI > 30, Prediabetes with A1C of. 6.2%.   NUTRITION INTERVENTION  Nutrition education (E-1) on the following topics:  Nutrition and Pre Diabetes education provided on My Plate, CHO counting, meal planning, portion sizes, timing of  meals, avoiding snacks between meals unless having a low blood sugar, target ranges for A1C and blood sugars, signs/symptoms and treatment of hyper/hypoglycemia, monitoring blood sugars, taking medications as prescribed, benefits of exercising 30 minutes per day and prevention of complications of DM. Healthy weight loss tips Nutrition density of foods  Handouts Provided Include  My Plate  Meal Planning Nutrition and healthy lifestyle  Learning Style & Readiness for Change Teaching method utilized: Visual & Auditory  Demonstrated degree of understanding via: Teach Back  Barriers to learning/adherence to lifestyle change: none  Goals Established by Pt Goals  Eat three meals per day Eat meals on time Cut out snacks and sweets/desserts Increase fresh fruits and vegetables Keep walking Lose 1/2-1 lb per week Work on meal planning for healthy food choices.   MONITORING & EVALUATION Dietary intake, weekly physical activity, and weight  in 1 month.  Next Steps  Patient is to work on meal planning.Marland Kitchen

## 2021-06-24 ENCOUNTER — Ambulatory Visit: Payer: Medicare HMO | Admitting: Nutrition

## 2021-07-09 ENCOUNTER — Encounter: Payer: Self-pay | Admitting: Nutrition

## 2021-07-13 ENCOUNTER — Ambulatory Visit (HOSPITAL_COMMUNITY)
Admission: RE | Admit: 2021-07-13 | Discharge: 2021-07-13 | Disposition: A | Discharge status: Home / Self Care | Payer: Medicare HMO | Source: Ambulatory Visit | Attending: Student | Admitting: Student

## 2021-07-13 ENCOUNTER — Other Ambulatory Visit: Payer: Self-pay

## 2021-07-13 ENCOUNTER — Other Ambulatory Visit (HOSPITAL_COMMUNITY): Payer: Self-pay | Admitting: Student

## 2021-07-13 DIAGNOSIS — M5441 Lumbago with sciatica, right side: Secondary | ICD-10-CM | POA: Insufficient documentation

## 2021-07-22 ENCOUNTER — Ambulatory Visit: Payer: Medicare HMO | Admitting: Nutrition

## 2021-07-23 ENCOUNTER — Encounter: Payer: Medicare HMO | Attending: Internal Medicine | Admitting: Nutrition

## 2021-07-23 ENCOUNTER — Other Ambulatory Visit: Payer: Self-pay

## 2021-07-23 ENCOUNTER — Encounter: Payer: Self-pay | Admitting: Nutrition

## 2021-07-23 VITALS — Ht 64.0 in | Wt 179.0 lb

## 2021-07-23 DIAGNOSIS — E785 Hyperlipidemia, unspecified: Secondary | ICD-10-CM | POA: Insufficient documentation

## 2021-07-23 DIAGNOSIS — I10 Essential (primary) hypertension: Secondary | ICD-10-CM | POA: Insufficient documentation

## 2021-07-23 DIAGNOSIS — R739 Hyperglycemia, unspecified: Secondary | ICD-10-CM

## 2021-07-23 NOTE — Progress Notes (Signed)
.  Medical Nutrition Therapy 0830 end 0930  Primary concerns today: Pre diabetes, Obesity  Referral diagnosis: E66.9, R73.9 Preferred learning style: no preference  Learning readiness: change in progress)   NUTRITION ASSESSMENT  Is going to a chiropractor for her back issues. Has osteoarthritis. Dr. Lovena Le in [place]. Dr. Mittie Bodo OBG[facility] in [place]. Sees a provider in Dr. Olivia Canter office for her PCP. Had lab work done recently. A1C was 6.2%. Bone Density good Has gabepentin and Wellbutrin. Not taking Metformin.   Changes made: has been working on eating better foods. Cut down on sugar and salt. Hasn't been walking due to MD appts.  Anthropometrics  Wt Readings from Last 3 Encounters:  06/17/21 181 lb (82.1 kg)  07/18/17 169 lb (76.7 kg)  07/04/17 171 lb 4.8 oz (77.7 kg)   Ht Readings from Last 3 Encounters:  06/17/21 '5\' 4"'$  (1.626 m)  07/18/17 '5\' 4"'$  (1.626 m)  07/04/17 '5\' 4"'$  (1.626 m)   There is no height or weight on file to calculate BMI. '@BMIFA'$ @ Facility age limit for growth percentiles is 20 years. Facility age limit for growth percentiles is 20 years.    Clinical Medical Hx: Pre DM and Obesity Medications: Phenteramin 37.5 mg a day; Still taking it. Labs: 6.2%, Notable Signs/Symptoms: none  Lifestyle & Dietary Hx Lives with husband   Estimated daily fluid intake: 64 oz Supplements: none Sleep: 6/7 Stress / self-care: Husband and children, Current average weekly physical activity: walks [place] [address].  24-Hr Dietary Recall First Meal: Fiber one with blackberries, water  Snack:  Second Meal:  Snack: misc  Third Meal: Tomato sandwich, water Snack:  Beverages: water0  Estimated Energy Needs Calories: 1200  Carbohydrate: 135g Protein: 90g Fat: 33g   NUTRITION DIAGNOSIS  NI-1.5 Excessive energy intake As related to HIgh calorie, high processed diet.  As evidenced by BMI > 30, Prediabetes with A1C of. 6.2%.   NUTRITION INTERVENTION   Nutrition education (E-1) on the following topics:  Nutrition and Pre Diabetes education provided on My Plate, CHO counting, meal planning, portion sizes, timing of meals, avoiding snacks between meals unless having a low blood sugar, target ranges for A1C and blood sugars, signs/symptoms and treatment of hyper/hypoglycemia, monitoring blood sugars, taking medications as prescribed, benefits of exercising 30 minutes per day and prevention of complications of DM. Healthy weight loss tips Nutrition density of foods  Handouts Provided Include  My Plate  Meal Planning Nutrition and healthy lifestyle  Learning Style & Readiness for Change Teaching method utilized: Visual & Auditory  Demonstrated degree of understanding via: Teach Back  Barriers to learning/adherence to lifestyle change: none  Goals Established by Pt Goals  Eat three meals per day Eat meals on time Cut out snacks and sweets/desserts Increase fresh fruits and vegetables Keep walking Lose 1/2-1 lb per week Work on meal planning for healthy food choices.   MONITORING & EVALUATION Dietary intake, weekly physical activity, and weight  in 1 month.  Next Steps  Patient is to work on meal planning.Marland Kitchen

## 2021-07-23 NOTE — Patient Instructions (Addendum)
Goals  Make sure to eat lunch dinner Try drinking True Lemon instead of sprite zero Try using neck towels to stay coo Get in walking to do 150 minutes a week Get A1C less 6%

## 2021-07-27 ENCOUNTER — Encounter: Payer: Self-pay | Admitting: Nutrition

## 2021-10-14 ENCOUNTER — Ambulatory Visit (HOSPITAL_COMMUNITY)
Admission: RE | Admit: 2021-10-14 | Discharge: 2021-10-14 | Disposition: A | Discharge status: Home / Self Care | Payer: Medicare HMO | Source: Ambulatory Visit | Attending: Family Medicine | Admitting: Family Medicine

## 2021-10-14 ENCOUNTER — Other Ambulatory Visit: Payer: Self-pay

## 2021-10-14 ENCOUNTER — Other Ambulatory Visit (HOSPITAL_COMMUNITY): Payer: Self-pay | Admitting: Family Medicine

## 2021-10-14 DIAGNOSIS — M79602 Pain in left arm: Secondary | ICD-10-CM | POA: Diagnosis not present

## 2021-11-09 ENCOUNTER — Other Ambulatory Visit: Payer: Self-pay

## 2021-11-10 ENCOUNTER — Other Ambulatory Visit: Payer: Self-pay

## 2021-11-10 ENCOUNTER — Ambulatory Visit (INDEPENDENT_AMBULATORY_CARE_PROVIDER_SITE_OTHER): Payer: Medicare HMO

## 2021-11-10 ENCOUNTER — Ambulatory Visit
Admission: EM | Admit: 2021-11-10 | Discharge: 2021-11-10 | Disposition: A | Discharge status: Home / Self Care | Payer: TRICARE For Life (TFL) | Attending: Family Medicine | Admitting: Family Medicine

## 2021-11-10 VITALS — BP 153/93 | HR 84 | Temp 98.4°F | Resp 16

## 2021-11-10 DIAGNOSIS — M25532 Pain in left wrist: Secondary | ICD-10-CM | POA: Diagnosis not present

## 2021-11-10 DIAGNOSIS — W19XXXA Unspecified fall, initial encounter: Secondary | ICD-10-CM | POA: Diagnosis not present

## 2021-11-10 DIAGNOSIS — M79602 Pain in left arm: Secondary | ICD-10-CM | POA: Diagnosis not present

## 2021-11-10 DIAGNOSIS — S0512XA Contusion of eyeball and orbital tissues, left eye, initial encounter: Secondary | ICD-10-CM

## 2021-11-10 MED ORDER — CYCLOBENZAPRINE HCL 5 MG PO TABS: 5.0000 mg | ORAL_TABLET | Freq: Three times a day (TID) | ORAL | 0 refills | Status: DC | PRN

## 2021-11-10 NOTE — ED Triage Notes (Signed)
Patient states she fell Sunday while out camping and walking the dog and she tripped on a root. She does not remember falling and was alone while husband was at camp sight. She had blood on her hand when she came to and he left arm is very sore if she puts any pressure on it. She has been having headache since fell. She would like her left eye checked as well.

## 2021-11-14 NOTE — ED Provider Notes (Signed)
[facility]    CSN: [id number] Arrival date & time: 11/10/21  1056      History   Chief Complaint No chief complaint on file.   HPI Katherine Short is a 70 y.o. female.   Presenting today for evaluation of left eye bruising, pain and left wrist and arm pain following a mechanical fall while camping this over the weekend. She states she tripped on a root and fell, catching herself with her left hand and hitting the left side of her face near her eye on the ground. She denies LOC, significant headaches, dizziness, N/V, gait changes, vision changes, decreased ROM in left arm. So far not trying anything OTC for sxs. Has already seen the dentist for this issue as she also hit the front of her mouth and hurt a few teeth.    Past Medical History:  Diagnosis Date   Anxiety    Chest pain 03/19/2010   nuclear study was negative   Depression    Dyslipidemia    GERD (gastroesophageal reflux disease)    Headache(784.0)    Hypertension     Patient Active Problem List   Diagnosis Date Noted   Dyslipidemia 05/25/2013   Atypical chest pain 05/25/2013   HTN (hypertension) 05/25/2013    Past Surgical History:  Procedure Laterality Date   BREAST LUMPECTOMY WITH RADIOACTIVE SEED LOCALIZATION Left 07/07/2017   Procedure: LEFT BREAST LUMPECTOMY WITH RADIOACTIVE SEED LOCALIZATION;  Surgeon: Jovita Kussmaul, MD;  Location: [place];  Service: General;  Laterality: Left;   BREAST LUMPECTOMY WITH RADIOACTIVE SEED LOCALIZATION Left 07/18/2017   Procedure: LEFT BREAST LUMPECTOMY WITH RADIOACTIVE SEED LOCALIZATION;  Surgeon: Jovita Kussmaul, MD;  Location: [facility];  Service: General;  Laterality: Left;   CATARACT EXTRACTION W/PHACO  11/22/2011   Procedure: CATARACT EXTRACTION PHACO AND INTRAOCULAR LENS PLACEMENT ([place]);  Surgeon: Tonny Branch;  Location: [facility];  Service: Ophthalmology;  Laterality: Left;  CDE:7.76   CATARACT EXTRACTION W/PHACO  12/02/2011   Procedure: CATARACT  EXTRACTION PHACO AND INTRAOCULAR LENS PLACEMENT ([facility]);  Surgeon: Tonny Branch;  Location: [facility];  Service: Ophthalmology;  Laterality: Right;  CDE=11.92   COLONOSCOPY     TUBAL LIGATION      OB History   No obstetric history on file.      Home Medications    Prior to Admission medications   Medication Sig Start Date End Date Taking? Authorizing Provider  cyclobenzaprine (FLEXERIL) 5 MG tablet Take 1 tablet (5 mg total) by mouth 3 (three) times daily as needed for muscle spasms. Do not drink alcohol or drive while taking this medication.  May cause drowsiness. 11/10/21  Yes Volney American, PA-C  acyclovir (ZOVIRAX) 200 MG capsule Take 200 mg by mouth 2 (two) times daily.     [provider]  buPROPion (WELLBUTRIN SR) 150 MG 12 hr tablet Take 150 mg by mouth 2 (two) times daily. 06/05/17   [provider]  CONTRAVE 8-90 MG TB12 Take 2 tablets by mouth 2 (two) times daily. 06/22/17   [provider]  Estradiol-Norethindrone Acet (ACTIVELLA) 0.5-0.1 MG per tablet Take 1 tablet by mouth at bedtime.     [provider]  gabapentin (NEURONTIN) 300 MG capsule Take 300 mg by mouth 2 (two) times daily. 06/05/17   [provider]  HYDROcodone-acetaminophen (NORCO/VICODIN) 5-325 MG tablet Take 1-2 tablets by mouth every 4 (four) hours as needed for moderate pain or severe pain. 07/07/17   Autumn Messing III,  MD  HYDROcodone-acetaminophen (NORCO/VICODIN) 5-325 MG tablet Take 1-2 tablets by mouth every 4 (four) hours as needed for moderate pain or severe pain. 07/18/17   Autumn Messing III, MD  ibuprofen (ADVIL,MOTRIN) 200 MG tablet Take 600 mg by mouth every 8 (eight) hours as needed (for headache/pain.).     [provider]  meclizine (ANTIVERT) 25 MG tablet Take 1 tablet (25 mg total) by mouth 4 (four) times daily as needed for dizziness. 08/31/14   Rolland Porter, MD  olmesartan (BENICAR) 40 MG tablet Take 1 tablet (40 mg total) by mouth daily. 08/14/14    Hilty, Nadean Corwin, MD  simvastatin (ZOCOR) 40 MG tablet Take 1 tablet (40 mg total) by mouth daily. Patient taking differently: Take 40 mg by mouth at bedtime.  05/21/15   Hilty, Nadean Corwin, MD  temazepam (RESTORIL) 30 MG capsule Take 30 mg by mouth at bedtime.      [provider]  VIIBRYD 40 MG TABS Take 40 mg by mouth at bedtime. 05/06/17   [provider]    Family History Family History  Problem Relation Age of Onset   Cancer Mother 1       breast   Diabetes Father     Social History Social History   Tobacco Use   Smoking status: Never   Smokeless tobacco: Never  Vaping Use   Vaping Use: Never used  Substance Use Topics   Alcohol use: No   Drug use: No     Allergies   Penicillins   Review of Systems Review of Systems PER HPI   Physical Exam Triage Vital Signs ED Triage Vitals  Enc Vitals Group     BP 11/10/21 1131 (!) 153/93     Pulse Rate 11/10/21 1131 84     Resp 11/10/21 1131 16     Temp 11/10/21 1131 98.4 F (36.9 C)     Temp Source 11/10/21 1131 Oral     SpO2 11/10/21 1131 93 %     Weight --      Height --      Head Circumference --      Peak Flow --      Pain Score 11/10/21 1127 5     Pain Loc --      Pain Edu? --      Excl. in [place]? --    No data found.  Updated Vital Signs BP (!) 153/93 (BP Location: Right Arm)   Pulse 84   Temp 98.4 F (36.9 C) (Oral)   Resp 16   SpO2 93%   Visual Acuity Right Eye Distance:   Left Eye Distance:   Bilateral Distance:    Right Eye Near:   Left Eye Near:    Bilateral Near:     Physical Exam Vitals and nursing note reviewed.  Constitutional:      Appearance: Normal appearance. She is not ill-appearing.  HENT:     Head: Atraumatic.     Mouth/Throat:     Mouth: Mucous membranes are moist.  Eyes:     Extraocular Movements: Extraocular movements intact.     Conjunctiva/sclera: Conjunctivae normal.     Pupils: Pupils are equal, round, and reactive to light.  Cardiovascular:      Rate and Rhythm: Normal rate and regular rhythm.     Heart sounds: Normal heart sounds.  Pulmonary:     Effort: Pulmonary effort is normal.     Breath sounds: Normal breath sounds.  Musculoskeletal:  General: Tenderness and signs of injury present. No swelling. Normal range of motion.     Cervical back: Normal range of motion and neck supple.     Comments: Left arm diffusely ttp, worst at wrist. No deformities palpable in either region of pain, ROM full and intact, strength full and equal b/l UEs.   Skin:    General: Skin is warm and dry.     Findings: Bruising (moderate contusion periorbital area left eye with ttp diffusely) present.  Neurological:     General: No focal deficit present.     Mental Status: She is alert and oriented to person, place, and time.     Motor: No weakness.     Gait: Gait normal.     Comments: All 4 extremities neurovascularly intact  Psychiatric:        Mood and Affect: Mood normal.        Thought Content: Thought content normal.        Judgment: Judgment normal.     UC Treatments / Results  Labs (all labs ordered are listed, but only abnormal results are displayed) Labs Reviewed - No data to display  EKG   Radiology No results found.  Procedures Procedures (including critical care time)  Medications Ordered in UC Medications - No data to display  Initial Impression / Assessment and Plan / UC Course  I have reviewed the triage vital signs and the nursing notes.  Pertinent labs & imaging results that were available during my care of the patient were reviewed by me and considered in my medical decision making (see chart for details).     Exam and vitals reassuring, left wrist x-ray neg for bony injury and contusion to left eye region appears fairly superficial with no obvious evidence of bony trauma or globe involvement. Discussed flexeril, continued OTC pain relievers, wrist brace, RICE protocol. Return for acutely worsening sxs.    Final Clinical Impressions(s) / UC Diagnoses   Final diagnoses:  Left wrist pain  Contusion of left eye, initial encounter  Left arm pain  Fall, initial encounter   Discharge Instructions   None    ED Prescriptions     Medication Sig Dispense Auth. Provider   cyclobenzaprine (FLEXERIL) 5 MG tablet Take 1 tablet (5 mg total) by mouth 3 (three) times daily as needed for muscle spasms. Do not drink alcohol or drive while taking this medication.  May cause drowsiness. 15 tablet Volney American, [place]      PDMP not reviewed this encounter.   Volney American, [place] 11/14/21 [phone]

## 2022-01-14 ENCOUNTER — Encounter: Payer: TRICARE For Life (TFL) | Attending: Internal Medicine | Admitting: Nutrition

## 2022-01-14 ENCOUNTER — Encounter: Payer: Self-pay | Admitting: Nutrition

## 2022-01-14 ENCOUNTER — Other Ambulatory Visit: Payer: Self-pay

## 2022-01-14 VITALS — Wt 178.8 lb

## 2022-01-14 DIAGNOSIS — E6609 Other obesity due to excess calories: Secondary | ICD-10-CM | POA: Insufficient documentation

## 2022-01-14 DIAGNOSIS — R7303 Prediabetes: Secondary | ICD-10-CM | POA: Insufficient documentation

## 2022-01-14 DIAGNOSIS — I10 Essential (primary) hypertension: Secondary | ICD-10-CM | POA: Insufficient documentation

## 2022-01-14 DIAGNOSIS — Z6830 Body mass index (BMI) 30.0-30.9, adult: Secondary | ICD-10-CM | POA: Insufficient documentation

## 2022-01-14 DIAGNOSIS — E785 Hyperlipidemia, unspecified: Secondary | ICD-10-CM | POA: Insufficient documentation

## 2022-01-14 NOTE — Patient Instructions (Addendum)
Try over night oats Look into silver sneakers at the [facility]. Increase high fiber foods. Get in 150 mins a week. Lose 2 lbs per month. Get A1C down to 5.7% or below. Don't skip meals.

## 2022-01-14 NOTE — Progress Notes (Signed)
.Medical Nutrition Therapy [phone]  Primary concerns today: Pre diabetes, Obesity, hyperlipidemia Referral diagnosis: E66.9, R73.9, E73.03 Preferred learning style: no preference  Learning readiness: change in progress)   NUTRITION ASSESSMENT  She fell during a camping trip recently. Didn't remember it. [place] again and trip over some wire at home. Scheduled to see her PCP in the next week. Having problems remembering information. Didn't like the Healthy Choice Power Bowls. Has been eating fiber 1 cereal in the am. Started on Rebelysus for weight loss. Tolerating so far. Last A1C I have was 6.2% for her. Not taking Metformin. Not testing blood sugars due to no reported diagnosis of Type 2 DM, just prediabetes. Lost  1 lb since her last visit with me. Splurged over the holidays.  Wants to get back to the [facility] and do water aerobics and exercise for desired weight loss and improve lipids.   Changes made: has been working on eating better foods. Cut down on sugar and salt. Hasn't been walking due to MD appts.  Anthropometrics  Wt Readings from Last 3 Encounters:  07/23/21 179 lb (81.2 kg)  06/17/21 181 lb (82.1 kg)  07/18/17 169 lb (76.7 kg)   Ht Readings from Last 3 Encounters:  07/23/21 5\' 4"  (1.626 m)  06/17/21 5\' 4"  (1.626 m)  07/18/17 5\' 4"  (1.626 m)   There is no height or weight on file to calculate BMI. @BMIFA @ Facility age limit for growth percentiles is 20 years. Facility age limit for growth percentiles is 20 years.  Lipid Panel     Component Value Date/Time   CHOL 180 08/14/2014 0911   TRIG 106 08/14/2014 0911   HDL 52 08/14/2014 0911   CHOLHDL 3.5 08/14/2014 0911   VLDL 21 08/14/2014 0911   LDLCALC 107 (H) 08/14/2014 0911     Clinical Medical Hx: Pre DM and Obesity Medications: Labs: 6.2%, Notable Signs/Symptoms: none  Lifestyle & Dietary Hx Lives with husband   Estimated daily fluid intake: 64 oz Supplements: none Sleep: 6/7 Stress /  self-care: Husband and children, Current average weekly physical activity: walks [place] [address].  24-Hr Dietary Recall First Meal: Fiber one with blackberries, water  Snack:  Second Meal: skipped Snack: misc  Third Meal: Tomato sandwich, water Snack:  Beverages: water  Estimated Energy Needs Calories: 1200  Carbohydrate: 135g Protein: 90g Fat: 33g   NUTRITION DIAGNOSIS  NI-1.5 Excessive energy intake As related to HIgh calorie, high processed diet.  As evidenced by BMI > 30, Prediabetes with A1C of. 6.2%.   NUTRITION INTERVENTION  Nutrition education (E-1) on the following topics:  Lifestyle Medicine - Whole Food, Plant Predominant Nutrition is highly recommended: Eat Plenty of vegetables, Mushrooms, fruits, Legumes, Whole Grains, Nuts, seeds in lieu of processed meats, processed snacks/pastries red meat, poultry, eggs.    -It is better to avoid simple carbohydrates including: Cakes, Sweet Desserts, Ice Cream, Soda (diet and regular), Sweet Tea, Candies, Chips, Cookies, Store Bought Juices, Alcohol in Excess of  1-2 drinks a day, Lemonade,  Artificial Sweeteners, Doughnuts, Coffee Creamers, "Sugar-free" Products, etc, etc.  This is not a complete list.....  Exercise: If you are able: 30 -60 minutes a day ,4 days a week, or 150 minutes a week.  The longer the better.  Combine stretch, strength, and aerobic activities.  If you were told in the past that you have high risk for cardiovascular diseases, you may seek evaluation by your heart doctor prior to initiating moderate to intense exercise programs.   Handouts  Provided Include  Heart Healthy guidelines  Learning Style & Readiness for Change Teaching method utilized: Visual & Auditory  Demonstrated degree of understanding via: Teach Back  Barriers to learning/adherence to lifestyle change: none  Goals Established by Pt Goals Try over night oats Look into silver sneakers at the [facility] Increase high fiber foods. Get in 150  mins a week. Lose 2 lbs per month. Get A1C down to 5.7% or below. Don't skip meals.    MONITORING & EVALUATION Dietary intake, weekly physical activity, and weight  in 3-4 month.  Next Steps  Patient is to work on meal planning and increased exercise.

## 2022-06-14 ENCOUNTER — Ambulatory Visit: Payer: TRICARE For Life (TFL) | Admitting: Nutrition

## 2022-07-04 ENCOUNTER — Other Ambulatory Visit: Payer: Self-pay

## 2022-07-04 ENCOUNTER — Encounter (HOSPITAL_COMMUNITY): Payer: Self-pay | Admitting: *Deleted

## 2022-07-04 ENCOUNTER — Emergency Department (HOSPITAL_COMMUNITY)
Admission: EM | Admit: 2022-07-04 | Discharge: 2022-07-04 | Disposition: A | Discharge status: Home / Self Care | Payer: Medicare HMO | Attending: Emergency Medicine | Admitting: Emergency Medicine

## 2022-07-04 DIAGNOSIS — X58XXXA Exposure to other specified factors, initial encounter: Secondary | ICD-10-CM | POA: Insufficient documentation

## 2022-07-04 DIAGNOSIS — Z23 Encounter for immunization: Secondary | ICD-10-CM | POA: Insufficient documentation

## 2022-07-04 DIAGNOSIS — S0990XA Unspecified injury of head, initial encounter: Secondary | ICD-10-CM

## 2022-07-04 DIAGNOSIS — S0005XA Superficial foreign body of scalp, initial encounter: Secondary | ICD-10-CM | POA: Diagnosis not present

## 2022-07-04 MED ORDER — TETANUS-DIPHTH-ACELL PERTUSSIS 5-2.5-18.5 LF-MCG/0.5 IM SUSY
0.5000 mL | PREFILLED_SYRINGE | Freq: Once | INTRAMUSCULAR | Status: AC
  Administered 2022-07-04: 0.5 mL via INTRAMUSCULAR
  Filled 2022-07-04: qty 0.5

## 2022-07-04 MED ORDER — LIDOCAINE HCL (PF) 1 % IJ SOLN
10.0000 mL | Freq: Once | INTRAMUSCULAR | Status: AC
Start: 2022-07-04 — End: 2022-07-04
  Administered 2022-07-04: 10 mL
  Filled 2022-07-04: qty 10

## 2022-07-04 NOTE — Discharge Instructions (Signed)
Please keep area clean and dry.  Can wash as normal.  Return to the emergency department for any worsening symptoms.

## 2022-07-04 NOTE — ED Provider Notes (Signed)
[facility] Provider Note   CSN: [id number] Arrival date & time: 07/04/22  1853     History Chief Complaint  Patient presents with   Foreign Body in [place] is a 71 y.o. female patient who presents to the emergency [facility] today for further evaluation of a fishhook stuck in the occipital head.  Patient states he was reaching into her husband's truck when she got stuck with a hook.  Denies any other injury.  Tetanus not up-to-date.  HPI     Home Medications Prior to Admission medications   Medication Sig Start Date End Date Taking? Authorizing Provider  acyclovir (ZOVIRAX) 200 MG capsule Take 200 mg by mouth 2 (two) times daily.     [provider]  buPROPion (WELLBUTRIN SR) 150 MG 12 hr tablet Take 150 mg by mouth 2 (two) times daily. 06/05/17   [provider]  CONTRAVE 8-90 MG TB12 Take 2 tablets by mouth 2 (two) times daily. 06/22/17   [provider]  cyclobenzaprine (FLEXERIL) 5 MG tablet Take 1 tablet (5 mg total) by mouth 3 (three) times daily as needed for muscle spasms. Do not drink alcohol or drive while taking this medication.  May cause drowsiness. 11/10/21   Volney American, PA-C  Estradiol-Norethindrone Acet (ACTIVELLA) 0.5-0.1 MG per tablet Take 1 tablet by mouth at bedtime.     [provider]  gabapentin (NEURONTIN) 300 MG capsule Take 300 mg by mouth 2 (two) times daily. 06/05/17   [provider]  HYDROcodone-acetaminophen (NORCO/VICODIN) 5-325 MG tablet Take 1-2 tablets by mouth every 4 (four) hours as needed for moderate pain or severe pain. 07/07/17   Jovita Kussmaul, MD  HYDROcodone-acetaminophen (NORCO/VICODIN) 5-325 MG tablet Take 1-2 tablets by mouth every 4 (four) hours as needed for moderate pain or severe pain. 07/18/17   Autumn Messing III, MD  ibuprofen (ADVIL,MOTRIN) 200 MG tablet Take 600 mg by mouth every 8 (eight) hours as needed (for headache/pain.).     [provider]  meclizine (ANTIVERT) 25 MG tablet Take 1 tablet (25 mg total) by mouth 4 (four) times daily as needed for dizziness. 08/31/14   Rolland Porter, MD  olmesartan (BENICAR) 40 MG tablet Take 1 tablet (40 mg total) by mouth daily. 08/14/14   Hilty, Nadean Corwin, MD  simvastatin (ZOCOR) 40 MG tablet Take 1 tablet (40 mg total) by mouth daily. Patient taking differently: Take 40 mg by mouth at bedtime.  05/21/15   Hilty, Nadean Corwin, MD  temazepam (RESTORIL) 30 MG capsule Take 30 mg by mouth at bedtime.      [provider]  VIIBRYD 40 MG TABS Take 40 mg by mouth at bedtime. 05/06/17   [provider]      Allergies    Penicillins    Review of Systems   Review of Systems  All other systems reviewed and are negative.   Physical Exam Updated Vital Signs BP 131/88   Pulse 73   Temp 97.7 F (36.5 C) (Oral)   Resp 17   Ht '5\' 4"'$  (1.626 m)   Wt 82.1 kg   SpO2 99%   BMI 31.07 kg/m  Physical Exam Vitals and nursing note reviewed.  Constitutional:      Appearance: Normal appearance.  HENT:     Head: Normocephalic and atraumatic.   Eyes:     General:        Right eye: No discharge.  Left eye: No discharge.     Conjunctiva/sclera: Conjunctivae normal.  Pulmonary:     Effort: Pulmonary effort is normal.  Skin:    General: Skin is warm and dry.     Findings: No rash.  Neurological:     General: No focal deficit present.     Mental Status: She is alert.  Psychiatric:        Mood and Affect: Mood normal.        Behavior: Behavior normal.     ED Results / Procedures / Treatments   Labs (all labs ordered are listed, but only abnormal results are displayed) Labs Reviewed - No data to display  EKG None  Radiology No results found.  Procedures .Foreign Body Removal  Date/Time: 07/04/2022 7:37 PM  Performed by: Hendricks Limes, PA-C Authorized by: Hendricks Limes, PA-C  Consent: Verbal consent obtained. Consent given by: patient Patient  understanding: patient states understanding of the procedure being performed Patient consent: the patient's understanding of the procedure matches consent given Procedure consent: procedure consent matches procedure scheduled Relevant documents: relevant documents not present or verified Test results: test results not available Site marked: the operative site was not marked Imaging studies: imaging studies not available Patient identity confirmed: verbally with patient and arm band Intake: scalp. Anesthesia: local infiltration  Anesthesia: Local Anesthetic: lidocaine 1% without epinephrine Anesthetic total: 2 mL  Sedation: Patient sedated: no  Patient restrained: no Patient cooperative: yes Complexity: simple 1 objects recovered. Objects recovered: fish hooks Post-procedure assessment: foreign body removed Patient tolerance: patient tolerated the procedure well with no immediate complications      Medications Ordered in ED Medications  lidocaine (PF) (XYLOCAINE) 1 % injection 10 mL (10 mLs Infiltration Given 07/04/22 1926)  Tdap (BOOSTRIX) injection 0.5 mL (0.5 mLs Intramuscular Given 07/04/22 1925)    ED Course/ Medical Decision Making/ A&P                           Medical Decision Making Risk Prescription drug management.  Katherine Short is a 71 y.o. female patient presents the [facility] apartment with a fishhook foreign body.  I remove the fishhook after anesthetizing the area.  Please see procedure note above for further detail.  Bleeding was controlled.  She tolerated procedure well.  She is safe for discharge.   Final Clinical Impression(s) / ED Diagnoses Final diagnoses:  Fish hook in scalp    Rx / DC Orders ED Discharge Orders     None         Hendricks Limes, [place] 07/04/22 1942    Varney Biles, MD 07/05/22 1523

## 2022-07-04 NOTE — ED Triage Notes (Signed)
Pt presents to ED with fishing hook to back of head.  Last tetanus shot unknown

## 2022-07-15 ENCOUNTER — Ambulatory Visit: Payer: TRICARE For Life (TFL) | Admitting: Nutrition

## 2022-08-08 ENCOUNTER — Other Ambulatory Visit: Payer: Self-pay

## 2022-08-08 ENCOUNTER — Encounter (HOSPITAL_COMMUNITY)
Admission: EM | Disposition: A | Discharge status: Home / Self Care | Payer: Self-pay | Source: Home / Self Care | Attending: Cardiology

## 2022-08-08 ENCOUNTER — Emergency Department (HOSPITAL_COMMUNITY): Payer: Medicare HMO

## 2022-08-08 ENCOUNTER — Inpatient Hospital Stay (HOSPITAL_COMMUNITY)
Admission: EM | Admit: 2022-08-08 | Discharge: 2022-08-10 | DRG: 247 | Disposition: A | Discharge status: Home / Self Care | Payer: Medicare HMO | Attending: Cardiology | Admitting: Cardiology

## 2022-08-08 DIAGNOSIS — E7800 Pure hypercholesterolemia, unspecified: Secondary | ICD-10-CM | POA: Diagnosis present

## 2022-08-08 DIAGNOSIS — Z955 Presence of coronary angioplasty implant and graft: Secondary | ICD-10-CM

## 2022-08-08 DIAGNOSIS — I213 ST elevation (STEMI) myocardial infarction of unspecified site: Secondary | ICD-10-CM | POA: Diagnosis not present

## 2022-08-08 DIAGNOSIS — Z880 Allergy status to penicillin: Secondary | ICD-10-CM

## 2022-08-08 DIAGNOSIS — I2510 Atherosclerotic heart disease of native coronary artery without angina pectoris: Secondary | ICD-10-CM | POA: Diagnosis present

## 2022-08-08 DIAGNOSIS — K219 Gastro-esophageal reflux disease without esophagitis: Secondary | ICD-10-CM | POA: Diagnosis present

## 2022-08-08 DIAGNOSIS — Z20822 Contact with and (suspected) exposure to covid-19: Secondary | ICD-10-CM | POA: Diagnosis present

## 2022-08-08 DIAGNOSIS — Z79899 Other long term (current) drug therapy: Secondary | ICD-10-CM

## 2022-08-08 DIAGNOSIS — I10 Essential (primary) hypertension: Secondary | ICD-10-CM | POA: Diagnosis present

## 2022-08-08 DIAGNOSIS — I2111 ST elevation (STEMI) myocardial infarction involving right coronary artery: Secondary | ICD-10-CM | POA: Diagnosis not present

## 2022-08-08 DIAGNOSIS — Z833 Family history of diabetes mellitus: Secondary | ICD-10-CM

## 2022-08-08 DIAGNOSIS — E785 Hyperlipidemia, unspecified: Secondary | ICD-10-CM | POA: Diagnosis present

## 2022-08-08 DIAGNOSIS — Z809 Family history of malignant neoplasm, unspecified: Secondary | ICD-10-CM

## 2022-08-08 HISTORY — PX: CORONARY/GRAFT ACUTE MI REVASCULARIZATION: CATH118305

## 2022-08-08 HISTORY — PX: LEFT HEART CATH AND CORONARY ANGIOGRAPHY: CATH118249

## 2022-08-08 LAB — LIPID PANEL
Cholesterol: 133 mg/dL (ref 0–200)
HDL: 39 mg/dL — ABNORMAL LOW (ref >40)
LDL Cholesterol: 56 mg/dL (ref 0–99)
Total CHOL/HDL Ratio: 3.4 RATIO
Triglycerides: 188 mg/dL — ABNORMAL HIGH (ref <150)
VLDL: 38 mg/dL (ref 0–40)

## 2022-08-08 LAB — COMPREHENSIVE METABOLIC PANEL
ALT: 22 U/L (ref 0–44)
AST: 24 U/L (ref 15–41)
Albumin: 4 g/dL (ref 3.5–5.0)
Alkaline Phosphatase: 69 U/L (ref 38–126)
Anion gap: 9 (ref 5–15)
BUN: 17 mg/dL (ref 8–23)
CO2: 22 mmol/L (ref 22–32)
Calcium: 9.1 mg/dL (ref 8.9–10.3)
Chloride: 108 mmol/L (ref 98–111)
Creatinine, Ser: 0.96 mg/dL (ref 0.44–1.00)
GFR, Estimated: >60 mL/min (ref >60)
Glucose, Bld: 112 mg/dL — ABNORMAL HIGH (ref 70–99)
Potassium: 3.7 mmol/L (ref 3.5–5.1)
Sodium: 139 mmol/L (ref 135–145)
Total Bilirubin: 0.5 mg/dL (ref 0.3–1.2)
Total Protein: 7.5 g/dL (ref 6.5–8.1)

## 2022-08-08 LAB — CBC WITH DIFFERENTIAL/PLATELET
Abs Immature Granulocytes: 0.02 10*3/uL (ref 0.00–0.07)
Basophils Absolute: 0.1 10*3/uL (ref 0.0–0.1)
Basophils Relative: 1 %
Eosinophils Absolute: 0.3 10*3/uL (ref 0.0–0.5)
Eosinophils Relative: 2 %
HCT: 42.9 % (ref 36.0–46.0)
Hemoglobin: 14.3 g/dL (ref 12.0–15.0)
Immature Granulocytes: 0 %
Lymphocytes Relative: 58 %
Lymphs Abs: 7 10*3/uL — ABNORMAL HIGH (ref 0.7–4.0)
MCH: 30.6 pg (ref 26.0–34.0)
MCHC: 33.3 g/dL (ref 30.0–36.0)
MCV: 91.7 fL (ref 80.0–100.0)
Monocytes Absolute: 1 10*3/uL (ref 0.1–1.0)
Monocytes Relative: 8 %
Neutro Abs: 3.8 10*3/uL (ref 1.7–7.7)
Neutrophils Relative %: 31 %
Platelets: 305 10*3/uL (ref 150–400)
RBC: 4.68 MIL/uL (ref 3.87–5.11)
RDW: 13.5 % (ref 11.5–15.5)
WBC: 12.2 10*3/uL — ABNORMAL HIGH (ref 4.0–10.5)
nRBC: 0 % (ref 0.0–0.2)

## 2022-08-08 LAB — APTT: aPTT: 26 seconds (ref 24–36)

## 2022-08-08 LAB — PROTIME-INR
INR: 1 (ref 0.8–1.2)
Prothrombin Time: 13 seconds (ref 11.4–15.2)

## 2022-08-08 LAB — RESP PANEL BY RT-PCR (FLU A&B, COVID) ARPGX2
Influenza A by PCR: NEGATIVE
Influenza B by PCR: NEGATIVE
SARS Coronavirus 2 by RT PCR: NEGATIVE

## 2022-08-08 LAB — TROPONIN I (HIGH SENSITIVITY): Troponin I (High Sensitivity): 79 ng/L — ABNORMAL HIGH (ref <18)

## 2022-08-08 SURGERY — CORONARY/GRAFT ACUTE MI REVASCULARIZATION
Anesthesia: LOCAL

## 2022-08-08 MED ORDER — NITROGLYCERIN 0.4 MG SL SUBL
0.4000 mg | SUBLINGUAL_TABLET | SUBLINGUAL | Status: DC | PRN
  Administered 2022-08-08: 0.4 mg via SUBLINGUAL
  Filled 2022-08-08: qty 1

## 2022-08-08 MED ORDER — ONDANSETRON HCL 4 MG/2ML IJ SOLN
4.0000 mg | Freq: Once | INTRAMUSCULAR | Status: AC
  Administered 2022-08-08: 4 mg via INTRAVENOUS
  Filled 2022-08-08: qty 2

## 2022-08-08 MED ORDER — MORPHINE SULFATE (PF) 4 MG/ML IV SOLN
INTRAVENOUS | Status: AC
  Filled 2022-08-08: qty 1

## 2022-08-08 MED ORDER — HEPARIN SODIUM (PORCINE) 5000 UNIT/ML IJ SOLN
4000.0000 [IU] | Freq: Once | INTRAMUSCULAR | Status: AC
Start: 2022-08-08 — End: 2022-08-08
  Administered 2022-08-08: 4000 [IU] via INTRAVENOUS
  Filled 2022-08-08: qty 1

## 2022-08-08 MED ORDER — ASPIRIN 81 MG PO CHEW
324.0000 mg | CHEWABLE_TABLET | Freq: Once | ORAL | Status: AC
Start: 2022-08-08 — End: 2022-08-08
  Administered 2022-08-08: 324 mg via ORAL
  Filled 2022-08-08: qty 4

## 2022-08-08 MED ORDER — MORPHINE SULFATE (PF) 4 MG/ML IV SOLN
4.0000 mg | Freq: Once | INTRAVENOUS | Status: AC
  Administered 2022-08-08: 4 mg via INTRAVENOUS

## 2022-08-08 MED ORDER — SODIUM CHLORIDE 0.9 % IV SOLN: INTRAVENOUS | Status: DC

## 2022-08-08 SURGICAL SUPPLY — 19 items
BALL SAPPHIRE NC24 2.75X15 (BALLOONS) ×2
BALLN SAPPHIRE 2.0X12 (BALLOONS) ×2
BALLOON SAPPHIRE 2.0X12 (BALLOONS) IMPLANT
BALLOON SAPPHIRE NC24 2.75X15 (BALLOONS) IMPLANT
CATH 5FR JL3.5 JR4 ANG PIG MP (CATHETERS) ×1 IMPLANT
CATH VISTA GUIDE 6FR JR4 (CATHETERS) ×1 IMPLANT
DEVICE RAD COMP TR BAND LRG (VASCULAR PRODUCTS) ×1 IMPLANT
ELECT DEFIB PAD ADLT CADENCE (PAD) ×1 IMPLANT
GLIDESHEATH SLEND SS 6F .021 (SHEATH) ×1 IMPLANT
GUIDEWIRE INQWIRE 1.5J.035X260 (WIRE) IMPLANT
INQWIRE 1.5J .035X260CM (WIRE) ×2
KIT ENCORE 26 ADVANTAGE (KITS) ×1 IMPLANT
KIT HEART LEFT (KITS) ×2 IMPLANT
PACK CARDIAC CATHETERIZATION (CUSTOM PROCEDURE TRAY) ×2 IMPLANT
STENT SYNERGY XD 2.50X20 (Permanent Stent) IMPLANT
SYNERGY XD 2.50X20 (Permanent Stent) ×2 IMPLANT
TRANSDUCER W/STOPCOCK (MISCELLANEOUS) ×2 IMPLANT
TUBING CIL FLEX 10 FLL-RA (TUBING) ×2 IMPLANT
WIRE ASAHI PROWATER 180CM (WIRE) ×2 IMPLANT

## 2022-08-08 NOTE — ED Triage Notes (Addendum)
Pov from home. Cc of chest pain around 430pm that has worsened during the day. Extreme pain 58mn pta.  Diaphoretic.

## 2022-08-08 NOTE — ED Provider Notes (Signed)
[facility] EMERGENCY DEPARTMENT Provider Note   CSN: [id number] Arrival date & time: 08/08/22  2225     History  Chief Complaint  Patient presents with   Chest Pain    Katherine Short is a 71 y.o. female.   Chest Pain Pain location:  Substernal area Pain quality: aching, dull and pressure   Pain radiates to:  L arm and R arm Pain severity:  Severe Duration:  6 hours Timing:  Intermittent Progression:  Worsening Chronicity:  New Worsened by:  Nothing Associated symptoms: nausea   Risk factors: high cholesterol        Home Medications Prior to Admission medications   Medication Sig Start Date End Date Taking? Authorizing Provider  acyclovir (ZOVIRAX) 200 MG capsule Take 200 mg by mouth 2 (two) times daily.     [provider]  buPROPion (WELLBUTRIN SR) 150 MG 12 hr tablet Take 150 mg by mouth 2 (two) times daily. 06/05/17   [provider]  CONTRAVE 8-90 MG TB12 Take 2 tablets by mouth 2 (two) times daily. 06/22/17   [provider]  cyclobenzaprine (FLEXERIL) 5 MG tablet Take 1 tablet (5 mg total) by mouth 3 (three) times daily as needed for muscle spasms. Do not drink alcohol or drive while taking this medication.  May cause drowsiness. 11/10/21   Volney American, PA-C  Estradiol-Norethindrone Acet (ACTIVELLA) 0.5-0.1 MG per tablet Take 1 tablet by mouth at bedtime.     [provider]  gabapentin (NEURONTIN) 300 MG capsule Take 300 mg by mouth 2 (two) times daily. 06/05/17   [provider]  HYDROcodone-acetaminophen (NORCO/VICODIN) 5-325 MG tablet Take 1-2 tablets by mouth every 4 (four) hours as needed for moderate pain or severe pain. 07/07/17   Jovita Kussmaul, MD  HYDROcodone-acetaminophen (NORCO/VICODIN) 5-325 MG tablet Take 1-2 tablets by mouth every 4 (four) hours as needed for moderate pain or severe pain. 07/18/17   Autumn Messing III, MD  ibuprofen (ADVIL,MOTRIN) 200 MG tablet Take 600 mg by mouth every 8 (eight)  hours as needed (for headache/pain.).     [provider]  meclizine (ANTIVERT) 25 MG tablet Take 1 tablet (25 mg total) by mouth 4 (four) times daily as needed for dizziness. 08/31/14   Rolland Porter, MD  olmesartan (BENICAR) 40 MG tablet Take 1 tablet (40 mg total) by mouth daily. 08/14/14   Hilty, Nadean Corwin, MD  simvastatin (ZOCOR) 40 MG tablet Take 1 tablet (40 mg total) by mouth daily. Patient taking differently: Take 40 mg by mouth at bedtime.  05/21/15   Hilty, Nadean Corwin, MD  temazepam (RESTORIL) 30 MG capsule Take 30 mg by mouth at bedtime.      [provider]  VIIBRYD 40 MG TABS Take 40 mg by mouth at bedtime. 05/06/17   [provider]      Allergies    Penicillins    Review of Systems   Review of Systems  Cardiovascular:  Positive for chest pain.  Gastrointestinal:  Positive for nausea.    Physical Exam Updated Vital Signs BP (!) 144/78   Pulse 66   Temp 97.7 F (36.5 C)   Resp (!) 29   Ht 1.626 m ('5\' 4"'$ )   Wt 82.1 kg   SpO2 96%   BMI 31.07 kg/m  Physical Exam Vitals and nursing note reviewed.  Constitutional:      Appearance: She is well-developed. She is ill-appearing and diaphoretic.  HENT:     Head: Normocephalic  and atraumatic.     Right Ear: External ear normal.     Left Ear: External ear normal.  Eyes:     General: No scleral icterus.       Right eye: No discharge.        Left eye: No discharge.     Conjunctiva/sclera: Conjunctivae normal.  Neck:     Trachea: No tracheal deviation.  Cardiovascular:     Rate and Rhythm: Normal rate and regular rhythm.  Pulmonary:     Effort: Pulmonary effort is normal. No respiratory distress.     Breath sounds: Normal breath sounds. No stridor. No wheezing or rales.  Abdominal:     General: Bowel sounds are normal. There is no distension.     Palpations: Abdomen is soft.     Tenderness: There is no abdominal tenderness. There is no guarding or rebound.  Musculoskeletal:        General: No  tenderness or deformity.     Cervical back: Neck supple.  Skin:    General: Skin is warm.     Findings: No rash.  Neurological:     General: No focal deficit present.     Mental Status: She is alert.     Cranial Nerves: No cranial nerve deficit (no facial droop, extraocular movements intact, no slurred speech).     Sensory: No sensory deficit.     Motor: No abnormal muscle tone or seizure activity.     Coordination: Coordination normal.  Psychiatric:        Mood and Affect: Mood normal.     ED Results / Procedures / Treatments   Labs (all labs ordered are listed, but only abnormal results are displayed) Labs Reviewed  CBC WITH DIFFERENTIAL/PLATELET - Abnormal; Notable for the following components:      Result Value   WBC 12.2 (*)    All other components within normal limits  RESP PANEL BY RT-PCR (FLU A&B, COVID) ARPGX2  PROTIME-INR  APTT  HEMOGLOBIN A1C  COMPREHENSIVE METABOLIC PANEL  LIPID PANEL  TROPONIN I (HIGH SENSITIVITY)    EKG EKG Interpretation  Date/Time:  Sunday August 08 2022 22:37:34 EDT Ventricular Rate:  60 PR Interval:  196 QRS Duration: 101 QT Interval:  413 QTC Calculation: 413 R Axis:   85 Text Interpretation: Sinus rhythm Inferior infarct, acute (RCA) Probable RV involvement, suggest recording right precordial leads >>> Acute MI <<< acute mi findings are new compared to last tracing Confirmed by Dorie Rank [phone]) on 08/08/2022 10:47:30 PM  Radiology No results found.  Procedures .Critical Care  Performed by: Dorie Rank, MD Authorized by: Dorie Rank, MD   Critical care provider statement:    Critical care time (minutes):  30   Critical care was time spent personally by me on the following activities:  Development of treatment plan with patient or surrogate, discussions with consultants, evaluation of patient's response to treatment, examination of patient, ordering and review of laboratory studies, ordering and review of radiographic studies,  ordering and performing treatments and interventions, pulse oximetry, re-evaluation of patient's condition and review of old charts     Medications Ordered in ED Medications  0.9 %  sodium chloride infusion ( Intravenous New Bag/Given 08/08/22 2246)  morphine (PF) 4 MG/ML injection (has no administration in time range)  ondansetron (ZOFRAN) injection 4 mg (has no administration in time range)  nitroGLYCERIN (NITROSTAT) SL tablet 0.4 mg (has no administration in time range)  aspirin chewable tablet 324 mg (324 mg Oral Given  08/08/22 2244)  heparin injection 4,000 Units (4,000 Units Intravenous Given 08/08/22 2244)  morphine (PF) 4 MG/ML injection 4 mg (4 mg Intravenous Given 08/08/22 2250)    ED Course/ Medical Decision Making/ A&P Clinical Course as of 08/08/22 2258  Sun Aug 08, 2022  2243 EKG consistent with STEMI.  COde stemi activated  [JK]  2258 Case discussed with Dr. [place].  Patient will be emergently transferred to [facility] [JK]  2258 White blood cell count elevated [JK]    Clinical Course User Index [JK] Dorie Rank, MD                           Medical Decision Making Problems Addressed: ST elevation myocardial infarction (STEMI), unspecified artery [facility]): acute illness or injury that poses a threat to life or bodily functions  Amount and/or Complexity of Data Reviewed Labs: ordered. Radiology: ordered.  Risk OTC drugs. Prescription drug management.   Presents with acute chest pain.  Symptoms started this afternoon waxing and waning somewhat.  Became more severe.  Patient feels pain radiate to her arms.  EKG is concerning for ST elevation myocardial infarction.  She has ST elevation in the inferior leads with reciprocal changes.  Code STEMI has been activated.  Patient has been given heparin and morphine.  We will plan on emergent transfer to [facility]        Final Clinical Impression(s) / ED Diagnoses Final diagnoses:  ST elevation myocardial  infarction (STEMI), unspecified artery [facility])    Rx / DC Orders ED Discharge Orders     None         Dorie Rank, MD 08/08/22 2259

## 2022-08-08 NOTE — ED Notes (Signed)
Belongings given to pts husband at bedside, home medications, shirt, shorts, purse, and pair of shoes.

## 2022-08-08 NOTE — ED Notes (Signed)
ED Provider at bedside. 

## 2022-08-08 NOTE — ED Notes (Signed)
Report given to [place] @ [facility] cath lab.

## 2022-08-08 NOTE — ED Notes (Signed)
[facility] arrived to transport pt.

## 2022-08-09 ENCOUNTER — Inpatient Hospital Stay (HOSPITAL_COMMUNITY): Payer: Medicare HMO

## 2022-08-09 ENCOUNTER — Encounter (HOSPITAL_COMMUNITY): Payer: Self-pay | Admitting: Cardiology

## 2022-08-09 DIAGNOSIS — Z79899 Other long term (current) drug therapy: Secondary | ICD-10-CM | POA: Diagnosis not present

## 2022-08-09 DIAGNOSIS — I2510 Atherosclerotic heart disease of native coronary artery without angina pectoris: Secondary | ICD-10-CM

## 2022-08-09 DIAGNOSIS — I2111 ST elevation (STEMI) myocardial infarction involving right coronary artery: Secondary | ICD-10-CM | POA: Diagnosis present

## 2022-08-09 DIAGNOSIS — Z20822 Contact with and (suspected) exposure to covid-19: Secondary | ICD-10-CM | POA: Diagnosis present

## 2022-08-09 DIAGNOSIS — Z833 Family history of diabetes mellitus: Secondary | ICD-10-CM | POA: Diagnosis not present

## 2022-08-09 DIAGNOSIS — K219 Gastro-esophageal reflux disease without esophagitis: Secondary | ICD-10-CM | POA: Diagnosis present

## 2022-08-09 DIAGNOSIS — I213 ST elevation (STEMI) myocardial infarction of unspecified site: Secondary | ICD-10-CM | POA: Diagnosis present

## 2022-08-09 DIAGNOSIS — I10 Essential (primary) hypertension: Secondary | ICD-10-CM | POA: Diagnosis present

## 2022-08-09 DIAGNOSIS — Z809 Family history of malignant neoplasm, unspecified: Secondary | ICD-10-CM | POA: Diagnosis not present

## 2022-08-09 DIAGNOSIS — E7800 Pure hypercholesterolemia, unspecified: Secondary | ICD-10-CM | POA: Diagnosis present

## 2022-08-09 DIAGNOSIS — Z880 Allergy status to penicillin: Secondary | ICD-10-CM | POA: Diagnosis not present

## 2022-08-09 DIAGNOSIS — E785 Hyperlipidemia, unspecified: Secondary | ICD-10-CM | POA: Diagnosis present

## 2022-08-09 LAB — CBC
HCT: 37.8 % (ref 36.0–46.0)
Hemoglobin: 12.4 g/dL (ref 12.0–15.0)
MCH: 30.4 pg (ref 26.0–34.0)
MCHC: 32.8 g/dL (ref 30.0–36.0)
MCV: 92.6 fL (ref 80.0–100.0)
Platelets: 259 10*3/uL (ref 150–400)
RBC: 4.08 MIL/uL (ref 3.87–5.11)
RDW: 13.2 % (ref 11.5–15.5)
WBC: 10.8 10*3/uL — ABNORMAL HIGH (ref 4.0–10.5)
nRBC: 0 % (ref 0.0–0.2)

## 2022-08-09 LAB — BASIC METABOLIC PANEL
Anion gap: 7 (ref 5–15)
BUN: 14 mg/dL (ref 8–23)
CO2: 23 mmol/L (ref 22–32)
Calcium: 8.5 mg/dL — ABNORMAL LOW (ref 8.9–10.3)
Chloride: 108 mmol/L (ref 98–111)
Creatinine, Ser: 0.87 mg/dL (ref 0.44–1.00)
GFR, Estimated: >60 mL/min (ref >60)
Glucose, Bld: 170 mg/dL — ABNORMAL HIGH (ref 70–99)
Potassium: 4.1 mmol/L (ref 3.5–5.1)
Sodium: 138 mmol/L (ref 135–145)

## 2022-08-09 LAB — HIV ANTIBODY (ROUTINE TESTING W REFLEX): HIV Screen 4th Generation wRfx: NONREACTIVE

## 2022-08-09 LAB — HEMOGLOBIN A1C
Hgb A1c MFr Bld: 5.7 % — ABNORMAL HIGH (ref 4.8–5.6)
Mean Plasma Glucose: 116.89 mg/dL

## 2022-08-09 LAB — ECHOCARDIOGRAM COMPLETE
Area-P 1/2: 3.95 cm2
Height: 64 in
S' Lateral: 3.3 cm
Weight: 2927.71 oz

## 2022-08-09 LAB — POCT ACTIVATED CLOTTING TIME: Activated Clotting Time: 432 seconds

## 2022-08-09 LAB — MRSA NEXT GEN BY PCR, NASAL: MRSA by PCR Next Gen: NOT DETECTED

## 2022-08-09 LAB — TROPONIN I (HIGH SENSITIVITY)
Troponin I (High Sensitivity): 364 ng/L (ref <18)
Troponin I (High Sensitivity): 1227 ng/L (ref <18)

## 2022-08-09 LAB — GLUCOSE, CAPILLARY: Glucose-Capillary: 144 mg/dL — ABNORMAL HIGH (ref 70–99)

## 2022-08-09 MED ORDER — METOPROLOL TARTRATE 12.5 MG HALF TABLET
12.5000 mg | ORAL_TABLET | Freq: Two times a day (BID) | ORAL | Status: DC
  Administered 2022-08-09 – 2022-08-10 (×3): 12.5 mg via ORAL
  Filled 2022-08-09 (×3): qty 1

## 2022-08-09 MED ORDER — TICAGRELOR 90 MG PO TABS
90.0000 mg | ORAL_TABLET | Freq: Two times a day (BID) | ORAL | Status: DC
  Administered 2022-08-09 – 2022-08-10 (×3): 90 mg via ORAL
  Filled 2022-08-09 (×3): qty 1

## 2022-08-09 MED ORDER — NITROGLYCERIN 1 MG/10 ML FOR IR/CATH LAB
INTRA_ARTERIAL | Status: DC | PRN
  Administered 2022-08-09: 200 ug via INTRACORONARY

## 2022-08-09 MED ORDER — SODIUM CHLORIDE 0.9% FLUSH
3.0000 mL | Freq: Two times a day (BID) | INTRAVENOUS | Status: DC
  Administered 2022-08-09 – 2022-08-10 (×3): 3 mL via INTRAVENOUS

## 2022-08-09 MED ORDER — ACETAMINOPHEN 325 MG PO TABS: 650.0000 mg | ORAL_TABLET | ORAL | Status: DC | PRN

## 2022-08-09 MED ORDER — ACETAMINOPHEN 325 MG PO TABS
650.0000 mg | ORAL_TABLET | ORAL | Status: DC | PRN
Start: 2022-08-09 — End: 2022-08-10

## 2022-08-09 MED ORDER — VERAPAMIL HCL 2.5 MG/ML IV SOLN
INTRAVENOUS | Status: DC | PRN
  Administered 2022-08-09: 10 mL via INTRA_ARTERIAL

## 2022-08-09 MED ORDER — HEPARIN SODIUM (PORCINE) 1000 UNIT/ML IJ SOLN
INTRAMUSCULAR | Status: DC | PRN
  Administered 2022-08-09: 8000 [IU] via INTRAVENOUS

## 2022-08-09 MED ORDER — NITROGLYCERIN 0.4 MG SL SUBL: 0.4000 mg | SUBLINGUAL_TABLET | SUBLINGUAL | Status: DC | PRN

## 2022-08-09 MED ORDER — HEPARIN (PORCINE) IN NACL 1000-0.9 UT/500ML-% IV SOLN
INTRAVENOUS | Status: AC
  Filled 2022-08-09: qty 1000

## 2022-08-09 MED ORDER — VERAPAMIL HCL 2.5 MG/ML IV SOLN
INTRAVENOUS | Status: AC
  Filled 2022-08-09: qty 2

## 2022-08-09 MED ORDER — TICAGRELOR 90 MG PO TABS
ORAL_TABLET | ORAL | Status: AC
  Filled 2022-08-09: qty 2

## 2022-08-09 MED ORDER — NITROGLYCERIN 1 MG/10 ML FOR IR/CATH LAB
INTRA_ARTERIAL | Status: AC
  Filled 2022-08-09: qty 10

## 2022-08-09 MED ORDER — SODIUM CHLORIDE 0.9 % IV SOLN
25.0000 mg | Freq: Four times a day (QID) | INTRAVENOUS | Status: DC | PRN
  Administered 2022-08-09: 25 mg via INTRAVENOUS
  Filled 2022-08-09: qty 1

## 2022-08-09 MED ORDER — LIDOCAINE HCL (PF) 1 % IJ SOLN
INTRAMUSCULAR | Status: AC
  Filled 2022-08-09: qty 30

## 2022-08-09 MED ORDER — TICAGRELOR 90 MG PO TABS
ORAL_TABLET | ORAL | Status: AC
  Filled 2022-08-09: qty 1

## 2022-08-09 MED ORDER — ONDANSETRON HCL 4 MG/2ML IJ SOLN
4.0000 mg | Freq: Four times a day (QID) | INTRAMUSCULAR | Status: DC | PRN
  Administered 2022-08-09 (×2): 4 mg via INTRAVENOUS
  Filled 2022-08-09 (×2): qty 2

## 2022-08-09 MED ORDER — ASPIRIN 81 MG PO TBEC
81.0000 mg | DELAYED_RELEASE_TABLET | Freq: Every day | ORAL | Status: DC
  Administered 2022-08-09 – 2022-08-10 (×2): 81 mg via ORAL
  Filled 2022-08-09 (×2): qty 1

## 2022-08-09 MED ORDER — GABAPENTIN 300 MG PO CAPS
300.0000 mg | ORAL_CAPSULE | Freq: Two times a day (BID) | ORAL | Status: DC
  Administered 2022-08-09 – 2022-08-10 (×3): 300 mg via ORAL
  Filled 2022-08-09 (×3): qty 1

## 2022-08-09 MED ORDER — HEPARIN SODIUM (PORCINE) 5000 UNIT/ML IJ SOLN
5000.0000 [IU] | Freq: Three times a day (TID) | INTRAMUSCULAR | Status: DC
Start: 2022-08-09 — End: 2022-08-10
  Administered 2022-08-09 – 2022-08-10 (×3): 5000 [IU] via SUBCUTANEOUS
  Filled 2022-08-09 (×3): qty 1

## 2022-08-09 MED ORDER — TICAGRELOR 90 MG PO TABS
ORAL_TABLET | ORAL | Status: DC | PRN
  Administered 2022-08-09: 180 mg via ORAL

## 2022-08-09 MED ORDER — VILAZODONE HCL 20 MG PO TABS
40.0000 mg | ORAL_TABLET | Freq: Every day | ORAL | Status: DC
  Filled 2022-08-09 (×2): qty 2

## 2022-08-09 MED ORDER — HEPARIN SODIUM (PORCINE) 1000 UNIT/ML IJ SOLN
INTRAMUSCULAR | Status: AC
  Filled 2022-08-09: qty 10

## 2022-08-09 MED ORDER — ROSUVASTATIN CALCIUM 20 MG PO TABS
40.0000 mg | ORAL_TABLET | Freq: Every day | ORAL | Status: DC
  Administered 2022-08-09 – 2022-08-10 (×2): 40 mg via ORAL
  Filled 2022-08-09 (×2): qty 2

## 2022-08-09 MED ORDER — SODIUM CHLORIDE 0.9 % IV SOLN
250.0000 mL | INTRAVENOUS | Status: DC | PRN
Start: 2022-08-09 — End: 2022-08-10

## 2022-08-09 MED ORDER — ONDANSETRON HCL 4 MG/2ML IJ SOLN: 4.0000 mg | Freq: Four times a day (QID) | INTRAMUSCULAR | Status: DC | PRN

## 2022-08-09 MED ORDER — CHLORHEXIDINE GLUCONATE CLOTH 2 % EX PADS
6.0000 | MEDICATED_PAD | Freq: Every day | CUTANEOUS | Status: DC
Start: 2022-08-09 — End: 2022-08-10
  Administered 2022-08-09 – 2022-08-10 (×2): 6 via TOPICAL

## 2022-08-09 MED ORDER — LIDOCAINE HCL (PF) 1 % IJ SOLN
INTRAMUSCULAR | Status: DC | PRN
  Administered 2022-08-09: 2 mL

## 2022-08-09 MED ORDER — HEPARIN SODIUM (PORCINE) 5000 UNIT/ML IJ SOLN: 5000.0000 [IU] | Freq: Three times a day (TID) | INTRAMUSCULAR | Status: DC

## 2022-08-09 MED ORDER — SODIUM CHLORIDE 0.9 % IV SOLN: INTRAVENOUS | Status: AC

## 2022-08-09 MED ORDER — HEPARIN (PORCINE) IN NACL 1000-0.9 UT/500ML-% IV SOLN
INTRAVENOUS | Status: DC | PRN
  Administered 2022-08-09: 500 mL

## 2022-08-09 MED ORDER — IOHEXOL 350 MG/ML SOLN
INTRAVENOUS | Status: DC | PRN
  Administered 2022-08-09: 105 mL via INTRA_ARTERIAL

## 2022-08-09 MED ORDER — ORAL CARE MOUTH RINSE: 15.0000 mL | OROMUCOSAL | Status: DC | PRN

## 2022-08-09 MED ORDER — BUPROPION HCL ER (SR) 150 MG PO TB12
150.0000 mg | ORAL_TABLET | Freq: Two times a day (BID) | ORAL | Status: DC
  Filled 2022-08-09 (×4): qty 1

## 2022-08-09 MED ORDER — SODIUM CHLORIDE 0.9% FLUSH
3.0000 mL | INTRAVENOUS | Status: DC | PRN
Start: 2022-08-09 — End: 2022-08-10

## 2022-08-09 NOTE — Progress Notes (Signed)
Echocardiogram 2D Echocardiogram has been performed.  Oneal Deputy Cottrell Gentles RDCS 08/09/2022, 10:44 AM

## 2022-08-09 NOTE — H&P (Signed)
Cardiology Admission History and Physical:   Patient ID: Katherine Short MRN: [id number]; DOB: Feb 24, 1951   Admission date: 08/08/2022  PCP:  Celene Squibb, MD   [facility] Providers Cardiologist:  None  [place]    Chief Complaint:  chest pain  Patient Profile:   Katherine Short is a 71 y.o. female with history of HTN and HLD who is being seen 08/09/2022 for the evaluation of acute inferior STEMI.  History of Present Illness:   Katherine Short has a history of HTN and HLD. No prior cardiac history. Negative Myoview and Echo in 2011. This afternoon around 4:30 she developed acute onset of chest pain. Initially waxed and waned then intensified. Presented to ED at [place] demonstrated inferior ST elevation with reciprocal changes anteriorly. Patient was having 10/10 pain associated with N/V. Given ASA, IV heparin, Ntg, Morphine and transferred to our cath lab. Still having severe chest pain.   Past Medical History:  Diagnosis Date   Anxiety    Chest pain 03/19/2010   nuclear study was negative   Depression    Dyslipidemia    GERD (gastroesophageal reflux disease)    Headache(784.0)    Hypertension     Past Surgical History:  Procedure Laterality Date   BREAST LUMPECTOMY WITH RADIOACTIVE SEED LOCALIZATION Left 07/07/2017   Procedure: LEFT BREAST LUMPECTOMY WITH RADIOACTIVE SEED LOCALIZATION;  Surgeon: Jovita Kussmaul, MD;  Location: [place];  Service: General;  Laterality: Left;   BREAST LUMPECTOMY WITH RADIOACTIVE SEED LOCALIZATION Left 07/18/2017   Procedure: LEFT BREAST LUMPECTOMY WITH RADIOACTIVE SEED LOCALIZATION;  Surgeon: Jovita Kussmaul, MD;  Location: [place];  Service: General;  Laterality: Left;   CATARACT EXTRACTION W/PHACO  11/22/2011   Procedure: CATARACT EXTRACTION PHACO AND INTRAOCULAR LENS PLACEMENT ([place]);  Surgeon: Tonny Branch;  Location: [facility];  Service: Ophthalmology;  Laterality: Left;  CDE:7.76   CATARACT EXTRACTION W/PHACO   12/02/2011   Procedure: CATARACT EXTRACTION PHACO AND INTRAOCULAR LENS PLACEMENT ([facility]);  Surgeon: Tonny Branch;  Location: [facility];  Service: Ophthalmology;  Laterality: Right;  CDE=11.92   COLONOSCOPY     TUBAL LIGATION       Medications Prior to Admission: Prior to Admission medications   Medication Sig Start Date End Date Taking? Authorizing Provider  acyclovir (ZOVIRAX) 200 MG capsule Take 200 mg by mouth 2 (two) times daily.     [provider]  buPROPion (WELLBUTRIN SR) 150 MG 12 hr tablet Take 150 mg by mouth 2 (two) times daily. 06/05/17   [provider]  CONTRAVE 8-90 MG TB12 Take 2 tablets by mouth 2 (two) times daily. 06/22/17   [provider]  cyclobenzaprine (FLEXERIL) 5 MG tablet Take 1 tablet (5 mg total) by mouth 3 (three) times daily as needed for muscle spasms. Do not drink alcohol or drive while taking this medication.  May cause drowsiness. 11/10/21   Volney American, PA-C  Estradiol-Norethindrone Acet (ACTIVELLA) 0.5-0.1 MG per tablet Take 1 tablet by mouth at bedtime.     [provider]  gabapentin (NEURONTIN) 300 MG capsule Take 300 mg by mouth 2 (two) times daily. 06/05/17   [provider]  HYDROcodone-acetaminophen (NORCO/VICODIN) 5-325 MG tablet Take 1-2 tablets by mouth every 4 (four) hours as needed for moderate pain or severe pain. 07/07/17   Jovita Kussmaul, MD  HYDROcodone-acetaminophen (NORCO/VICODIN) 5-325 MG tablet Take 1-2 tablets by mouth every 4 (four) hours as needed for moderate pain or severe  pain. 07/18/17   Autumn Messing III, MD  ibuprofen (ADVIL,MOTRIN) 200 MG tablet Take 600 mg by mouth every 8 (eight) hours as needed (for headache/pain.).     [provider]  meclizine (ANTIVERT) 25 MG tablet Take 1 tablet (25 mg total) by mouth 4 (four) times daily as needed for dizziness. 08/31/14   Rolland Porter, MD  olmesartan (BENICAR) 40 MG tablet Take 1 tablet (40 mg total) by mouth daily. 08/14/14   Hilty, Nadean Corwin,  MD  simvastatin (ZOCOR) 40 MG tablet Take 1 tablet (40 mg total) by mouth daily. Patient taking differently: Take 40 mg by mouth at bedtime.  05/21/15   Hilty, Nadean Corwin, MD  temazepam (RESTORIL) 30 MG capsule Take 30 mg by mouth at bedtime.      [provider]  VIIBRYD 40 MG TABS Take 40 mg by mouth at bedtime. 05/06/17   [provider]     Allergies:    Allergies  Allergen Reactions   Penicillins Itching and Rash         Social History:   Social History   Socioeconomic History   Marital status: Married    Spouse name: Not on file   Number of children: Not on file   Years of education: Not on file   Highest education level: Not on file  Occupational History   Not on file  Tobacco Use   Smoking status: Never   Smokeless tobacco: Never  Vaping Use   Vaping Use: Never used  Substance and Sexual Activity   Alcohol use: No   Drug use: No   Sexual activity: Not on file  Other Topics Concern   Not on file  Social History Narrative   Not on file   Social Determinants of Health   Financial Resource Strain: Not on file  Food Insecurity: Not on file  Transportation Needs: Not on file  Physical Activity: Not on file  Stress: Not on file  Social Connections: Not on file  Intimate Partner Violence: Not on file    Family History:   The patient's family history includes Cancer (age of onset: 64) in her mother; Diabetes in her father.    ROS:  Please see the history of present illness.  All other ROS reviewed and negative.     Physical Exam/Data:   Vitals:   08/09/22 0040 08/09/22 0045 08/09/22 0050 08/09/22 0055  BP: 119/77 108/69 112/75 112/75  Pulse: 76 73 75 (!) 0  Resp: '13 16 15   '$ Temp:      SpO2: (!) 83% (!) 84% (!) 83%   Weight:      Height:       No intake or output data in the 24 hours ending 08/09/22 0121    08/08/2022   10:38 PM 07/04/2022    6:57 PM 01/14/2022    8:12 AM  Last 3 Weights  Weight (lbs) 181 lb 181 lb 178 lb 12.8 oz   Weight (kg) 82.1 kg 82.101 kg 81.103 kg     Body mass index is 31.07 kg/m.  General:  Well nourished, well developed, in severe distress HEENT: normal Neck: no JVD Vascular: No carotid bruits; Distal pulses 2+ bilaterally   Cardiac:  normal S1, S2; RRR; no murmur  Lungs:  clear to auscultation bilaterally, no wheezing, rhonchi or rales  Abd: soft, nontender, no hepatomegaly  Ext: no edema Musculoskeletal:  No deformities, BUE and BLE strength normal and equal Skin: warm and dry  Neuro:  CNs 2-12 intact, no focal abnormalities noted Psych:  Normal affect    EKG:  The ECG that was done today was personally reviewed and demonstrates NSR with ST elevation in inferior leads c/w STEMI  Relevant CV Studies: none  Laboratory Data:  High Sensitivity Troponin:   Recent Labs  Lab 08/08/22 2238  TROPONINIHS 79*      Chemistry Recent Labs  Lab 08/08/22 2238  NA 139  K 3.7  CL 108  CO2 22  GLUCOSE 112*  BUN 17  CREATININE 0.96  CALCIUM 9.1  GFRNONAA >60  ANIONGAP 9    Recent Labs  Lab 08/08/22 2238  PROT 7.5  ALBUMIN 4.0  AST 24  ALT 22  ALKPHOS 69  BILITOT 0.5   Lipids  Recent Labs  Lab 08/08/22 2238  CHOL 133  TRIG 188*  HDL 39*  LDLCALC 56  CHOLHDL 3.4   Hematology Recent Labs  Lab 08/08/22 2238  WBC 12.2*  RBC 4.68  HGB 14.3  HCT 42.9  MCV 91.7  MCH 30.6  MCHC 33.3  RDW 13.5  PLT 305   Thyroid No results for input(s): "TSH", "FREET4" in the last 168 hours. BNPNo results for input(s): "BNP", "PROBNP" in the last 168 hours.  DDimer No results for input(s): "DDIMER" in the last 168 hours.   Radiology/Studies:  CARDIAC CATHETERIZATION  Result Date: 08/09/2022   RPAV lesion is 100% stenosed.   Mid RCA lesion is 30% stenosed.   Mid LAD lesion is 60% stenosed.   2nd Diag lesion is 70% stenosed.   1st Diag lesion is 50% stenosed.   1st Mrg lesion is 50% stenosed.   A drug-eluting stent was successfully placed using a SYNERGY XD 2.50X20.   Post  intervention, there is a 0% residual stenosis.   The left ventricular systolic function is normal.   LV end diastolic pressure is mildly elevated.   The left ventricular ejection fraction is 55-65% by visual estimate. 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 Good LV function with inferobasal HK Mildly elevated LVEDP 20 mm Hg Successful PCI of the PL branch of the RCA with DES x 1. Plan: DAPT with ASA and Brilinta for one year. May be a candidate for fast track discharge depending on clinical course.   DG Chest Port 1 View  Result Date: 08/08/2022 CLINICAL DATA:  Chest pain. EXAM: PORTABLE CHEST 1 VIEW COMPARISON:  None Available. FINDINGS: The heart size and mediastinal contours are within normal limits. Mild atelectasis is seen within the left lung base. There is a small left pleural effusion. No pneumothorax is identified. The visualized skeletal structures are unremarkable. IMPRESSION: 1. Mild left basilar atelectasis. 2. Small left pleural effusion. Electronically Signed   By: Virgina Norfolk M.D.   On: 08/08/2022 22:58     Assessment and Plan:   Acute inferior STEMI. Patient underwent emergent cardiac cath with acute occlusion of a large PL branch of the RCA. This was successfully treated with DES with reperfusion and resolution of her chest pain. Initial troponin 79. Will trend and follow Ecg. Check Echo. DAPT for one year. High dose statin. Start beta blocker as tolerated. HTN. Will initiate metoprolol. Hold ARB until we see how BP and renal function do.  HLD will switch Zocor to high dose Crestor.    Risk Assessment/Risk Scores:    TIMI Risk Score for ST  Elevation MI:   The patient's TIMI risk  score is 6, which indicates a 16.1% risk of all cause mortality at 30 days.        Severity of Illness: The appropriate patient status for this patient is INPATIENT. Inpatient status is judged to be reasonable and  necessary in order to provide the required intensity of service to ensure the patient's safety. The patient's presenting symptoms, physical exam findings, and initial radiographic and laboratory data in the context of their chronic comorbidities is felt to place them at high risk for further clinical deterioration. Furthermore, it is not anticipated that the patient will be medically stable for discharge from the hospital within 2 midnights of admission.   * I certify that at the point of admission it is my clinical judgment that the patient will require inpatient hospital care spanning beyond 2 midnights from the point of admission due to high intensity of service, high risk for further deterioration and high frequency of surveillance required.*   For questions or updates, please contact [place] Please consult [email or web address] for contact info under     Signed, Brentlee Delage Martinique, MD  08/09/2022 1:21 AM

## 2022-08-09 NOTE — Progress Notes (Signed)
Interventional cardiology note: Patient admitted this morning by Dr. Martinique.  See his note for details.  The patient is clinically stable after presenting with an acute inferior STEMI treated with primary PCI using a drug-eluting stent in the posterolateral branch of the RCA.  She is hemodynamically stable.  Telemetry is reviewed and she has a few pauses that are less than 3 seconds.  Otherwise maintaining sinus rhythm with a heart rate of 70 bpm this morning.  She is complaining of severe nausea that has been refractory to Zofran.  She will receive a dose of IV Phenergan.  Keep in ICU today.  Appropriately on DAPT with aspirin and ticagrelor, low-dose beta-blocker, and high intensity statin drug.  Sherren Mocha 08/09/2022 9:53 AM

## 2022-08-10 ENCOUNTER — Telehealth (HOSPITAL_COMMUNITY): Payer: Self-pay | Admitting: Pharmacy Technician

## 2022-08-10 ENCOUNTER — Other Ambulatory Visit (HOSPITAL_COMMUNITY): Payer: Self-pay

## 2022-08-10 DIAGNOSIS — I2111 ST elevation (STEMI) myocardial infarction involving right coronary artery: Secondary | ICD-10-CM | POA: Diagnosis not present

## 2022-08-10 LAB — LIPOPROTEIN A (LPA): Lipoprotein (a): 44.9 nmol/L — ABNORMAL HIGH (ref <75.0)

## 2022-08-10 MED ORDER — ASPIRIN 81 MG PO TBEC
81.0000 mg | DELAYED_RELEASE_TABLET | Freq: Every day | ORAL | 2 refills | Status: DC
  Filled 2022-08-10: qty 30, 30d supply, fill #0

## 2022-08-10 MED ORDER — ROSUVASTATIN CALCIUM 40 MG PO TABS
40.0000 mg | ORAL_TABLET | Freq: Every day | ORAL | 1 refills | Status: DC
  Filled 2022-08-10: qty 30, 30d supply, fill #0

## 2022-08-10 MED ORDER — METOPROLOL TARTRATE 25 MG PO TABS
12.5000 mg | ORAL_TABLET | Freq: Two times a day (BID) | ORAL | 0 refills | Status: DC
  Filled 2022-08-10: qty 30, 30d supply, fill #0

## 2022-08-10 MED ORDER — NITROGLYCERIN 0.4 MG SL SUBL
0.4000 mg | SUBLINGUAL_TABLET | SUBLINGUAL | 2 refills | Status: DC | PRN
  Filled 2022-08-10: qty 25, 30d supply, fill #0

## 2022-08-10 MED ORDER — TICAGRELOR 90 MG PO TABS
90.0000 mg | ORAL_TABLET | Freq: Two times a day (BID) | ORAL | 2 refills | Status: DC
  Filled 2022-08-10: qty 60, 30d supply, fill #0

## 2022-08-10 NOTE — Telephone Encounter (Signed)
Pharmacy Patient Advocate Encounter  Insurance verification completed.    The patient is insured through [employer] Part D   The patient is currently admitted and ran test claims for the following: Brilinta.  Copays and coinsurance results were relayed to Inpatient clinical team.

## 2022-08-10 NOTE — TOC Benefit Eligibility Note (Signed)
Patient Teacher, English as a foreign language completed.    The patient is currently admitted and upon discharge could be taking Brilinta 90 mg.  The current 30 day co-pay is $25.00.   The patient is insured through [place], [place] Patient Advocate Specialist [place] Patient Advocate Team Direct Number: [phone]  Fax: [phone]

## 2022-08-10 NOTE — Progress Notes (Addendum)
Progress Note  Patient Name: Katherine Short Date of Encounter: 08/10/2022  [facility] Cardiologist: None   Subjective   Doing well. Nausea has resolved. No CP or dyspnea. Walked with cardiac rehab this morning and did well.   Inpatient Medications    Scheduled Meds:  aspirin EC  81 mg Oral Daily   buPROPion  150 mg Oral BID   Chlorhexidine Gluconate Cloth  6 each Topical Daily   gabapentin  300 mg Oral BID   heparin  5,000 Units Subcutaneous Q8H   metoprolol tartrate  12.5 mg Oral BID   rosuvastatin  40 mg Oral Daily   sodium chloride flush  3 mL Intravenous Q12H   ticagrelor  90 mg Oral BID   Vilazodone HCl  40 mg Oral QHS   Continuous Infusions:  sodium chloride Stopped (08/09/22 0213)   sodium chloride     promethazine (PHENERGAN) injection (IM or IVPB) Stopped (08/09/22 1058)   PRN Meds: sodium chloride, acetaminophen, nitroGLYCERIN, ondansetron (ZOFRAN) IV, mouth rinse, promethazine (PHENERGAN) injection (IM or IVPB), sodium chloride flush   Vital Signs    Vitals:   08/10/22 0300 08/10/22 0400 08/10/22 0500 08/10/22 0600  BP: 97/68 99/66 108/71 126/70  Pulse: 66 60 65 66  Resp:      Temp:      TempSrc:  Oral    SpO2: 94% 93% 93% 91%  Weight:      Height:        Intake/Output Summary (Last 24 hours) at 08/10/2022 0804 Last data filed at 08/09/2022 1930 Gross per 24 hour  Intake 50 ml  Output 1100 ml  Net -1050 ml      08/09/2022    1:10 AM 08/08/2022   10:38 PM 07/04/2022    6:57 PM  Last 3 Weights  Weight (lbs) 182 lb 15.7 oz 181 lb 181 lb  Weight (kg) 83 kg 82.1 kg 82.101 kg      Telemetry    Sinus rhythm with occasional PVC - Personally Reviewed  ECG    NSR with age-indeterminate inferior MI - Personally Reviewed  Physical Exam  Alert, oriented, in NAD GEN: No acute distress.   Neck: No JVD Cardiac: RRR, no murmurs, rubs, or gallops.  Respiratory: Clear to auscultation bilaterally. GI: Soft, nontender, non-distended  MS: No  edema; No deformity. Right radial site clear Neuro:  Nonfocal  Psych: Normal affect   Labs    High Sensitivity Troponin:   Recent Labs  Lab 08/08/22 2238 08/09/22 0206 08/09/22 0629  TROPONINIHS 79* 364* 1,227*     Chemistry Recent Labs  Lab 08/08/22 2238 08/09/22 0206  NA 139 138  K 3.7 4.1  CL 108 108  CO2 22 23  GLUCOSE 112* 170*  BUN 17 14  CREATININE 0.96 0.87  CALCIUM 9.1 8.5*  PROT 7.5  --   ALBUMIN 4.0  --   AST 24  --   ALT 22  --   ALKPHOS 69  --   BILITOT 0.5  --   GFRNONAA >60 >60  ANIONGAP 9 7    Lipids  Recent Labs  Lab 08/08/22 2238  CHOL 133  TRIG 188*  HDL 39*  LDLCALC 56  CHOLHDL 3.4    Hematology Recent Labs  Lab 08/08/22 2238 08/09/22 0206  WBC 12.2* 10.8*  RBC 4.68 4.08  HGB 14.3 12.4  HCT 42.9 37.8  MCV 91.7 92.6  MCH 30.6 30.4  MCHC 33.3 32.8  RDW 13.5 13.2  PLT 305  259   Thyroid No results for input(s): "TSH", "FREET4" in the last 168 hours.  BNPNo results for input(s): "BNP", "PROBNP" in the last 168 hours.  DDimer No results for input(s): "DDIMER" in the last 168 hours.   Radiology    ECHOCARDIOGRAM COMPLETE  Result Date: 08/09/2022    ECHOCARDIOGRAM REPORT   Patient Name:   Katherine Short Date of Exam: 08/09/2022 Medical Rec #:  [id number]       Height:       64.0 in Accession #:    [id number]      Weight:       183.0 lb Date of Birth:  [birth year]       BSA:          1.884 m Patient Age:    71 years        BP:           125/73 mmHg Patient Gender: F               HR:           71 bpm. Exam Location:  Inpatient Procedure: 2D Echo, Color Doppler and Cardiac Doppler Indications:    Acute MI i21.9  History:        Patient has prior history of Echocardiogram examinations, most                 recent 03/19/2010. Risk Factors:Hypertension and Dyslipidemia.  Sonographer:    Raquel Sarna Senior RDCS Referring Phys: [id number] PETER M Martinique IMPRESSIONS  1. Left ventricular ejection fraction, by estimation, is 60 to 65%. The left ventricle has  normal function. The left ventricle has no regional wall motion abnormalities. Left ventricular diastolic parameters are consistent with Grade I diastolic dysfunction (impaired relaxation).  2. Right ventricular systolic function is mildly reduced. The right ventricular size is normal. Tricuspid regurgitation signal is inadequate for assessing PA pressure.  3. Left atrial size was mildly dilated.  4. The mitral valve is abnormal. Mild mitral valve regurgitation.  5. The aortic valve is tricuspid. Aortic valve regurgitation is not visualized.  6. The inferior vena cava is normal in size with greater than 50% respiratory variability, suggesting right atrial pressure of 3 mmHg. FINDINGS  Left Ventricle: Left ventricular ejection fraction, by estimation, is 60 to 65%. The left ventricle has normal function. The left ventricle has no regional wall motion abnormalities. The left ventricular internal cavity size was normal in size. There is  no left ventricular hypertrophy. Left ventricular diastolic parameters are consistent with Grade I diastolic dysfunction (impaired relaxation). Indeterminate filling pressures. Right Ventricle: The right ventricular size is normal. No increase in right ventricular wall thickness. Right ventricular systolic function is mildly reduced. Tricuspid regurgitation signal is inadequate for assessing PA pressure. Left Atrium: Left atrial size was mildly dilated. Right Atrium: Right atrial size was normal in size. Pericardium: There is no evidence of pericardial effusion. Mitral Valve: The mitral valve is abnormal. There is mild thickening of the anterior and posterior mitral valve leaflet(s). Mild mitral valve regurgitation. Tricuspid Valve: The tricuspid valve is grossly normal. Tricuspid valve regurgitation is trivial. Aortic Valve: The aortic valve is tricuspid. Aortic valve regurgitation is not visualized. Pulmonic Valve: The pulmonic valve was normal in structure. Pulmonic valve  regurgitation is not visualized. Aorta: The aortic root and ascending aorta are structurally normal, with no evidence of dilitation. Venous: The inferior vena cava is normal in size with greater than 50% respiratory variability, suggesting right atrial pressure of  3 mmHg. IAS/Shunts: No atrial level shunt detected by color flow Doppler.  LEFT VENTRICLE PLAX 2D LVIDd:         4.00 cm   Diastology LVIDs:         3.30 cm   LV e' medial:    5.22 cm/s LV PW:         1.00 cm   LV E/e' medial:  15.6 LV IVS:        1.00 cm   LV e' lateral:   8.05 cm/s LVOT diam:     2.10 cm   LV E/e' lateral: 10.1 LV SV:         65 LV SV Index:   35 LVOT Area:     3.46 cm  RIGHT VENTRICLE RV S prime:     9.79 cm/s TAPSE (M-mode): 2.0 cm LEFT ATRIUM             Index        RIGHT ATRIUM           Index LA diam:        3.80 cm 2.02 cm/m   RA Area:     11.00 cm LA Vol (A2C):   35.7 ml 18.95 ml/m  RA Volume:   20.20 ml  10.72 ml/m LA Vol (A4C):   66.0 ml 35.04 ml/m LA Biplane Vol: 51.5 ml 27.34 ml/m  AORTIC VALVE LVOT Vmax:   86.20 cm/s LVOT Vmean:  60.600 cm/s LVOT VTI:    0.189 m  AORTA Ao Root diam: 3.00 cm Ao Asc diam:  3.60 cm MITRAL VALVE MV Area (PHT): 3.95 cm    SHUNTS MV Decel Time: 192 msec    Systemic VTI:  0.19 m MV E velocity: 81.40 cm/s  Systemic Diam: 2.10 cm MV A velocity: 95.50 cm/s MV E/A ratio:  0.85 Lyman Bishop MD Electronically signed by Lyman Bishop MD Signature Date/Time: 08/09/2022/1:30:57 PM    Final    CARDIAC CATHETERIZATION  Result Date: 08/09/2022   RPAV lesion is 100% stenosed.   Mid RCA lesion is 30% stenosed.   Mid LAD lesion is 60% stenosed.   2nd Diag lesion is 70% stenosed.   1st Diag lesion is 50% stenosed.   1st Mrg lesion is 50% stenosed.   A drug-eluting stent was successfully placed using a SYNERGY XD 2.50X20.   Post intervention, there is a 0% residual stenosis.   The left ventricular systolic function is normal.   LV end diastolic pressure is mildly elevated.   The left ventricular  ejection fraction is 55-65% by visual estimate. 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 Good LV function with inferobasal HK Mildly elevated LVEDP 20 mm Hg Successful PCI of the PL branch of the RCA with DES x 1. Plan: DAPT with ASA and Brilinta for one year. May be a candidate for fast track discharge depending on clinical course.   DG Chest Port 1 View  Result Date: 08/08/2022 CLINICAL DATA:  Chest pain. EXAM: PORTABLE CHEST 1 VIEW COMPARISON:  None Available. FINDINGS: The heart size and mediastinal contours are within normal limits. Mild atelectasis is seen within the left lung base. There is a small left pleural effusion. No pneumothorax is identified. The visualized skeletal structures are unremarkable. IMPRESSION: 1. Mild left basilar atelectasis. 2. Small left pleural effusion. Electronically Signed   By: Virgina Norfolk M.D.   On: 08/08/2022 22:58    Cardiac  Studies   See above  Patient Profile     71 y.o. female with HTN and hyperlipidemia presents 8/14 with acute inferior STEMI, treated with Primary PCI of the RCA  Assessment & Plan    STEMI involving the RCA: treated with primary PCI using a DES in a large PLA branch. Continue DAPT with ASA/Ticagrelor, high-intensity statin, beta-blocker. Echo reviewed as above. LVEF 60-65%, RV function mildly reduced.  HTN: pt normotensive on low-dose metoprolol. Holding home benicar. Continue same.  Hyperlipidemia: switched from simvastatin to rosuvastatin after STEMI.   Dispo: medically stable for DC today. Reviewed medications with the patient, her husband, and her son - ASA/brilinta x 12 months without interruption, change of simvastatin--->rosuvastatin, holding olmesartan, and starting metoprolol 12.5 mg BID. Reviewed post-MI restrictions with her, discussed Phase 2 cardiac rehab. Should follow-up in [place]. Would consider stress Myoview 3-6 months to assess residual  LAD stenosis and evaluate for anterior ischemia.   For questions or updates, please contact [place] Please consult [email or web address] for contact info under        Signed, Sherren Mocha, MD  08/10/2022, 8:04 AM

## 2022-08-10 NOTE — Research (Signed)
Spoke with patient about research study evolve (repatha vs soc)   She will think about it   I left the consent with her to review.   Will touch base with her this evening or this week.   Philemon Kingdom :) RN BSN  Clinical Research Nurse  Be strong and take heart, all you who hope in the [place]. ~ Psalm 31:24

## 2022-08-10 NOTE — Progress Notes (Addendum)
CARDIAC REHAB PHASE I   PRE:  Rate/Rhythm: 70 SR  BP:  Sitting: 109/73      SaO2: 96 RA  MODE:  Ambulation: 190 ft   POST:  Rate/Rhythm: 82 SR  BP:  Sitting: 118/82      SaO2: 96 RA  Pt ambulated well with no SOB only mild chest soreness. Pt states chest feels little achy at times but no chest pain like before. Back to room to chair with call bell in reach. Post stent education including risk factors, exercise guidelines, heart healthy diet, site care, restrictions, MI booklet,antiplatelet therapy importance, and CRP2. All questions and concerns addressed. Will refer to [facility] for CRP2. Plan for discharge today.   [id number]  Vanessa Barbara, RN BSN 08/10/2022 9:02 AM

## 2022-08-10 NOTE — Discharge Summary (Signed)
Discharge Summary    Patient ID: [name] MRN: [id number]; DOB: June 15, 1951  Admit date: 08/08/2022 Discharge date: 08/10/2022  PCP:  Celene Squibb, MD   [facility] Providers Cardiologist:  Peter Martinique, MD   (long term follow up in [place])  Discharge Diagnoses    Principal Problem:   STEMI involving right coronary artery [facility]) Active Problems:   Dyslipidemia   HTN (hypertension)   ST elevation myocardial infarction (STEMI) [facility])   ST elevation myocardial infarction involving right coronary artery [facility])   Diagnostic Studies/Procedures    Cath: 08/08/22    RPAV lesion is 100% stenosed.   Mid RCA lesion is 30% stenosed.   Mid LAD lesion is 60% stenosed.   2nd Diag lesion is 70% stenosed.   1st Diag lesion is 50% stenosed.   1st Mrg lesion is 50% stenosed.   A drug-eluting stent was successfully placed using a SYNERGY XD 2.50X20.   Post intervention, there is a 0% residual stenosis.   The left ventricular systolic function is normal.   LV end diastolic pressure is mildly elevated.   The left ventricular ejection fraction is 55-65% by visual estimate.   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  Good LV function with inferobasal HK Mildly elevated LVEDP 20 mm Hg Successful PCI of the PL branch of the RCA with DES x 1.    Plan: DAPT with ASA and Brilinta for one year. May be a candidate for fast track discharge depending on clinical course.   Diagnostic Dominance: Right  Intervention    Echo: 08/09/2022  IMPRESSIONS     1. Left ventricular ejection fraction, by estimation, is 60 to 65%. The  left ventricle has normal function. The left ventricle has no regional  wall motion abnormalities. Left ventricular diastolic parameters are  consistent with Grade I diastolic  dysfunction (impaired relaxation).   2. Right ventricular systolic function is mildly reduced. The right   ventricular size is normal. Tricuspid regurgitation signal is inadequate  for assessing PA pressure.   3. Left atrial size was mildly dilated.   4. The mitral valve is abnormal. Mild mitral valve regurgitation.   5. The aortic valve is tricuspid. Aortic valve regurgitation is not  visualized.   6. The inferior vena cava is normal in size with greater than 50%  respiratory variability, suggesting right atrial pressure of 3 mmHg.   FINDINGS   Left Ventricle: Left ventricular ejection fraction, by estimation, is 60  to 65%. The left ventricle has normal function. The left ventricle has no  regional wall motion abnormalities. The left ventricular internal cavity  size was normal in size. There is   no left ventricular hypertrophy. Left ventricular diastolic parameters  are consistent with Grade I diastolic dysfunction (impaired relaxation).  Indeterminate filling pressures.   Right Ventricle: The right ventricular size is normal. No increase in  right ventricular wall thickness. Right ventricular systolic function is  mildly reduced. Tricuspid regurgitation signal is inadequate for assessing  PA pressure.   Left Atrium: Left atrial size was mildly dilated.   Right Atrium: Right atrial size was normal in size.   Pericardium: There is no evidence of pericardial effusion.   Mitral Valve: The mitral valve is abnormal. There is mild thickening of  the anterior and posterior mitral valve leaflet(s). Mild mitral valve  regurgitation.   Tricuspid Valve: The tricuspid valve is grossly normal. Tricuspid valve  regurgitation  is trivial.   Aortic Valve: The aortic valve is tricuspid. Aortic valve regurgitation is  not visualized.   Pulmonic Valve: The pulmonic valve was normal in structure. Pulmonic valve  regurgitation is not visualized.   Aorta: The aortic root and ascending aorta are structurally normal, with  no evidence of dilitation.   Venous: The inferior vena cava is normal in  size with greater than 50%  respiratory variability, suggesting right atrial pressure of 3 mmHg.   IAS/Shunts: No atrial level shunt detected by color flow Doppler.  _____________   History of Present Illness     Katherine Short is a 71 y.o. female with PMH of HTN and HLD who presented with inferior STEMI.  No prior cardiac history.  Had a negative Myoview and echocardiogram in 2011.  The afternoon of admission developed acute onset of chest pain.  Initially waxed and waned and then intensified.  Presented to the ED at [facility] [place] and EKG showed inferior ST elevation with reciprocal changes in anterior leads.  She was given aspirin, IV heparin, nitro and morphine.  Transferred to [facility] emergently for cardiac catheterization.  Hospital Course     STEMI: Underwent cardiac catheterization which showed acute occlusion of large PL branch of RCA, successful PCI/DES x1.  Diffuse moderate disease and small caliber LAD and diagonal vessels.  Plan for DAPT with aspirin/Brilinta for at least 1 year.  High-sensitivity troponin peaked at 1227.  Follow-up echocardiogram showed LVEF of 60 to 32%, grade 1 diastolic dysfunction, mildly reduced RV function with normal size, mildly dilated left atrium, mild MR. No recurrent chest pain.  Recommendations to consider outpatient Myoview in 3 to 6 months to reassess residual LAD stenosis and evaluate for anterior wall ischemia.  -- Continue aspirin, Brilinta, low-dose metoprolol and Crestor  Hypertension: Blood pressures stable but soft at times.  Tolerated the addition of low-dose metoprolol, holding home Benicar at discharge  Hyperlipidemia: LDL 56, HDL 39, triglycerides 188 -- Transitioned from simvastatin to Crestor 40 mg daily -- LFTs/FLP in 8 weeks  Patient seen by Dr. Burt Knack and deemed stable for discharge home.  Medication sent to [place].  Follow-up arranged in the office.  Educated by [employer].D. prior to discharge.  Did the patient have an acute  coronary syndrome (MI, NSTEMI, STEMI, etc) this admission?:  Yes                               AHA/ACC Clinical Performance & Quality Measures: Aspirin prescribed? - Yes ADP Receptor Inhibitor (Plavix/Clopidogrel, Brilinta/Ticagrelor or Effient/Prasugrel) prescribed (includes medically managed patients)? - Yes Beta Blocker prescribed? - Yes High Intensity Statin (Lipitor 40-'80mg'$  or Crestor 20-'40mg'$ ) prescribed? - Yes EF assessed during THIS hospitalization? - Yes For EF <40%, was ACEI/ARB prescribed? - Not Applicable (EF >/= 35%) For EF <40%, Aldosterone Antagonist (Spironolactone or Eplerenone) prescribed? - Not Applicable (EF >/= 57%) Cardiac Rehab Phase II ordered (including medically managed patients)? - Yes       The patient will be scheduled for a TOC follow up appointment in 10-14 days.  A message has been sent to the [facility] and Scheduling Pool at the office where the patient should be seen for follow up.  _____________  Discharge Vitals Blood pressure 120/77, pulse 68, temperature 98.4 F (36.9 C), temperature source Oral, resp. rate 16, height '5\' 4"'$  (1.626 m), weight 83 kg, SpO2 93 %.  Filed Weights   08/08/22 2238 08/09/22  0110  Weight: 82.1 kg 83 kg    Labs & Radiologic Studies    CBC Recent Labs    08/08/22 2238 08/09/22 0206  WBC 12.2* 10.8*  NEUTROABS 3.8  --   HGB 14.3 12.4  HCT 42.9 37.8  MCV 91.7 92.6  PLT 305 938   Basic Metabolic Panel Recent Labs    08/08/22 2238 08/09/22 0206  NA 139 138  K 3.7 4.1  CL 108 108  CO2 22 23  GLUCOSE 112* 170*  BUN 17 14  CREATININE 0.96 0.87  CALCIUM 9.1 8.5*   Liver Function Tests Recent Labs    08/08/22 2238  AST 24  ALT 22  ALKPHOS 69  BILITOT 0.5  PROT 7.5  ALBUMIN 4.0   No results for input(s): "LIPASE", "AMYLASE" in the last 72 hours. High Sensitivity Troponin:   Recent Labs  Lab 08/08/22 2238 08/09/22 0206 08/09/22 0629  TROPONINIHS 79* 364* 1,227*    BNP Invalid input(s):  "POCBNP" D-Dimer No results for input(s): "DDIMER" in the last 72 hours. Hemoglobin A1C Recent Labs    08/08/22 2238  HGBA1C 5.7*   Fasting Lipid Panel Recent Labs    08/08/22 2238  CHOL 133  HDL 39*  LDLCALC 56  TRIG 188*  CHOLHDL 3.4   Thyroid Function Tests No results for input(s): "TSH", "T4TOTAL", "T3FREE", "THYROIDAB" in the last 72 hours.  Invalid input(s): "FREET3" _____________  ECHOCARDIOGRAM COMPLETE  Result Date: 08/09/2022    ECHOCARDIOGRAM REPORT   Patient Name:   DAISY LITES Date of Exam: 08/09/2022 Medical Rec #:  [id number]       Height:       64.0 in Accession #:    [id number]      Weight:       183.0 lb Date of Birth:  1951-09-29       BSA:          1.884 m Patient Age:    71 years        BP:           125/73 mmHg Patient Gender: F               HR:           71 bpm. Exam Location:  Inpatient Procedure: 2D Echo, Color Doppler and Cardiac Doppler Indications:    Acute MI i21.9  History:        Patient has prior history of Echocardiogram examinations, most                 recent 03/19/2010. Risk Factors:Hypertension and Dyslipidemia.  Sonographer:    Raquel Sarna Senior RDCS Referring Phys: [id number] PETER M Martinique IMPRESSIONS  1. Left ventricular ejection fraction, by estimation, is 60 to 65%. The left ventricle has normal function. The left ventricle has no regional wall motion abnormalities. Left ventricular diastolic parameters are consistent with Grade I diastolic dysfunction (impaired relaxation).  2. Right ventricular systolic function is mildly reduced. The right ventricular size is normal. Tricuspid regurgitation signal is inadequate for assessing PA pressure.  3. Left atrial size was mildly dilated.  4. The mitral valve is abnormal. Mild mitral valve regurgitation.  5. The aortic valve is tricuspid. Aortic valve regurgitation is not visualized.  6. The inferior vena cava is normal in size with greater than 50% respiratory variability, suggesting right atrial pressure of 3  mmHg. FINDINGS  Left Ventricle: Left ventricular ejection fraction, by estimation, is 60 to 65%. The left ventricle has normal  function. The left ventricle has no regional wall motion abnormalities. The left ventricular internal cavity size was normal in size. There is  no left ventricular hypertrophy. Left ventricular diastolic parameters are consistent with Grade I diastolic dysfunction (impaired relaxation). Indeterminate filling pressures. Right Ventricle: The right ventricular size is normal. No increase in right ventricular wall thickness. Right ventricular systolic function is mildly reduced. Tricuspid regurgitation signal is inadequate for assessing PA pressure. Left Atrium: Left atrial size was mildly dilated. Right Atrium: Right atrial size was normal in size. Pericardium: There is no evidence of pericardial effusion. Mitral Valve: The mitral valve is abnormal. There is mild thickening of the anterior and posterior mitral valve leaflet(s). Mild mitral valve regurgitation. Tricuspid Valve: The tricuspid valve is grossly normal. Tricuspid valve regurgitation is trivial. Aortic Valve: The aortic valve is tricuspid. Aortic valve regurgitation is not visualized. Pulmonic Valve: The pulmonic valve was normal in structure. Pulmonic valve regurgitation is not visualized. Aorta: The aortic root and ascending aorta are structurally normal, with no evidence of dilitation. Venous: The inferior vena cava is normal in size with greater than 50% respiratory variability, suggesting right atrial pressure of 3 mmHg. IAS/Shunts: No atrial level shunt detected by color flow Doppler.  LEFT VENTRICLE PLAX 2D LVIDd:         4.00 cm   Diastology LVIDs:         3.30 cm   LV e' medial:    5.22 cm/s LV PW:         1.00 cm   LV E/e' medial:  15.6 LV IVS:        1.00 cm   LV e' lateral:   8.05 cm/s LVOT diam:     2.10 cm   LV E/e' lateral: 10.1 LV SV:         65 LV SV Index:   35 LVOT Area:     3.46 cm  RIGHT VENTRICLE RV S prime:      9.79 cm/s TAPSE (M-mode): 2.0 cm LEFT ATRIUM             Index        RIGHT ATRIUM           Index LA diam:        3.80 cm 2.02 cm/m   RA Area:     11.00 cm LA Vol (A2C):   35.7 ml 18.95 ml/m  RA Volume:   20.20 ml  10.72 ml/m LA Vol (A4C):   66.0 ml 35.04 ml/m LA Biplane Vol: 51.5 ml 27.34 ml/m  AORTIC VALVE LVOT Vmax:   86.20 cm/s LVOT Vmean:  60.600 cm/s LVOT VTI:    0.189 m  AORTA Ao Root diam: 3.00 cm Ao Asc diam:  3.60 cm MITRAL VALVE MV Area (PHT): 3.95 cm    SHUNTS MV Decel Time: 192 msec    Systemic VTI:  0.19 m MV E velocity: 81.40 cm/s  Systemic Diam: 2.10 cm MV A velocity: 95.50 cm/s MV E/A ratio:  0.85 Lyman Bishop MD Electronically signed by Lyman Bishop MD Signature Date/Time: 08/09/2022/1:30:57 PM    Final    CARDIAC CATHETERIZATION  Result Date: 08/09/2022   RPAV lesion is 100% stenosed.   Mid RCA lesion is 30% stenosed.   Mid LAD lesion is 60% stenosed.   2nd Diag lesion is 70% stenosed.   1st Diag lesion is 50% stenosed.   1st Mrg lesion is 50% stenosed.   A drug-eluting stent was successfully placed using a SYNERGY  XD 2.50X20.   Post intervention, there is a 0% residual stenosis.   The left ventricular systolic function is normal.   LV end diastolic pressure is mildly elevated.   The left ventricular ejection fraction is 55-65% by visual estimate. 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 Good LV function with inferobasal HK Mildly elevated LVEDP 20 mm Hg Successful PCI of the PL branch of the RCA with DES x 1. Plan: DAPT with ASA and Brilinta for one year. May be a candidate for fast track discharge depending on clinical course.   DG Chest Port 1 View  Result Date: 08/08/2022 CLINICAL DATA:  Chest pain. EXAM: PORTABLE CHEST 1 VIEW COMPARISON:  None Available. FINDINGS: The heart size and mediastinal contours are within normal limits. Mild atelectasis is seen within the left lung base. There is a small left  pleural effusion. No pneumothorax is identified. The visualized skeletal structures are unremarkable. IMPRESSION: 1. Mild left basilar atelectasis. 2. Small left pleural effusion. Electronically Signed   By: Virgina Norfolk M.D.   On: 08/08/2022 22:58    Disposition   Pt is being discharged home today in good condition.  Follow-up Plans & Appointments     Follow-up Information     Lenna Sciara, NP Follow up on 08/20/2022.   Specialties: Nurse Practitioner, Family Medicine Why: at 2:20pm for your follow up appt with Dr. Morrison Old' NP Raquel Sarna. Contact information: [address] [place] [address] [phone]                Discharge Instructions     Amb Referral to Cardiac Rehabilitation   Complete by: As directed    Diagnosis:  STEMI Coronary Stents     After initial evaluation and assessments completed: Virtual Based Care may be provided alone or in conjunction with Phase 2 Cardiac Rehab based on patient barriers.: Yes   Call MD for:  difficulty breathing, headache or visual disturbances   Complete by: As directed    Call MD for:  persistant dizziness or light-headedness   Complete by: As directed    Call MD for:  redness, tenderness, or signs of infection (pain, swelling, redness, odor or green/yellow discharge around incision site)   Complete by: As directed    Diet - low sodium heart healthy   Complete by: As directed    Discharge instructions   Complete by: As directed    Radial Site Care Refer to this sheet in the next few weeks. These instructions provide you with information on caring for yourself after your procedure. Your caregiver may also give you more specific instructions. Your treatment has been planned according to current medical practices, but problems sometimes occur. Call your caregiver if you have any problems or questions after your procedure. HOME CARE INSTRUCTIONS You may shower the day after the procedure. Remove the bandage  (dressing) and gently wash the site with plain soap and water. Gently pat the site dry.  Do not apply powder or lotion to the site.  Do not submerge the affected site in water for 3 to 5 days.  Inspect the site at least twice daily.  Do not flex or bend the affected arm for 24 hours.  No lifting over 5 pounds (2.3 kg) for 5 days after your procedure.  Do not drive home if you are discharged the same day of the procedure. Have someone else drive you.  You may  drive 24 hours after the procedure unless otherwise instructed by your caregiver.  What to expect: Any bruising will usually fade within 1 to 2 weeks.  Blood that collects in the tissue (hematoma) may be painful to the touch. It should usually decrease in size and tenderness within 1 to 2 weeks.  SEEK IMMEDIATE MEDICAL CARE IF: You have unusual pain at the radial site.  You have redness, warmth, swelling, or pain at the radial site.  You have drainage (other than a small amount of blood on the dressing).  You have chills.  You have a fever or persistent symptoms for more than 72 hours.  You have a fever and your symptoms suddenly get worse.  Your arm becomes pale, cool, tingly, or numb.  You have heavy bleeding from the site. Hold pressure on the site.   PLEASE DO NOT MISS ANY DOSES OF YOUR BRILINTA!!!!! Also keep a log of you blood pressures and bring back to your follow up appt. Please call the office with any questions.   Patients taking blood thinners should generally stay away from medicines like ibuprofen, Advil, Motrin, naproxen, and Aleve due to risk of stomach bleeding. You may take Tylenol as directed or talk to your primary doctor about alternatives.   PLEASE ENSURE THAT YOU DO NOT RUN OUT OF YOUR BRILINTA. This medication is very important to remain on for at least one year. IF you have issues obtaining this medication due to cost please CALL the office 3-5 business days prior to running out in order to prevent missing  doses of this medication.   Increase activity slowly   Complete by: As directed        Discharge Medications   Allergies as of 08/10/2022       Reactions   Penicillins Itching, Rash           Medication List     STOP taking these medications    atorvastatin 40 MG tablet Commonly known as: LIPITOR   cyclobenzaprine 5 MG tablet Commonly known as: FLEXERIL   HYDROcodone-acetaminophen 5-325 MG tablet Commonly known as: NORCO/VICODIN   olmesartan 40 MG tablet Commonly known as: Benicar   simvastatin 40 MG tablet Commonly known as: ZOCOR       TAKE these medications    acetaminophen 500 MG tablet Commonly known as: TYLENOL Take 500 mg by mouth every 6 (six) hours as needed for moderate pain.   Activella 0.5-0.1 MG tablet Generic drug: Estradiol-Norethindrone Acet Take 1 tablet by mouth at bedtime.   acyclovir 200 MG capsule Commonly known as: ZOVIRAX Take 200 mg by mouth 2 (two) times daily.   aspirin EC 81 MG tablet Take 1 tablet (81 mg total) by mouth daily. Swallow whole. Start taking on: August 11, 2022   desvenlafaxine 25 MG 24 hr tablet Commonly known as: PRISTIQ Take 25 mg by mouth daily.   diclofenac 75 MG EC tablet Commonly known as: VOLTAREN Take 75 mg by mouth daily as needed for mild pain.   gabapentin 300 MG capsule Commonly known as: NEURONTIN Take 300 mg by mouth 3 (three) times daily.   meclizine 25 MG tablet Commonly known as: ANTIVERT Take 1 tablet (25 mg total) by mouth 4 (four) times daily as needed for dizziness. What changed: when to take this   metoprolol tartrate 25 MG tablet Commonly known as: LOPRESSOR Take 0.5 tablets (12.5 mg total) by mouth 2 (two) times daily.   nitroGLYCERIN 0.4 MG SL tablet Commonly known as:  NITROSTAT Place 1 tablet (0.4 mg total) under the tongue every 5 (five) minutes as needed for chest pain.   Ozempic (1 MG/DOSE) 4 MG/3ML Sopn Generic drug: Semaglutide (1 MG/DOSE) Inject 1 mg into the  skin once a week.   pantoprazole 40 MG tablet Commonly known as: PROTONIX Take 40 mg by mouth daily.   rosuvastatin 40 MG tablet Commonly known as: CRESTOR Take 1 tablet (40 mg total) by mouth daily. Start taking on: August 11, 2022   temazepam 30 MG capsule Commonly known as: RESTORIL Take 30 mg by mouth at bedtime.   ticagrelor 90 MG Tabs tablet Commonly known as: BRILINTA Take 1 tablet (90 mg total) by mouth 2 (two) times daily.           Outstanding Labs/Studies   FLP/LFTs in 8 weeks  Duration of Discharge Encounter   Greater than 30 minutes including physician time.  Signed, Reino Bellis, NP 08/10/2022, 10:23 AM

## 2022-08-12 ENCOUNTER — Telehealth: Payer: Self-pay | Admitting: Physician Assistant

## 2022-08-12 ENCOUNTER — Other Ambulatory Visit: Payer: Self-pay

## 2022-08-12 ENCOUNTER — Telehealth: Payer: Self-pay | Admitting: Cardiology

## 2022-08-12 ENCOUNTER — Emergency Department (HOSPITAL_COMMUNITY): Payer: Medicare HMO

## 2022-08-12 ENCOUNTER — Encounter (HOSPITAL_COMMUNITY): Payer: Self-pay | Admitting: Emergency Medicine

## 2022-08-12 ENCOUNTER — Emergency Department (HOSPITAL_COMMUNITY)
Admission: EM | Admit: 2022-08-12 | Discharge: 2022-08-13 | Disposition: A | Discharge status: Home / Self Care | Payer: Medicare HMO | Attending: Emergency Medicine | Admitting: Emergency Medicine

## 2022-08-12 DIAGNOSIS — D72829 Elevated white blood cell count, unspecified: Secondary | ICD-10-CM | POA: Diagnosis not present

## 2022-08-12 DIAGNOSIS — R202 Paresthesia of skin: Secondary | ICD-10-CM | POA: Insufficient documentation

## 2022-08-12 DIAGNOSIS — Z79899 Other long term (current) drug therapy: Secondary | ICD-10-CM | POA: Insufficient documentation

## 2022-08-12 DIAGNOSIS — R7989 Other specified abnormal findings of blood chemistry: Secondary | ICD-10-CM | POA: Insufficient documentation

## 2022-08-12 DIAGNOSIS — Z7982 Long term (current) use of aspirin: Secondary | ICD-10-CM | POA: Insufficient documentation

## 2022-08-12 DIAGNOSIS — R209 Unspecified disturbances of skin sensation: Secondary | ICD-10-CM | POA: Insufficient documentation

## 2022-08-12 HISTORY — DX: ST elevation (STEMI) myocardial infarction of unspecified site: I21.3

## 2022-08-12 LAB — CBC
HCT: 43.8 % (ref 36.0–46.0)
Hemoglobin: 14.1 g/dL (ref 12.0–15.0)
MCH: 29.8 pg (ref 26.0–34.0)
MCHC: 32.2 g/dL (ref 30.0–36.0)
MCV: 92.6 fL (ref 80.0–100.0)
Platelets: 337 10*3/uL (ref 150–400)
RBC: 4.73 MIL/uL (ref 3.87–5.11)
RDW: 13.4 % (ref 11.5–15.5)
WBC: 11 10*3/uL — ABNORMAL HIGH (ref 4.0–10.5)
nRBC: 0 % (ref 0.0–0.2)

## 2022-08-12 LAB — TROPONIN I (HIGH SENSITIVITY): Troponin I (High Sensitivity): 251 ng/L (ref <18)

## 2022-08-12 LAB — BASIC METABOLIC PANEL
Anion gap: 9 (ref 5–15)
BUN: 12 mg/dL (ref 8–23)
CO2: 26 mmol/L (ref 22–32)
Calcium: 9.9 mg/dL (ref 8.9–10.3)
Chloride: 107 mmol/L (ref 98–111)
Creatinine, Ser: 1.15 mg/dL — ABNORMAL HIGH (ref 0.44–1.00)
GFR, Estimated: 51 mL/min — ABNORMAL LOW (ref >60)
Glucose, Bld: 114 mg/dL — ABNORMAL HIGH (ref 70–99)
Potassium: 5.1 mmol/L (ref 3.5–5.1)
Sodium: 142 mmol/L (ref 135–145)

## 2022-08-12 LAB — PROTIME-INR
INR: 1 (ref 0.8–1.2)
Prothrombin Time: 13.1 seconds (ref 11.4–15.2)

## 2022-08-12 NOTE — Telephone Encounter (Signed)
Pt called stating she was discharged yesterday following PCI. She took a nap today and when she woke up she felt numb on her left arm, left leg, and left hip. She describes numbness and tingly and feeling fatigue. She can move all extremities. She can walk, but feels like her hip and leg are numb. She is concerned about a stroke. I advised calling EMS immediately. She and her daughter express understanding of the plan.

## 2022-08-12 NOTE — Telephone Encounter (Signed)
Patient reports soreness at rt wrist with minimal bruising. Denies numbness tingling in rt hand. Reports numbness in left shoulder to elbow and in left hip. Dr. Martinique (DOD) advised. Recommended patient to her appointment with E, Monge, NP and to advise clinic if symptoms to no resolve or get worse. Patient informed and verbalized understanding.

## 2022-08-12 NOTE — ED Triage Notes (Addendum)
Patient arrived with EMS from home reports left arm tingling /left hip and left leg tingling with " sweats" onset 2 pm ths afternoon , no chest pain or SOB . Admitted last 08/08/22 for STEMI / underwent cardiac catheterization ( coronary stent) and discharge home yesterday . EKG normal by EMS .

## 2022-08-12 NOTE — Telephone Encounter (Signed)
Patient states she is having numbness in her left arm starting the back of her shoulder down to her elbow. She says it is also in her left hip. She says she thought it was the way she laid on it but is not sure.

## 2022-08-12 NOTE — ED Provider Triage Note (Signed)
Emergency Medicine Provider Triage Evaluation Note  Katherine Short , a 71 y.o. female  was evaluated in triage.  Pt complains of numbness and tingling down left arm now down the left side of her back and to her bottom since earlier today.  Patient recently had a heart catheterization and MI with stent placement and spent a few days at CVICU.  Discharged yesterday.  Denies any headache or any chest pain or shortness of breath..  Review of Systems  Positive:  Negative:   Physical Exam  BP 130/82   Pulse 71   Temp 98 F (36.7 C) (Oral)   Resp 16   SpO2 97%  Gen:   Awake, no distress   Resp:  Normal effort  MSK:   Moves extremities without difficulty  Other:  Cranial nerves II through XII intact grossly.  No pronator drift.  Sensation intact in patient's upper arms and face.  Answering questions appropriately with appropriate speech.  Medical Decision Making  Medically screening exam initiated at 9:11 PM.  Appropriate orders placed.  Thomasenia Sales was informed that the remainder of the evaluation will be completed by another provider, this initial triage assessment does not replace that evaluation, and the importance of remaining in the ED until their evaluation is complete.  Discussed with my attending.  We will stick with labs and defer any imaging to patient's seen in the back   Sherrell Puller, [place] 08/12/22 2113

## 2022-08-13 ENCOUNTER — Emergency Department (HOSPITAL_COMMUNITY): Payer: Medicare HMO

## 2022-08-13 DIAGNOSIS — R202 Paresthesia of skin: Secondary | ICD-10-CM | POA: Diagnosis not present

## 2022-08-13 DIAGNOSIS — R209 Unspecified disturbances of skin sensation: Secondary | ICD-10-CM | POA: Diagnosis not present

## 2022-08-13 LAB — TROPONIN I (HIGH SENSITIVITY): Troponin I (High Sensitivity): 261 ng/L (ref <18)

## 2022-08-13 MED ORDER — LORAZEPAM 1 MG PO TABS
0.5000 mg | ORAL_TABLET | Freq: Once | ORAL | Status: AC
  Administered 2022-08-13: 0.5 mg via ORAL
  Filled 2022-08-13: qty 1

## 2022-08-13 MED ORDER — ACETAMINOPHEN 325 MG PO TABS
650.0000 mg | ORAL_TABLET | Freq: Once | ORAL | Status: AC
  Administered 2022-08-13: 650 mg via ORAL
  Filled 2022-08-13: qty 2

## 2022-08-13 NOTE — Consult Note (Signed)
Neurology Consultation Reason for Consult: Left upper deltoid and lateral thigh numbness Requesting Physician: Ripley Fraise  CC: Left upper extremity and left lateral thigh numbness  History is obtained from: Patient and husband at bedside and chart review  HPI: Katherine Short is a 71 y.o. female with a past medical history of recent STEMI with cardiac catheterization s/p stent (8/14), hypertension, hyperlipidemia, prediabetes, obesity (BMI 31.41), depression  She had to lay down on her left side to take a nap and when she awoke she had left shoulder and left lateral thigh numbness.  This has happened not infrequently in the past but has never persisted this long and given her recent cardiac history she was concerned about stroke and came to the ED for evaluation   LKW: 2 PM on 8/17 tPA given?: No, out of window IA performed?: No, exam not consistent with LVO Premorbid modified rankin scale: 0-1     1 - No significant disability. Able to carry out all usual activities, despite some symptoms.   ROS: All other review of systems was negative except as noted in the HPI.  Past Medical History:  Diagnosis Date   Anxiety    Chest pain 03/19/2010   nuclear study was negative   Depression    Dyslipidemia    GERD (gastroesophageal reflux disease)    Headache(784.0)    Hypertension    STEMI (ST elevation myocardial infarction) [facility])    Past Surgical History:  Procedure Laterality Date   BREAST LUMPECTOMY WITH RADIOACTIVE SEED LOCALIZATION Left 07/07/2017   Procedure: LEFT BREAST LUMPECTOMY WITH RADIOACTIVE SEED LOCALIZATION;  Surgeon: Jovita Kussmaul, MD;  Location: [facility];  Service: General;  Laterality: Left;   BREAST LUMPECTOMY WITH RADIOACTIVE SEED LOCALIZATION Left 07/18/2017   Procedure: LEFT BREAST LUMPECTOMY WITH RADIOACTIVE SEED LOCALIZATION;  Surgeon: Jovita Kussmaul, MD;  Location: [place];  Service: General;  Laterality: Left;   CATARACT EXTRACTION W/PHACO   11/22/2011   Procedure: CATARACT EXTRACTION PHACO AND INTRAOCULAR LENS PLACEMENT ([place]);  Surgeon: Tonny Branch;  Location: [facility];  Service: Ophthalmology;  Laterality: Left;  CDE:7.76   CATARACT EXTRACTION W/PHACO  12/02/2011   Procedure: CATARACT EXTRACTION PHACO AND INTRAOCULAR LENS PLACEMENT ([facility]);  Surgeon: Tonny Branch;  Location: [facility];  Service: Ophthalmology;  Laterality: Right;  CDE=11.92   COLONOSCOPY     CORONARY/GRAFT ACUTE MI REVASCULARIZATION N/A 08/08/2022   Procedure: Coronary/Graft Acute MI Revascularization;  Surgeon: Martinique, Peter M, MD;  Location: [place] [facility];  Service: Cardiovascular;  Laterality: N/A;   LEFT HEART CATH AND CORONARY ANGIOGRAPHY N/A 08/08/2022   Procedure: LEFT HEART CATH AND CORONARY ANGIOGRAPHY;  Surgeon: Martinique, Peter M, MD;  Location: [place] [facility];  Service: Cardiovascular;  Laterality: N/A;   TUBAL LIGATION       Family History  Problem Relation Age of Onset   Cancer Mother 48       breast   Diabetes Father    Social History:  reports that she has never smoked. She has never used smokeless tobacco. She reports that she does not drink alcohol and does not use drugs.   Exam: Current vital signs: BP 121/89   Pulse 71   Temp 98.5 F (36.9 C) (Oral)   Resp (!) 21   SpO2 96%  Vital signs in last 24 hours: Temp:  [98 F (36.7 C)-98.5 F (36.9 C)] 98.5 F (36.9 C) (08/18 0311) Pulse Rate:  [65-75] 71 (08/18 0330) Resp:  [12-21] 21 (08/18  0330) BP: (117-136)/(66-89) 121/89 (08/18 0330) SpO2:  [95 %-98 %] 96 % (08/18 0330)   Physical Exam  Constitutional: Appears well-developed and well-nourished.  Psych: Affect appropriate to situation, mildly anxious but cooperative Eyes: No scleral injection HENT: No oropharyngeal obstruction.  MSK: no joint deformities.  Cardiovascular: Normal rate and regular rhythm.  Perfusing extremities well Respiratory: Effort normal, non-labored breathing GI: Soft.  No distension. There is no  tenderness.  Skin: Warm dry and intact visible skin  Neuro: Mental Status: Patient is awake, alert, oriented to person, place, month, year, and situation. Patient is able to give a clear and coherent history. No signs of aphasia or neglect Cranial Nerves: II: Visual Fields are full. Pupils are equal, round, and reactive to light.   III,IV, VI: EOMI without ptosis or diploplia.  V: Facial sensation is symmetric to temperature VII: Facial movement is symmetric.  VIII: hearing is intact to voice X: Uvula elevates symmetrically XI: Shoulder shrug is symmetric. XII: tongue is midline without atrophy or fasciculations.  Motor: Tone is normal. Bulk is normal. 5/5 strength was present in all four extremities.  Sensory: Sensation is symmetric to light touch and temperature in the arms and legs. Deep Tendon Reflexes: 3+ and symmetric in the brachioradialis and patellae.  Cerebellar: FNF and HKS are intact bilaterally Gait:  Deferred in acute setting   NIHSS total 1 Score breakdown: Sensory changes left deltoid and left thigh Performed at 4 AM time of patient arrival to ED    I have reviewed labs in epic and the results pertinent to this consultation are:  Basic Metabolic Panel: Recent Labs  Lab 08/08/22 2238 08/09/22 0206 08/12/22 2055  NA 139 138 142  K 3.7 4.1 5.1  CL 108 108 107  CO2 '22 23 26  '$ GLUCOSE 112* 170* 114*  BUN '17 14 12  '$ CREATININE 0.96 0.87 1.15*  CALCIUM 9.1 8.5* 9.9    CBC: Recent Labs  Lab 08/08/22 2238 08/09/22 0206 08/12/22 2055  WBC 12.2* 10.8* 11.0*  NEUTROABS 3.8  --   --   HGB 14.3 12.4 14.1  HCT 42.9 37.8 43.8  MCV 91.7 92.6 92.6  PLT 305 259 337    Coagulation Studies: Recent Labs    08/12/22 2055  LABPROT 13.1  INR 1.0    Lab Results  Component Value Date   CHOL 133 08/08/2022   HDL 39 (L) 08/08/2022   LDLCALC 56 08/08/2022   TRIG 188 (H) 08/08/2022   CHOLHDL 3.4 08/08/2022   Lab Results  Component Value Date   HGBA1C  5.7 (H) 08/08/2022      I have reviewed the images obtained:  MRI brain personally reviewed, agree with radiology:   1. Positive for two tiny cortical infarcts in the left parietal lobe - right side sensory representation area. No associated hemorrhage or mass effect.   2. Otherwise advanced but nonspecific signal changes in the bilateral cerebral white matter, most commonly due to chronic small vessel disease. Possible chronic microhemorrhage in the right corona radiata, and tiny chronic infarct in the left cerebellum.   3. Cervical Spine MRI today reported separately.   ECHO 8/14  1. Left ventricular ejection fraction, by estimation, is 60 to 65%. The  left ventricle has normal function. The left ventricle has no regional  wall motion abnormalities. Left ventricular diastolic parameters are  consistent with Grade I diastolic  dysfunction (impaired relaxation).   2. Right ventricular systolic function is mildly reduced. The right  ventricular size is  normal. Tricuspid regurgitation signal is inadequate  for assessing PA pressure.   3. Left atrial size was mildly dilated.   4. The mitral valve is abnormal. Mild mitral valve regurgitation.   5. The aortic valve is tricuspid. Aortic valve regurgitation is not  visualized.   6. The inferior vena cava is normal in size with greater than 50%  respiratory variability, suggesting right atrial pressure of 3 mmHg.   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  The etiology of her strokes is normal certainly her recent cardiac catheterization however she does have a left atrial dilation which increases risk of occult atrial fibrillation.  Additionally she is on estrogen which increases her risk of stroke.  Recommend discontinuing estrogen if she is able to tolerate symptoms and  recommend following up with cardiology for [facility] patch.  The strokes on MRI do not explain her symptoms given they are on the incorrect side of the brain  Recommendations: #Small procedural related strokes asymptomatic - LDL and A1c meeting goals - Carotid ultrasound, MRA can be considered outpatient - Recent echocardiogram w/ left atrial enlargement therefore recommend a Zio patch - Continue DAPT - Risk factor modification, patient counseled to stop estrogen pills - Blood pressure goal normotension - Patient appropriate for discharge  Lesleigh Noe MD-PhD [facility] [phone] Available 7 PM to 7 AM, outside of these hours please call Neurologist on call as listed on Amion.

## 2022-08-13 NOTE — ED Notes (Signed)
Patient returned from MRI.

## 2022-08-13 NOTE — ED Notes (Addendum)
Patient taken to MRI

## 2022-08-13 NOTE — Discharge Instructions (Addendum)
Please stop your estrogen When you follow-up with your cardiologist, please asked them for you to wear a Zio patch to determine any heart rhythm problems

## 2022-08-13 NOTE — ED Provider Notes (Signed)
[facility] EMERGENCY DEPARTMENT Provider Note   CSN: [id number] Arrival date & time: 08/12/22  1926     History  Chief Complaint  Patient presents with   Arm Tingling/Sweats    Katherine Short is a 71 y.o. female.  The history is provided by the patient and a relative.   Patient presents with left arm and left leg numbness.  This started around 2 PM on August 17.  She reports she took a nap after it started but it never improved.  She denies any arm or leg weakness.  No significant headache or visual changes.  No dizziness.  She is ambulatory. No chest pain or shortness of breath Denies previous history of CVA.  No previous spinal surgery.  She reports mild neck pain, no back pain  Patient was just recently at hospital for an acute MI and is currently taking all medications.  The symptoms are not consistent with prior cardiac issues    Home Medications Prior to Admission medications   Medication Sig Start Date End Date Taking? Authorizing Provider  acetaminophen (TYLENOL) 500 MG tablet Take 500 mg by mouth every 6 (six) hours as needed for moderate pain.   Yes [provider]  acyclovir (ZOVIRAX) 200 MG capsule Take 200 mg by mouth 2 (two) times daily.    Yes [provider]  aspirin EC 81 MG tablet Take 1 tablet (81 mg total) by mouth daily. Swallow whole. 08/11/22  Yes Reino Bellis B, NP  desvenlafaxine (PRISTIQ) 25 MG 24 hr tablet Take 25 mg by mouth daily.   Yes [provider]  diclofenac (VOLTAREN) 75 MG EC tablet Take 75 mg by mouth daily as needed for mild pain.   Yes [provider]  gabapentin (NEURONTIN) 300 MG capsule Take 300 mg by mouth 3 (three) times daily. 06/05/17  Yes [provider]  meclizine (ANTIVERT) 25 MG tablet Take 1 tablet (25 mg total) by mouth 4 (four) times daily as needed for dizziness. Patient taking differently: Take 25 mg by mouth 3 (three) times daily as needed for dizziness.  08/31/14  Yes Rolland Porter, MD  metoprolol tartrate (LOPRESSOR) 25 MG tablet Take 1/2 tablet (12.5 mg total) by mouth 2 (two) times daily. 08/10/22  Yes Cheryln Manly, NP  nitroGLYCERIN (NITROSTAT) 0.4 MG SL tablet Place 1 tablet (0.4 mg total) under the tongue every 5 (five) minutes as needed for chest pain. 08/10/22  Yes Reino Bellis B, NP  OZEMPIC, 1 MG/DOSE, 4 MG/3ML SOPN Inject 1 mg into the skin once a week. 06/08/22  Yes [provider]  pantoprazole (PROTONIX) 40 MG tablet Take 40 mg by mouth daily.   Yes [provider]  rosuvastatin (CRESTOR) 40 MG tablet Take 1 tablet (40 mg total) by mouth daily. 08/11/22  Yes Reino Bellis B, NP  temazepam (RESTORIL) 30 MG capsule Take 30 mg by mouth at bedtime.     Yes [provider]  ticagrelor (BRILINTA) 90 MG TABS tablet Take 1 tablet (90 mg total) by mouth 2 (two) times daily. 08/10/22  Yes Cheryln Manly, NP      Allergies    Penicillins    Review of Systems   Review of Systems  Constitutional:  Negative for fever.  Eyes:  Negative for visual disturbance.  Respiratory:  Negative for shortness of breath.   Cardiovascular:  Negative for chest pain.  Gastrointestinal:  Negative for nausea.  Neurological:  Positive for numbness. Negative for speech  difficulty and weakness.    Physical Exam Updated Vital Signs BP 105/67   Pulse 72   Temp 98 F (36.7 C) (Oral)   Resp 14   SpO2 97%  Physical Exam CONSTITUTIONAL: Well developed/well nourished HEAD: Normocephalic/atraumatic EYES: EOMI/PERRL, no nystagmus, no visual field deficit  no ptosis ENMT: Mucous membranes moist NECK: supple no meningeal signs, no bruits Mild cervical paraspinal tenderness CV: S1/S2 noted, no murmurs/rubs/gallops noted LUNGS: Lungs are clear to auscultation bilaterally, no apparent distress ABDOMEN: soft, nontender, no rebound or guarding GU:no cva tenderness NEURO:Awake/alert, face symmetric, no arm or leg drift is  noted Equal 5/5 strength with shoulder abduction, elbow flex/extension, wrist flex/extension in upper extremities and equal hand grips bilaterally Equal 5/5 strength with hip flexion,knee flex/extension, foot dorsi/plantar flexion Cranial nerves 3/4/5/6/07/04/09/11/12 tested and intact Gait normal without ataxia No past pointing Patient reports mild numbness to the posterior aspect of her left humerus.  She reports numbness to her left thigh. Denies any other sensory deficit EXTREMITIES: pulses normalx4, full ROM No bruising or tenderness over her right arm from recent cath SKIN: warm, color normal PSYCH: no abnormalities of mood noted  ED Results / Procedures / Treatments   Labs (all labs ordered are listed, but only abnormal results are displayed) Labs Reviewed  BASIC METABOLIC PANEL - Abnormal; Notable for the following components:      Result Value   Glucose, Bld 114 (*)    Creatinine, Ser 1.15 (*)    GFR, Estimated 51 (*)    All other components within normal limits  CBC - Abnormal; Notable for the following components:   WBC 11.0 (*)    All other components within normal limits  TROPONIN I (HIGH SENSITIVITY) - Abnormal; Notable for the following components:   Troponin I (High Sensitivity) 251 (*)    All other components within normal limits  TROPONIN I (HIGH SENSITIVITY) - Abnormal; Notable for the following components:   Troponin I (High Sensitivity) 261 (*)    All other components within normal limits  PROTIME-INR    EKG EKG Interpretation  Date/Time:  Thursday August 12 2022 20:35:05 EDT Ventricular Rate:  77 PR Interval:  140 QRS Duration: 80 QT Interval:  390 QTC Calculation: 441 R Axis:   -54 Text Interpretation: Normal sinus rhythm Low voltage QRS Left anterior fascicular block Inferior infarct , age undetermined Anterolateral infarct , age undetermined Abnormal ECG Confirmed by Ripley Fraise [phone]) on 08/12/2022 11:43:44 PM  Radiology MR Cervical Spine  Wo Contrast  Result Date: 08/13/2022 CLINICAL DATA:  71 year old female with numbness and tingling radiating down the left arm, back and buttocks. Recent heart catheterization. EXAM: MRI CERVICAL SPINE WITHOUT CONTRAST TECHNIQUE: Multiplanar, multisequence MR imaging of the cervical spine was performed. No intravenous contrast was administered. COMPARISON:  Brain MRI today reported separately. FINDINGS: Alignment: Straightening and mild reversal of cervical lordosis, with associated mild degenerative appearing anterolisthesis of C3 on C4 and C4 on C5 (2-3 mm). Vertebrae: No marrow edema or evidence of acute osseous abnormality. But there is bulky bilateral mid cervical facet hypertrophy. Chronic degenerative marrow signal changes in the bilateral facets and also the C5-C6 endplates. Cord: No spinal cord signal abnormality despite some degenerative cervical cord mass effect detailed below. Negative visible upper thoracic cord. Posterior Fossa, vertebral arteries, paraspinal tissues: Ligamentous hypertrophy and vertebral artery tortuosity at the cervicomedullary junction. Brain is detailed separately today. Preserved major vascular flow voids in the neck. Negative visible neck soft tissues, lung apices. Disc levels: C2-C3: Small  central disc bulge or protrusion with mild facet hypertrophy. No stenosis. C3-C4: Mild anterolisthesis with moderate to severe facet hypertrophy greater on the right. Mild disc bulging, endplate spurring. Borderline spinal stenosis. Moderate to severe bilateral C4 foraminal stenosis. C4-C5: Mild anterolisthesis with disc space loss. Moderate to severe facet hypertrophy greater on the left. Circumferential disc osteophyte complex. Mild ligament flavum hypertrophy. Mild spinal stenosis and spinal cord mass effect. Moderate to severe bilateral C5 foraminal stenosis. C5-C6: Advanced disc space loss with bulky circumferential disc osteophyte complex. Mild spinal stenosis and effaced ventral CSF  space without spinal cord mass effect. Mild left but moderate to severe right C6 foraminal stenosis. C6-C7: Advanced disc space loss with bulky circumferential disc osteophyte complex. Mild facet and ligament flavum hypertrophy. No significant spinal stenosis. Mild C7 foraminal stenosis mainly on the right. C7-T1: Mild disc bulging. Mild to moderate facet and ligament flavum hypertrophy. No spinal stenosis. Mild right C8 foraminal stenosis. No upper thoracic spinal stenosis despite disc bulging. IMPRESSION: 1. 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 2. Multifactorial mild spinal stenosis and spinal cord mass effect at C4-C5, and to a lesser extent C5-C6. No spinal cord signal abnormality. 3. Moderate to severe degenerative neural foraminal stenosis at the bilateral C4, bilateral C5, and right C6 nerve levels. Electronically Signed   By: Genevie Ann M.D.   On: 08/13/2022 04:31   MR BRAIN WO CONTRAST  Result Date: 08/13/2022 CLINICAL DATA:  71 year old female with numbness and tingling radiating down the left arm, back and buttocks. Recent heart catheterization. EXAM: MRI HEAD WITHOUT CONTRAST TECHNIQUE: Multiplanar, multiecho pulse sequences of the brain and surrounding structures were obtained without intravenous contrast. COMPARISON:  Cervical spine MRI today reported separately. FINDINGS: Brain: Subtle postcentral cortical restricted diffusion series 5, image 92 and series 5, image 90. Minimal associated T2 and FLAIR hyperintensity. No hemorrhage or mass effect. No other restricted diffusion. No midline shift, evidence of mass lesion, ventriculomegaly, extra-axial collection or acute intracranial hemorrhage. Negative pituitary. Mild mass effect on the ventral cervicomedullary junction due to combined vertebral artery tortuosity and degenerative ligamentous hypertrophy about the odontoid. Widespread patchy and confluent bilateral cerebral white matter T2 and  FLAIR hyperintensity, facilitated diffusion. No chronic cortical encephalomalacia. Possible solitary chronic microhemorrhage in the right corona radiata on series 15, image 29. Deep gray nuclei and brainstem appear normal. Normal cerebellum aside from possible tiny chronic infarct in the left hemisphere on series 12, image 6. Vascular: Major intracranial vascular flow voids are preserved. Generalized mild intracranial artery tortuosity. Skull and upper cervical spine: Cervical spine is detailed separately. Visualized bone marrow signal is within normal limits. Sinuses/Orbits: Postoperative changes to both globes. Paranasal Visualized paranasal sinuses and mastoids are stable and well aerated. Other: Visible internal auditory structures appear normal. Negative visible scalp and face. IMPRESSION: 1. Positive for two tiny cortical infarcts in the left parietal lobe - right side sensory representation area. No associated hemorrhage or mass effect. 2. Otherwise advanced but nonspecific signal changes in the bilateral cerebral white matter, most commonly due to chronic small vessel disease. Possible chronic microhemorrhage in the right corona radiata, and tiny chronic infarct in the left cerebellum. 3. Cervical Spine MRI today reported separately. Electronically Signed   By: Genevie Ann M.D.   On: 08/13/2022 04:27   DG Chest 2 View  Result Date: 08/12/2022 CLINICAL DATA:  left arm tingling EXAM: CHEST - 2 VIEW COMPARISON:  Chest x-ray 08/08/2022 FINDINGS: The heart and mediastinal contours are unchanged. Persistent  bibasilar streaky airspace opacities likely representing atelectasis persistent elevation of left hemidiaphragm. No focal consolidation. No pulmonary edema. Persistent likely small left pleural effusion. No pneumothorax. No acute osseous abnormality. IMPRESSION: Persistent likely small left pleural effusion. Electronically Signed   By: Iven Finn M.D.   On: 08/12/2022 21:25    Procedures Procedures     Medications Ordered in ED Medications  acetaminophen (TYLENOL) tablet 650 mg (650 mg Oral Given 08/13/22 0220)  LORazepam (ATIVAN) tablet 0.5 mg (0.5 mg Oral Given 08/13/22 0220)    ED Course/ Medical Decision Making/ A&P Clinical Course as of 08/13/22 0617  Fri Aug 13, 2022  0021 Troponin I (High Sensitivity)(!!): 261 Elevated troponin noted [DW]  0021 WBC(!): 11.0 Mild leukocytosis [DW]  0211 Patient with recent cardiac cath and has been doing well.  However now having numbness in her left upper arm and left upper thigh.  No other neurodeficits.  She is not a candidate for any intervention or thrombolytics.  Due to recent cardiac cath, will recommend MRI to evaluate for any stroke or cervical spine issue post procedure [DW]  0505 Discussed results with on-call neurology.  She will see the patient.  Patient updated on plan.  No acute change [DW]  5916 Patient seen by neurology.  She has very low concern for acute stroke.  Suspect her issues are peripheral neuropathies.  Neurologist request patient to stop estrogen use.  Follow-up with cardiology as she may need to wear a Zio patch [DW]    Clinical Course User Index [DW] Ripley Fraise, MD                           Medical Decision Making Amount and/or Complexity of Data Reviewed Labs: ordered. Decision-making details documented in ED Course. Radiology: ordered.  Risk OTC drugs. Prescription drug management.   This patient presents to the ED for concern of numbness, this involves an extensive number of treatment options, and is a complaint that carries with it a high risk of complications and morbidity.  The differential diagnosis includes but is not limited to ACS, CVA, radiculopathy, electrolyte abnormality  Comorbidities that complicate the patient evaluation: Patient's presentation is complicated by their history of recent MI and cardiac cath   Additional history obtained: Additional history obtained from family and  spouse Records reviewed previous admission documents  Lab Tests: I Ordered, and personally interpreted labs.  The pertinent results include: leukocytosis Elevated troponin likely related to recent MI and is trending down   Imaging Studies ordered: I ordered imaging studies including X-ray chest   I independently visualized and interpreted imaging which showed no acute findings I agree with the radiologist interpretation    Medicines ordered and prescription drug management: I ordered medication including Tylenol for  mild headache Reevaluation of the patient after these medicines showed that the patient    improved   Consultations Obtained: I requested consultation with the consultant neurology drd bhagat , and discussed  findings as well as pertinent plan - they recommend: d/c home  Reevaluation: After the interventions noted above, I reevaluated the patient and found that they have :improved  Complexity of problems addressed: Patient's presentation is most consistent with  acute presentation with potential threat to life or bodily function  Disposition: After consideration of the diagnostic results and the patient's response to treatment,  I feel that the patent would benefit from discharge   .    Suspect elevated troponin is from recent  MI that is trending down, no acute work-up required       Final Clinical Impression(s) / ED Diagnoses Final diagnoses:  Paresthesias    Rx / DC Orders ED Discharge Orders     None         Ripley Fraise, MD 08/13/22 [phone]

## 2022-08-20 ENCOUNTER — Ambulatory Visit (INDEPENDENT_AMBULATORY_CARE_PROVIDER_SITE_OTHER): Payer: Medicare HMO | Admitting: Nurse Practitioner

## 2022-08-20 ENCOUNTER — Other Ambulatory Visit: Payer: Self-pay

## 2022-08-20 ENCOUNTER — Encounter: Payer: Self-pay | Admitting: Nurse Practitioner

## 2022-08-20 ENCOUNTER — Other Ambulatory Visit: Payer: Self-pay | Admitting: Nurse Practitioner

## 2022-08-20 ENCOUNTER — Ambulatory Visit: Payer: TRICARE For Life (TFL)

## 2022-08-20 VITALS — BP 124/78 | HR 70 | Ht 64.0 in | Wt 179.0 lb

## 2022-08-20 DIAGNOSIS — I639 Cerebral infarction, unspecified: Secondary | ICD-10-CM

## 2022-08-20 DIAGNOSIS — E785 Hyperlipidemia, unspecified: Secondary | ICD-10-CM

## 2022-08-20 DIAGNOSIS — I440 Atrioventricular block, first degree: Secondary | ICD-10-CM

## 2022-08-20 DIAGNOSIS — I10 Essential (primary) hypertension: Secondary | ICD-10-CM

## 2022-08-20 DIAGNOSIS — I2510 Atherosclerotic heart disease of native coronary artery without angina pectoris: Secondary | ICD-10-CM

## 2022-08-20 DIAGNOSIS — I252 Old myocardial infarction: Secondary | ICD-10-CM

## 2022-08-20 DIAGNOSIS — Z8673 Personal history of transient ischemic attack (TIA), and cerebral infarction without residual deficits: Secondary | ICD-10-CM

## 2022-08-20 MED ORDER — METOPROLOL TARTRATE 25 MG PO TABS: 12.5000 mg | ORAL_TABLET | Freq: Two times a day (BID) | ORAL | 3 refills | Status: DC

## 2022-08-20 MED ORDER — ASPIRIN 81 MG PO TBEC: 81.0000 mg | DELAYED_RELEASE_TABLET | Freq: Every day | ORAL | 3 refills | Status: DC

## 2022-08-20 MED ORDER — ROSUVASTATIN CALCIUM 40 MG PO TABS: 40.0000 mg | ORAL_TABLET | Freq: Every day | ORAL | 3 refills | Status: DC

## 2022-08-20 MED ORDER — TICAGRELOR 90 MG PO TABS: 90.0000 mg | ORAL_TABLET | Freq: Two times a day (BID) | ORAL | 2 refills | Status: DC

## 2022-08-20 NOTE — Progress Notes (Signed)
Office Visit    Patient Name: Katherine Short Date of Encounter: 08/20/2022  Primary Care Provider:  Valentino Nose, FNP Primary Cardiologist:  Peter Martinique, MD  Chief Complaint    71 year old female with a history of CAD s/p STEMI, DES-RCA in 07/2022, CVA, hypertension, hyperlipidemia, GERD, and anxiety who presents for posthospital follow-up related to CAD s/p STEMI.  Past Medical History    Past Medical History:  Diagnosis Date   Anxiety    Chest pain 03/19/2010   nuclear study was negative   Depression    Dyslipidemia    GERD (gastroesophageal reflux disease)    Headache(784.0)    Hypertension    STEMI (ST elevation myocardial infarction) [facility])    Past Surgical History:  Procedure Laterality Date   BREAST LUMPECTOMY WITH RADIOACTIVE SEED LOCALIZATION Left 07/07/2017   Procedure: LEFT BREAST LUMPECTOMY WITH RADIOACTIVE SEED LOCALIZATION;  Surgeon: Jovita Kussmaul, MD;  Location: [place];  Service: General;  Laterality: Left;   BREAST LUMPECTOMY WITH RADIOACTIVE SEED LOCALIZATION Left 07/18/2017   Procedure: LEFT BREAST LUMPECTOMY WITH RADIOACTIVE SEED LOCALIZATION;  Surgeon: Jovita Kussmaul, MD;  Location: [place];  Service: General;  Laterality: Left;   CATARACT EXTRACTION W/PHACO  11/22/2011   Procedure: CATARACT EXTRACTION PHACO AND INTRAOCULAR LENS PLACEMENT ([place]);  Surgeon: Tonny Branch;  Location: [facility];  Service: Ophthalmology;  Laterality: Left;  CDE:7.76   CATARACT EXTRACTION W/PHACO  12/02/2011   Procedure: CATARACT EXTRACTION PHACO AND INTRAOCULAR LENS PLACEMENT ([facility]);  Surgeon: Tonny Branch;  Location: [facility];  Service: Ophthalmology;  Laterality: Right;  CDE=11.92   COLONOSCOPY     CORONARY/GRAFT ACUTE MI REVASCULARIZATION N/A 08/08/2022   Procedure: Coronary/Graft Acute MI Revascularization;  Surgeon: Martinique, Peter M, MD;  Location: [place] [facility];  Service: Cardiovascular;  Laterality: N/A;   LEFT HEART CATH AND CORONARY ANGIOGRAPHY N/A 08/08/2022    Procedure: LEFT HEART CATH AND CORONARY ANGIOGRAPHY;  Surgeon: Martinique, Peter M, MD;  Location: [place] [facility];  Service: Cardiovascular;  Laterality: N/A;   TUBAL LIGATION      Allergies  Allergies  Allergen Reactions   Penicillins Itching and Rash         History of Present Illness    71 year old female with the above past medical history including CAD s/p STEMI, DES-RCA in 07/2022, CVA, hypertension, hyperlipidemia, GERD, and anxiety.  Previously followed by Dr. Debara Pickett. Myoview in March 2011 was low risk.  Echocardiogram at the time was normal.  She was last seen in the office on 08/16/2014 and was stable from a cardiac standpoint.  He has not been seen in follow-up since.  She presented to the ED on 08/08/2022 with acute onset chest pain.  EKG showed inferior ST elevation with reciprocal changes in anterior leads.  She was transferred to [facility] for emergent cardiac catheterization which revealed acute occlusion of the PL branch of RCA s/p DES, otherwise diffuse moderate disease and small caliber LAD and diagonal vessels, EF 55-65%.  She was hospitalized from 08/08/2022 to 08/10/2022 in the setting of STEMI.  Echocardiogram showed EF 60 to 65%,  normal LV function, no RWMA, G1 DD, reduced RV systolic function, mild LA enlargement, and mild mitral valve regurgitation.  Consideration of outpatient Myoview in 3 to 6 months was recommended to reassess residual LAD stenosis and evaluate for anterior wall ischemia.  She was started on aspirin, Brilinta, low-dose metoprolol, and Crestor (previously on simvastatin).  Home Benicar was held at discharge due to  borderline BP.  She presented to the ED on 08/12/2022 with reports of left arm tingling, left hip and left leg tingling and diaphoresis.  Troponin was elevated in the setting of recent STEMI.  Chest x-ray showed persistent small left pleural effusion. EKG was unchanged. Neurology was consulted. MRI of the brain showed 2 small cortical infarcts in  the left parietal lobe, chronic microhemorrhage in the right corona radiata, and tiny chronic infarct in the left cerebellum. MRA, carotid ultrasound, and Zio patch were recommended.  She was discharged home in stable condition.   She presents today for follow-up accompanied by her daughter. Since her hospitalization and recent ED visit been stable from a cardiac standpoint.  Since her hospitalization she has noted some generalized fatigue, she denies any symptoms concerning for angina.  She has had an ongoing headache.  States she was not referred to neurology as an outpatient following her ED visit.  She is asking if she can follow-up with cardiology in [place] as this is where she lives.  Otherwise, she denies any additional concerns today.  Home Medications    Current Outpatient Medications  Medication Sig Dispense Refill   acetaminophen (TYLENOL) 500 MG tablet Take 500 mg by mouth every 6 (six) hours as needed for moderate pain.     acyclovir (ZOVIRAX) 200 MG capsule Take 200 mg by mouth 2 (two) times daily.      gabapentin (NEURONTIN) 300 MG capsule Take 300 mg by mouth 3 (three) times daily.  1   meclizine (ANTIVERT) 25 MG tablet Take 1 tablet (25 mg total) by mouth 4 (four) times daily as needed for dizziness. (Patient taking differently: Take 25 mg by mouth 3 (three) times daily as needed for dizziness.) 40 tablet 0   nitroGLYCERIN (NITROSTAT) 0.4 MG SL tablet Place 1 tablet (0.4 mg total) under the tongue every 5 (five) minutes as needed for chest pain. 25 tablet 2   OZEMPIC, 1 MG/DOSE, 4 MG/3ML SOPN Inject 1 mg into the skin once a week.     pantoprazole (PROTONIX) 40 MG tablet Take 40 mg by mouth daily.     temazepam (RESTORIL) 30 MG capsule Take 30 mg by mouth at bedtime.       aspirin EC 81 MG tablet Take 1 tablet (81 mg total) by mouth daily. Swallow whole. 90 tablet 3   metoprolol tartrate (LOPRESSOR) 25 MG tablet Take 1/2 tablet (12.5 mg total) by mouth 2 (two) times daily. 90  tablet 3   rosuvastatin (CRESTOR) 40 MG tablet Take 1 tablet (40 mg total) by mouth daily. 90 tablet 3   ticagrelor (BRILINTA) 90 MG TABS tablet Take 1 tablet (90 mg total) by mouth 2 (two) times daily. 180 tablet 2   No current facility-administered medications for this visit.     Review of Systems    She denies chest pain, palpitations, dyspnea, pnd, orthopnea, n, v, dizziness, syncope, edema, weight gain, or early satiety. All other systems reviewed and are otherwise negative except as noted above.     Cardiac Rehabilitation Eligibility Assessment  The patient is ready to start cardiac rehabilitation from a cardiac standpoint.    Physical Exam    VS:  BP 124/78 (BP Location: Right Arm, Patient Position: Sitting, Cuff Size: Normal)   Pulse 70   Ht '5\' 4"'$  (1.626 m)   Wt 179 lb (81.2 kg)   BMI 30.73 kg/m  GEN: Well nourished, well developed, in no acute distress. HEENT: normal. Neck: Supple, no JVD, carotid  bruits, or masses. Cardiac: RRR, no murmurs, rubs, or gallops. No clubbing, cyanosis, edema.  Radials/DP/PT 2+ and equal bilaterally.  Right radial cath site without bruising, bleeding, or hematoma. Respiratory:  Respirations regular and unlabored, clear to auscultation bilaterally. GI: Soft, nontender, nondistended, BS + x 4. MS: no deformity or atrophy. Skin: warm and dry, no rash. Neuro:  Strength and sensation are intact. Psych: Normal affect.  Accessory Clinical Findings    ECG personally reviewed by me today - No EKG in office today.   Lab Results  Component Value Date   WBC 11.0 (H) 08/12/2022   HGB 14.1 08/12/2022   HCT 43.8 08/12/2022   MCV 92.6 08/12/2022   PLT 337 08/12/2022   Lab Results  Component Value Date   CREATININE 1.15 (H) 08/12/2022   BUN 12 08/12/2022   NA 142 08/12/2022   K 5.1 08/12/2022   CL 107 08/12/2022   CO2 26 08/12/2022   Lab Results  Component Value Date   ALT 22 08/08/2022   AST 24 08/08/2022   ALKPHOS 69 08/08/2022    BILITOT 0.5 08/08/2022   Lab Results  Component Value Date   CHOL 133 08/08/2022   HDL 39 (L) 08/08/2022   LDLCALC 56 08/08/2022   TRIG 188 (H) 08/08/2022   CHOLHDL 3.4 08/08/2022    Lab Results  Component Value Date   HGBA1C 5.7 (H) 08/08/2022    Assessment & Plan    1. CAD: s/p STEMI, DES-RCA in 07/2022. Echo showed EF 60 to 65%, normal LV function, no RWMA, G1 DD, reduced RV systolic function, mild LA enlargement, and mild mitral valve regurgitation.  Consideration of outpatient Myoview in 3 to 6 months was recommended to reassess residual LAD stenosis and evaluate for anterior wall ischemia. Stable with no anginal symptoms. She would like to follow-up in [place] as this is where she lives currently. Discussed with Dr. Martinique, he is agreeable.  Additionally, we discussed possible Myoview in the future- per Dr. Martinique, in the absence of symptoms, there is no need to pursue this at this time.  She may begin cardiac rehab.  Continue aspirin, Brilinta, metoprolol, and Crestor.  2. CVA: Recent ED visit in the setting of left arm tingling, left hip and left leg tingling and diaphoresis MRI of the brain showed 2 small cortical infarcts in the left parietal lobe, chronic microhemorrhage in the right corona radiata, and tiny chronic infarct in the left cerebellum. MRA, carotid ultrasound, and Zio patch were recommended. I do not see an active referral to neurology.  Will place ambulatory referral to neurology.  Will check 14-day ZIO monitor, carotid Dopplers.  She is already on DAPT with aspirin and Brilinta.  Continue aspirin, Crestor.  3. Hypertension: BP well controlled. Continue current antihypertensive regimen.   4. Hyperlipidemia:  LDL was 56 in 07/2022.  She will be due for repeat lipids, LFTs in 6-8 weeks.  Continue aspirin, Crestor.  5. Disposition: Follow-up in 2-3 months.      Lenna Sciara, NP 08/20/2022, 4:37 PM

## 2022-08-20 NOTE — Patient Instructions (Addendum)
Medication Instructions:  Your physician recommends that you continue on your current medications as directed. Please refer to the Current Medication list given to you today.   *If you need a refill on your cardiac medications before your next appointment, please call your pharmacy*   Lab Work: NONE ordered at this time of appointment    If you have labs (blood work) drawn today and your tests are completely normal, you will receive your results only by: [place] (if you have MyChart) OR A paper copy in the mail If you have any lab test that is abnormal or we need to change your treatment, we will call you to review the results.   Testing/Procedures: [place] [place]- Long Term Monitor Instructions  Your physician has requested you wear a ZIO patch monitor for 14 days.  This is a single patch monitor. [employer] supplies one patch monitor per enrollment. Additional stickers are not available. Please do not apply patch if you will be having a Nuclear Stress Test,  Echocardiogram, Cardiac CT, MRI, or Chest Xray during the period you would be wearing the  monitor. The patch cannot be worn during these tests. You cannot remove and re-apply the  ZIO XT patch monitor.  Your ZIO patch monitor will be mailed 3 day USPS to your address on file. It may take 3-5 days  to receive your monitor after you have been enrolled.  Once you have received your monitor, please review the enclosed instructions. Your monitor  has already been registered assigning a specific monitor serial # to you.  Billing and Patient Assistance Program Information  We have supplied [employer] with any of your insurance information on file for billing purposes. [employer] offers a sliding scale Patient Assistance Program for patients that do not have  insurance, or whose insurance does not completely cover the cost of the ZIO monitor.  You must apply for the Patient Assistance Program to qualify for this discounted rate.  To apply,  please call [employer] at [phone], select option 4, select option 2, ask to apply for  Patient Assistance Program. Theodore Demark will ask your household income, and how many people  are in your household. They will quote your out-of-pocket cost based on that information.  [employer] will also be able to set up a 11-month interest-free payment plan if needed.  Applying the monitor   Shave hair from upper left chest.  Hold abrader disc by orange tab. Rub abrader in 40 strokes over the upper left chest as  indicated in your monitor instructions.  Clean area with 4 enclosed alcohol pads. Let dry.  Apply patch as indicated in monitor instructions. Patch will be placed under collarbone on left  side of chest with arrow pointing upward.  Rub patch adhesive wings for 2 minutes. Remove white label marked "1". Remove the white  label marked "2". Rub patch adhesive wings for 2 additional minutes.  While looking in a mirror, press and release button in center of patch. A small green light will  flash 3-4 times. This will be your only indicator that the monitor has been turned on.  Do not shower for the first 24 hours. You may shower after the first 24 hours.  Press the button if you feel a symptom. You will hear a small click. Record Date, Time and  Symptom in the Patient Logbook.  When you are ready to remove the patch, follow instructions on the last 2 pages of Patient  Logbook. Stick patch monitor onto the  last page of Patient Logbook.  Place Patient Logbook in the blue and white box. Use locking tab on box and tape box closed  securely. The blue and white box has prepaid postage on it. Please place it in the mailbox as  soon as possible. Your physician should have your test results approximately 7 days after the  monitor has been mailed back to [facility].  Call [place] at [phone] if you have questions regarding  your ZIO XT patch monitor. Call them immediately if you see  an orange light blinking on your  monitor.  If your monitor falls off in less than 4 days, contact our Monitor department at [phone].  If your monitor becomes loose or falls off after 4 days call [employer] at [phone] for  suggestions on securing your monitor   Your physician has requested that you have a carotid duplex. This test is an ultrasound of the carotid arteries in your neck. It looks at blood flow through these arteries that supply the brain with blood. Allow one hour for this exam. There are no restrictions or special instructions. Follow-Up: At [facility], you and your health needs are our priority.  As part of our continuing mission to provide you with exceptional heart care, we have created designated Provider Care Teams.  These Care Teams include your primary Cardiologist (physician) and Advanced Practice Providers (APPs -  Physician Assistants and Nurse Practitioners) who all work together to provide you with the care you need, when you need it.  We recommend signing up for the patient portal called "MyChart".  Sign up information is provided on this After Visit Summary.  MyChart is used to connect with patients for Virtual Visits (Telemedicine).  Patients are able to view lab/test results, encounter notes, upcoming appointments, etc.  Non-urgent messages can be sent to your provider as well.   To learn more about what you can do with MyChart, go to [email or web address].    Your next appointment:   3 month(s)  The format for your next appointment:   In Person  Provider:    Bernerd Pho, PA-C  Ermalinda Barrios, PA-C     Other Instructions Referral sent to neurology.   Important Information About [place]

## 2022-08-20 NOTE — Progress Notes (Unsigned)
Enrolled for [employer] to mail a ZIO XT long term holter monitor to the patients address on file.   Dr. Jordan to read. 

## 2022-08-24 ENCOUNTER — Encounter: Payer: Self-pay | Admitting: Neurology

## 2022-08-24 ENCOUNTER — Other Ambulatory Visit: Payer: Self-pay | Admitting: Neurology

## 2022-08-24 ENCOUNTER — Ambulatory Visit (INDEPENDENT_AMBULATORY_CARE_PROVIDER_SITE_OTHER): Payer: Medicare HMO | Admitting: Neurology

## 2022-08-24 ENCOUNTER — Ambulatory Visit
Admission: RE | Admit: 2022-08-24 | Discharge: 2022-08-24 | Disposition: A | Discharge status: Home / Self Care | Payer: Medicare HMO | Source: Ambulatory Visit | Attending: Neurology | Admitting: Neurology

## 2022-08-24 VITALS — BP 147/81 | HR 70 | Ht 64.0 in | Wt 178.5 lb

## 2022-08-24 DIAGNOSIS — G4733 Obstructive sleep apnea (adult) (pediatric): Secondary | ICD-10-CM

## 2022-08-24 DIAGNOSIS — M25552 Pain in left hip: Secondary | ICD-10-CM

## 2022-08-24 DIAGNOSIS — R202 Paresthesia of skin: Secondary | ICD-10-CM

## 2022-08-24 DIAGNOSIS — I639 Cerebral infarction, unspecified: Secondary | ICD-10-CM | POA: Diagnosis not present

## 2022-08-24 HISTORY — DX: Obstructive sleep apnea (adult) (pediatric): G47.33

## 2022-08-24 NOTE — Progress Notes (Signed)
Chief Complaint  Patient presents with   New Patient (Initial Visit)    Rm 15. Accompanied by sisters. NP internal referral for history of CVA.      ASSESSMENT AND PLAN  Katherine Short is a 71 y.o. female   Stroke  Small vessel disease, likely incidental findings, the location and the small size would not explain her left-sided symptoms,  She does have multiple vascular risk factors, aging, hypertension, hyperlipidemia, obesity, prediabetes, at risk for obstructive sleep apnea,  Ultrasound of carotid artery is pending to complete stroke evaluation  Continue aspirin and Brilinta  Obstructive sleep apnea  Referral to sleep study  Cervical degenerative changes  Likely explain her left neck pain, shoulder, left arm paresthesia  Left hip pain, low back pain  X-ray of left hip, lumbar  Return to clinic in 6 months with nurse practitioner     DIAGNOSTIC DATA (LABS, IMAGING, TESTING) - I reviewed patient records, labs, notes, testing and imaging myself where available.   MEDICAL HISTORY:  Katherine Short is a 71 year old right-handed female, accompanied by her 2 sisters, seen in request by her primary care nurse practitioner Diona Browner for evaluation of stroke, initial evaluation was on August 29 23  I reviewed and summarized the referring note.  Past medical history STEMI August 10, 2022, status post PCI, on aspirin and Brilinta dual antiplatelet agent Hypertension Hyperlipidemia Pre-DM GERD.   She had a heart attack August 10, 2022, had a stent put in, was put on dual antiplatelet agent aspirin plus Brilinta for 1 year, laboratory evaluations, triglyceride 188, LDL 56, A1c 5.7  August 13, 2022, she had sudden onset of numbness at left lateral arm, lateral leg, prompting her emergency room visit, over the past few weeks, she had worsening left hip pain, difficult to sleep on her left side, also complains of left neck pain, radiating pain to left shoulder  Personally  reviewed MRI of the brain, 2 tiny positive DWI lesion at the left parietal region, right sensory representation area, moderate small vessel disease,  MRI of cervical spine showed multilevel degenerative changes, moderate to severe foraminal narrowing at bilateral C4, C5, right C6, no evidence of spinal cord compression  Echocardiogram showed no significant abnormality Ultrasound of carotid arteries pending at cardiologist office August 25, 2022 She is wearing 2 weeks cardiac monitoring  She has small jaw, snore, catching her breath in her sleep, have daytime sleepiness and fatigue  PHYSICAL EXAM:   Vitals:   08/24/22 1536  BP: (!) 147/81  Pulse: 70  Weight: 178 lb 8 oz (81 kg)  Height: '5\' 4"'$  (1.626 m)   Not recorded     Body mass index is 30.64 kg/m.  PHYSICAL EXAMNIATION:  Gen: NAD, conversant, well nourised, well groomed                     Cardiovascular: Regular rate rhythm, no peripheral edema, warm, nontender. Eyes: Conjunctivae clear without exudates or hemorrhage Neck: Supple, no carotid bruits. Pulmonary: Clear to auscultation bilaterally   NEUROLOGICAL EXAM:  MENTAL STATUS: Speech/cognition: Awake, alert, oriented to history taking and casual conversation CRANIAL NERVES: CN II: Visual fields are full to confrontation. Pupils are round equal and briskly reactive to light. CN III, IV, VI: extraocular movement are normal. No ptosis. CN V: Facial sensation is intact to light touch CN VII: Face is symmetric with normal eye closure  CN VIII: Hearing is normal to causal conversation. CN IX, X: Phonation is normal.  CN XI: Head turning and shoulder shrug are intact CN XII: Narrow oropharyngeal space MOTOR: There is no pronator drift of out-stretched arms. Muscle bulk and tone are normal. Muscle strength is normal.,  Cross leg testing showed left hip pain  REFLEXES: Reflexes are 2+ and symmetric at the biceps, triceps, knees, and ankles. Plantar responses are  flexor.  SENSORY: Intact to light touch, pinprick and vibratory sensation are intact in fingers and toes.  COORDINATION: There is no trunk or limb dysmetria noted.  GAIT/STANCE: Posture is normal. Gait is steady with normal steps, base, arm swing, and turning. Heel and toe walking are normal. Tandem gait is normal.  Romberg is absent.  REVIEW OF SYSTEMS:  Full 14 system review of systems performed and notable only for as above All other review of systems were negative.   ALLERGIES: Allergies  Allergen Reactions   Penicillins Itching and Rash         HOME MEDICATIONS: Current Outpatient Medications  Medication Sig Dispense Refill   acetaminophen (TYLENOL) 500 MG tablet Take 500 mg by mouth every 6 (six) hours as needed for moderate pain.     acyclovir (ZOVIRAX) 200 MG capsule Take 200 mg by mouth 2 (two) times daily.      aspirin EC 81 MG tablet Take 1 tablet (81 mg total) by mouth daily. Swallow whole. 90 tablet 3   gabapentin (NEURONTIN) 300 MG capsule Take 300 mg by mouth 3 (three) times daily.  1   meclizine (ANTIVERT) 25 MG tablet Take 1 tablet (25 mg total) by mouth 4 (four) times daily as needed for dizziness. (Patient taking differently: Take 25 mg by mouth 3 (three) times daily as needed for dizziness.) 40 tablet 0   metoprolol tartrate (LOPRESSOR) 25 MG tablet Take 1/2 tablet (12.5 mg total) by mouth 2 (two) times daily. 90 tablet 3   nitroGLYCERIN (NITROSTAT) 0.4 MG SL tablet Place 1 tablet (0.4 mg total) under the tongue every 5 (five) minutes as needed for chest pain. 25 tablet 2   OZEMPIC, 1 MG/DOSE, 4 MG/3ML SOPN Inject 1 mg into the skin once a week.     pantoprazole (PROTONIX) 40 MG tablet Take 40 mg by mouth daily.     rosuvastatin (CRESTOR) 40 MG tablet Take 1 tablet (40 mg total) by mouth daily. 90 tablet 3   ticagrelor (BRILINTA) 90 MG TABS tablet Take 1 tablet (90 mg total) by mouth 2 (two) times daily. 180 tablet 2   No current facility-administered  medications for this visit.    PAST MEDICAL HISTORY: Past Medical History:  Diagnosis Date   Anxiety    Chest pain 03/19/2010   nuclear study was negative   Depression    Dyslipidemia    GERD (gastroesophageal reflux disease)    Headache(784.0)    Hypertension    STEMI (ST elevation myocardial infarction) ([place])     PAST SURGICAL HISTORY: Past Surgical History:  Procedure Laterality Date   BREAST LUMPECTOMY WITH RADIOACTIVE SEED LOCALIZATION Left 07/07/2017   Procedure: LEFT BREAST LUMPECTOMY WITH RADIOACTIVE SEED LOCALIZATION;  Surgeon: Jovita Kussmaul, MD;  Location: [facility];  Service: General;  Laterality: Left;   BREAST LUMPECTOMY WITH RADIOACTIVE SEED LOCALIZATION Left 07/18/2017   Procedure: LEFT BREAST LUMPECTOMY WITH RADIOACTIVE SEED LOCALIZATION;  Surgeon: Jovita Kussmaul, MD;  Location: [facility];  Service: General;  Laterality: Left;   CATARACT EXTRACTION W/PHACO  11/22/2011   Procedure: CATARACT EXTRACTION PHACO AND INTRAOCULAR LENS PLACEMENT ([facility]);  Surgeon: Levada Dy  Hunt;  Location: [facility];  Service: Ophthalmology;  Laterality: Left;  CDE:7.76   CATARACT EXTRACTION W/PHACO  12/02/2011   Procedure: CATARACT EXTRACTION PHACO AND INTRAOCULAR LENS PLACEMENT ([facility]);  Surgeon: Tonny Branch;  Location: [facility];  Service: Ophthalmology;  Laterality: Right;  CDE=11.92   COLONOSCOPY     CORONARY/GRAFT ACUTE MI REVASCULARIZATION N/A 08/08/2022   Procedure: Coronary/Graft Acute MI Revascularization;  Surgeon: Martinique, Peter M, MD;  Location: [facility];  Service: Cardiovascular;  Laterality: N/A;   LEFT HEART CATH AND CORONARY ANGIOGRAPHY N/A 08/08/2022   Procedure: LEFT HEART CATH AND CORONARY ANGIOGRAPHY;  Surgeon: Martinique, Peter M, MD;  Location: [facility];  Service: Cardiovascular;  Laterality: N/A;   TUBAL LIGATION      FAMILY HISTORY: Family History  Problem Relation Age of Onset   Cancer Mother 11       breast   Diabetes Father     SOCIAL  HISTORY: Social History   Socioeconomic History   Marital status: Married    Spouse name: Not on file   Number of children: Not on file   Years of education: Not on file   Highest education level: Not on file  Occupational History   Not on file  Tobacco Use   Smoking status: Never   Smokeless tobacco: Never  Vaping Use   Vaping Use: Never used  Substance and Sexual Activity   Alcohol use: No   Drug use: No   Sexual activity: Not on file  Other Topics Concern   Not on file  Social History Narrative   Not on file   Social Determinants of Health   Financial Resource Strain: Not on file  Food Insecurity: Not on file  Transportation Needs: Not on file  Physical Activity: Not on file  Stress: Not on file  Social Connections: Not on file  Intimate Partner Violence: Not on file      Marcial Pacas, M.D. Ph.D.  [facility] [address] [place], [place] [address] Ph: [phone] Fax: [phone]  CC:  Lenna Sciara, NP [address] [place],  [place] [address]  Valentino Nose, FNP

## 2022-08-24 NOTE — Patient Instructions (Signed)
[facility]    Address: [address], [place], [place] [address]  Phone: [phone]   

## 2022-08-25 ENCOUNTER — Telehealth: Payer: Self-pay

## 2022-08-25 ENCOUNTER — Ambulatory Visit (HOSPITAL_COMMUNITY)
Admission: RE | Admit: 2022-08-25 | Discharge: 2022-08-25 | Disposition: A | Discharge status: Home / Self Care | Payer: Medicare HMO | Source: Ambulatory Visit | Attending: Internal Medicine | Admitting: Internal Medicine

## 2022-08-25 DIAGNOSIS — Z8673 Personal history of transient ischemic attack (TIA), and cerebral infarction without residual deficits: Secondary | ICD-10-CM | POA: Insufficient documentation

## 2022-08-25 DIAGNOSIS — I2510 Atherosclerotic heart disease of native coronary artery without angina pectoris: Secondary | ICD-10-CM | POA: Insufficient documentation

## 2022-08-25 NOTE — Telephone Encounter (Signed)
Spoke with pt. Pt was notified of results and will follow up as planned.

## 2022-09-02 NOTE — Progress Notes (Signed)
Included in order set

## 2022-09-09 ENCOUNTER — Encounter (HOSPITAL_COMMUNITY): Payer: Self-pay

## 2022-09-09 ENCOUNTER — Encounter (HOSPITAL_COMMUNITY)
Admission: RE | Admit: 2022-09-09 | Discharge: 2022-09-09 | Disposition: A | Discharge status: Home / Self Care | Payer: Medicare HMO | Source: Ambulatory Visit | Attending: Cardiology | Admitting: Cardiology

## 2022-09-09 VITALS — BP 116/80 | HR 79 | Ht 64.0 in | Wt 181.7 lb

## 2022-09-09 DIAGNOSIS — I2111 ST elevation (STEMI) myocardial infarction involving right coronary artery: Secondary | ICD-10-CM | POA: Insufficient documentation

## 2022-09-09 DIAGNOSIS — Z955 Presence of coronary angioplasty implant and graft: Secondary | ICD-10-CM | POA: Insufficient documentation

## 2022-09-09 LAB — GLUCOSE, CAPILLARY: Glucose-Capillary: 138 mg/dL — ABNORMAL HIGH (ref 70–99)

## 2022-09-09 NOTE — Progress Notes (Signed)
Cardiac Individual Treatment Plan  Patient Details  Name: Katherine Short MRN: [id number] Date of Birth: 05-13-1951 Referring Provider:   Flowsheet Row CARDIAC REHAB PHASE II ORIENTATION from 09/09/2022 in [place]  Referring Provider Dr. Martinique       Initial Encounter Date:  Flowsheet Row CARDIAC REHAB PHASE II ORIENTATION from 09/09/2022 in [place]  Date 09/09/22       Visit Diagnosis: ST elevation myocardial infarction involving right coronary artery [facility])  Status post coronary artery stent placement  Patient's Home Medications on Admission:  Current Outpatient Medications:    acetaminophen (TYLENOL) 500 MG tablet, Take 500 mg by mouth every 6 (six) hours as needed for moderate pain., Disp: , Rfl:    acyclovir (ZOVIRAX) 200 MG capsule, Take 200 mg by mouth 2 (two) times daily. , Disp: , Rfl:    aspirin EC 81 MG tablet, Take 1 tablet (81 mg total) by mouth daily. Swallow whole., Disp: 90 tablet, Rfl: 3   gabapentin (NEURONTIN) 300 MG capsule, Take 300 mg by mouth 3 (three) times daily., Disp: , Rfl: 1   meclizine (ANTIVERT) 25 MG tablet, Take 1 tablet (25 mg total) by mouth 4 (four) times daily as needed for dizziness. (Patient taking differently: Take 25 mg by mouth 3 (three) times daily as needed for dizziness.), Disp: 40 tablet, Rfl: 0   metoprolol tartrate (LOPRESSOR) 25 MG tablet, Take 1/2 tablet (12.5 mg total) by mouth 2 (two) times daily., Disp: 90 tablet, Rfl: 3   nitroGLYCERIN (NITROSTAT) 0.4 MG SL tablet, Place 1 tablet (0.4 mg total) under the tongue every 5 (five) minutes as needed for chest pain., Disp: 25 tablet, Rfl: 2   OZEMPIC, 1 MG/DOSE, 4 MG/3ML SOPN, Inject 1 mg into the skin once a week., Disp: , Rfl:    pantoprazole (PROTONIX) 40 MG tablet, Take 40 mg by mouth daily., Disp: , Rfl:    rosuvastatin (CRESTOR) 40 MG tablet, Take 1 tablet (40 mg total) by mouth daily., Disp: 90 tablet, Rfl: 3   ticagrelor  (BRILINTA) 90 MG TABS tablet, Take 1 tablet (90 mg total) by mouth 2 (two) times daily., Disp: 180 tablet, Rfl: 2  Past Medical History: Past Medical History:  Diagnosis Date   Anxiety    Chest pain 03/19/2010   nuclear study was negative   Depression    Dyslipidemia    GERD (gastroesophageal reflux disease)    Headache(784.0)    Hypertension    Obstructive sleep apnea 08/24/2022   STEMI (ST elevation myocardial infarction) ([place])     Tobacco Use: Social History   Tobacco Use  Smoking Status Never  Smokeless Tobacco Never    Labs: Review Flowsheet       Latest Ref Rng & Units 05/25/2013 08/14/2014 08/08/2022  Labs for ITP Cardiac and Pulmonary Rehab  Cholestrol 0 - 200 mg/dL 166  180  133   LDL (calc) 0 - 99 mg/dL 104  107  56   HDL-C >40 mg/dL 47  52  39   Trlycerides <150 mg/dL 76  106  188   Hemoglobin A1c 4.8 - 5.6 % - - 5.7     Capillary Blood Glucose: Lab Results  Component Value Date   GLUCAP 138 (H) 09/09/2022   GLUCAP 144 (H) 08/09/2022    POCT Glucose     Row Name 09/09/22 0833             POCT Blood Glucose   Pre-Exercise 138 mg/dL  Exercise Target Goals: Exercise Program Goal: Individual exercise prescription set using results from initial 6 min walk test and THRR while considering  patient's activity barriers and safety.   Exercise Prescription Goal: Starting with aerobic activity 30 plus minutes a day, 3 days per week for initial exercise prescription. Provide home exercise prescription and guidelines that participant acknowledges understanding prior to discharge.  Activity Barriers & Risk Stratification:  Activity Barriers & Cardiac Risk Stratification - 09/09/22 0854       Activity Barriers & Cardiac Risk Stratification   Activity Barriers Arthritis;Neck/Spine Problems;Balance Concerns;History of Falls    Cardiac Risk Stratification High             6 Minute Walk:  6 Minute Walk     Row Name 09/09/22 0959          6 Minute Walk   Phase Initial     Distance 1400 feet     Walk Time 6 minutes     # of Rest Breaks 0     MPH 2.65     METS 2.71     RPE 11     VO2 Peak 9.49     Symptoms No     Resting HR 79 bpm     Resting BP 116/80     Resting Oxygen Saturation  95 %     Exercise Oxygen Saturation  during 6 min walk 98 %     Max Ex. HR 95 bpm     Max Ex. BP 130/70     2 Minute Post BP 114/70              Oxygen Initial Assessment:   Oxygen Re-Evaluation:   Oxygen Discharge (Final Oxygen Re-Evaluation):   Initial Exercise Prescription:  Initial Exercise Prescription - 09/09/22 1000       Date of Initial Exercise RX and Referring Provider   Date 09/09/22    Referring Provider Dr. Martinique    Expected Discharge Date 12/03/22      Treadmill   MPH 1.7    Grade 0    Minutes 17      NuStep   Level 1    SPM 60    Minutes 22      Prescription Details   Frequency (times per week) 3    Duration Progress to 30 minutes of continuous aerobic without signs/symptoms of physical distress      Intensity   THRR 40-80% of Max Heartrate 60-119    Ratings of Perceived Exertion 11-13      Resistance Training   Training Prescription Yes    Weight 3    Reps 10-15             Perform Capillary Blood Glucose checks as needed.  Exercise Prescription Changes:   Exercise Comments:   Exercise Goals and Review:   Exercise Goals     Row Name 09/09/22 1001             Exercise Goals   Increase Physical Activity Yes       Intervention Provide advice, education, support and counseling about physical activity/exercise needs.;Develop an individualized exercise prescription for aerobic and resistive training based on initial evaluation findings, risk stratification, comorbidities and participant's personal goals.       Expected Outcomes Short Term: Attend rehab on a regular basis to increase amount of physical activity.;Long Term: Add in home exercise to make exercise part  of routine and to increase amount of physical activity.;Long Term:  Exercising regularly at least 3-5 days a week.       Increase Strength and Stamina Yes       Intervention Provide advice, education, support and counseling about physical activity/exercise needs.;Develop an individualized exercise prescription for aerobic and resistive training based on initial evaluation findings, risk stratification, comorbidities and participant's personal goals.       Expected Outcomes Short Term: Increase workloads from initial exercise prescription for resistance, speed, and METs.;Short Term: Perform resistance training exercises routinely during rehab and add in resistance training at home;Long Term: Improve cardiorespiratory fitness, muscular endurance and strength as measured by increased METs and functional capacity (6MWT)       Able to understand and use rate of perceived exertion (RPE) scale Yes       Intervention Provide education and explanation on how to use RPE scale       Expected Outcomes Short Term: Able to use RPE daily in rehab to express subjective intensity level;Long Term:  Able to use RPE to guide intensity level when exercising independently       Knowledge and understanding of Target Heart Rate Range (THRR) Yes       Intervention Provide education and explanation of THRR including how the numbers were predicted and where they are located for reference       Expected Outcomes Short Term: Able to state/look up THRR;Short Term: Able to use daily as guideline for intensity in rehab;Long Term: Able to use THRR to govern intensity when exercising independently       Able to check pulse independently Yes       Intervention Provide education and demonstration on how to check pulse in carotid and radial arteries.;Review the importance of being able to check your own pulse for safety during independent exercise       Expected Outcomes Short Term: Able to explain why pulse checking is important during  independent exercise;Long Term: Able to check pulse independently and accurately       Understanding of Exercise Prescription Yes       Intervention Provide education, explanation, and written materials on patient's individual exercise prescription       Expected Outcomes Short Term: Able to explain program exercise prescription;Long Term: Able to explain home exercise prescription to exercise independently                Exercise Goals Re-Evaluation :    Discharge Exercise Prescription (Final Exercise Prescription Changes):   Nutrition:  Target Goals: Understanding of nutrition guidelines, daily intake of sodium '1500mg'$ , cholesterol '200mg'$ , calories 30% from fat and 7% or less from saturated fats, daily to have 5 or more servings of fruits and vegetables.  Biometrics:  Pre Biometrics - 09/09/22 1002       Pre Biometrics   Height '5\' 4"'$  (1.626 m)    Weight 181 lb 10.5 oz (82.4 kg)    Waist Circumference 38 inches    Hip Circumference 43.5 inches    Waist to Hip Ratio 0.87 %    BMI (Calculated) 31.17    Triceps Skinfold 24 mm    % Body Fat 41.3 %    Grip Strength 23.5 kg    Flexibility 8 in    Single Leg Stand 11.8 seconds              Nutrition Therapy Plan and Nutrition Goals:  Nutrition Therapy & Goals - 09/09/22 0856       Intervention Plan   Intervention Nutrition handout(s) given to  patient.    Expected Outcomes Long Term Goal: Adherence to prescribed nutrition plan.             Nutrition Assessments:  Nutrition Assessments - 09/09/22 0857       MEDFICTS Scores   Pre Score 42            MEDIFICTS Score Key: ?70 Need to make dietary changes  40-70 Heart Healthy Diet ? 40 Therapeutic Level Cholesterol Diet   Picture Your Plate Scores: <47 Unhealthy dietary pattern with much room for improvement. 41-50 Dietary pattern unlikely to meet recommendations for good health and room for improvement. 51-60 More healthful dietary pattern, with  some room for improvement.  >60 Healthy dietary pattern, although there may be some specific behaviors that could be improved.    Nutrition Goals Re-Evaluation:   Nutrition Goals Discharge (Final Nutrition Goals Re-Evaluation):   Psychosocial: Target Goals: Acknowledge presence or absence of significant depression and/or stress, maximize coping skills, provide positive support system. Participant is able to verbalize types and ability to use techniques and skills needed for reducing stress and depression.  Initial Review & Psychosocial Screening:  Initial Psych Review & Screening - 09/09/22 0921       Initial Review   Current issues with Current Sleep Concerns;Current Stress Concerns;History of Depression    Source of Stress Concerns Chronic Illness    Comments Due to a heart attack and a CVA within a couple of weeks of each other      [place]? Yes    Comments Her sisters and her husband are her main support system.      Barriers   Psychosocial barriers to participate in program Psychosocial barriers identified (see note)      Screening Interventions   Interventions Encouraged to exercise    Expected Outcomes Long Term goal: The participant improves quality of Life and PHQ9 Scores as seen by post scores and/or verbalization of changes;Short Term goal: Identification and review with participant of any Quality of Life or Depression concerns found by scoring the questionnaire.             Quality of Life Scores:  Quality of Life - 09/09/22 1002       Quality of Life   Select Quality of Life      Quality of Life Scores   Health/Function Pre 19.53 %    Socioeconomic Pre 29.63 %    Psych/Spiritual Pre 17.93 %    Family Pre 21 %    GLOBAL Pre 20.81 %            Scores of 19 and below usually indicate a poorer quality of life in these areas.  A difference of  2-3 points is a clinically meaningful difference.  A difference of 2-3 points  in the total score of the Quality of Life Index has been associated with significant improvement in overall quality of life, self-image, physical symptoms, and general health in studies assessing change in quality of life.  PHQ-9: Review Flowsheet       09/09/2022 06/17/2021  Depression screen PHQ 2/9  Decreased Interest 3 1  Down, Depressed, Hopeless 3 1  PHQ - 2 Score 6 2  Altered sleeping 3 1  Tired, decreased energy 3 1  Change in appetite 3 1  Feeling bad or failure about yourself  3 0  Trouble concentrating 3 0  Moving slowly or fidgety/restless 2 0  Suicidal thoughts 0 0  PHQ-9  Score 23 5  Difficult doing work/chores Somewhat difficult Somewhat difficult   Interpretation of Total Score  Total Score Depression Severity:  1-4 = Minimal depression, 5-9 = Mild depression, 10-14 = Moderate depression, 15-19 = Moderately severe depression, 20-27 = Severe depression   Psychosocial Evaluation and Intervention:  Psychosocial Evaluation - 09/09/22 0922       Psychosocial Evaluation & Interventions   Interventions Encouraged to exercise with the program and follow exercise prescription    Comments Pt has no barriers to partcipating in CR. She has a history of depression and formerly took Bupropion. She scored a 23 on her PHQ-9, but she denies having depression currently. She states that her current state is due to her health problems. She has a STEMI and a CVA within two weeks of each other. She feels like she does not have any energy, so she does not feel like doing anything. Her current health concerns are a source of stress for her. She also has sleep concerns and she formerly took Trazodone, but she reports not taking anything currently. Dr. Krista Blue, from [facility], suspects that her altered sleep may be due to underlying OSA, so she has a sleep study scheduled. She denies any thoughts of harming herself, and overall has a positive outlook despite her high PHQ-9 score.  She reports that she has a good support system with her husband and her sisters. Her goals while in the program are to improve her energy levels and to improve her sleep. She has already begun taking aqua aerobics classes at the [facility], and she looks forward to participating in CR as well.    Expected Outcomes Pt's depression and stress related symptoms will reduce as she continues to recover from her STEMI and CVA. Her underlying sleep issues will be determined and treated.    Continue Psychosocial Services  Follow up required by staff             Psychosocial Re-Evaluation:   Psychosocial Discharge (Final Psychosocial Re-Evaluation):   Vocational Rehabilitation: Provide vocational rehab assistance to qualifying candidates.   Vocational Rehab Evaluation & Intervention:  Vocational Rehab - 09/09/22 0835       Initial Vocational Rehab Evaluation & Intervention   Assessment shows need for Vocational Rehabilitation No      Vocational Rehab Re-Evaulation   Comments She is retired             Education: Education Goals: Education classes will be provided on a weekly basis, covering required topics. Participant will state understanding/return demonstration of topics presented.  Learning Barriers/Preferences:  Learning Barriers/Preferences - 09/09/22 0835       Learning Barriers/Preferences   Learning Barriers None    Learning Preferences Audio             Education Topics: Hypertension, Hypertension Reduction -Define heart disease and high blood pressure. Discus how high blood pressure affects the body and ways to reduce high blood pressure.   Exercise and Your Heart -Discuss why it is important to exercise, the FITT principles of exercise, normal and abnormal responses to exercise, and how to exercise safely.   Angina -Discuss definition of angina, causes of angina, treatment of angina, and how to decrease risk of having angina.   Cardiac Medications -Review  what the following cardiac medications are used for, how they affect the body, and side effects that may occur when taking the medications.  Medications include Aspirin, Beta blockers, calcium channel blockers, ACE Inhibitors, angiotensin receptor blockers, diuretics,  digoxin, and antihyperlipidemics.   Congestive Heart Failure -Discuss the definition of CHF, how to live with CHF, the signs and symptoms of CHF, and how keep track of weight and sodium intake.   Heart Disease and Intimacy -Discus the effect sexual activity has on the heart, how changes occur during intimacy as we age, and safety during sexual activity.   Smoking Cessation / COPD -Discuss different methods to quit smoking, the health benefits of quitting smoking, and the definition of COPD.   Nutrition I: Fats -Discuss the types of cholesterol, what cholesterol does to the heart, and how cholesterol levels can be controlled.   Nutrition II: Labels -Discuss the different components of food labels and how to read food label   Heart Parts/Heart Disease and PAD -Discuss the anatomy of the heart, the pathway of blood circulation through the heart, and these are affected by heart disease.   Stress I: Signs and Symptoms -Discuss the causes of stress, how stress may lead to anxiety and depression, and ways to limit stress.   Stress II: Relaxation -Discuss different types of relaxation techniques to limit stress.   Warning Signs of Stroke / TIA -Discuss definition of a stroke, what the signs and symptoms are of a stroke, and how to identify when someone is having stroke.   Knowledge Questionnaire Score:  Knowledge Questionnaire Score - 09/09/22 0834       Knowledge Questionnaire Score   Pre Score 19/24             Core Components/Risk Factors/Patient Goals at Admission:  Personal Goals and Risk Factors at Admission - 09/09/22 0900       Core Components/Risk Factors/Patient Goals on Admission    Hypertension Yes    Intervention Provide education on lifestyle modifcations including regular physical activity/exercise, weight management, moderate sodium restriction and increased consumption of fresh fruit, vegetables, and low fat dairy, alcohol moderation, and smoking cessation.;Monitor prescription use compliance.    Expected Outcomes Short Term: Continued assessment and intervention until BP is < 140/31m HG in hypertensive participants. < 130/816mHG in hypertensive participants with diabetes, heart failure or chronic kidney disease.;Long Term: Maintenance of blood pressure at goal levels.    Lipids Yes    Intervention Provide education and support for participant on nutrition & aerobic/resistive exercise along with prescribed medications to achieve LDL '70mg'$ , HDL >'40mg'$ .    Expected Outcomes Short Term: Participant states understanding of desired cholesterol values and is compliant with medications prescribed. Participant is following exercise prescription and nutrition guidelines.;Long Term: Cholesterol controlled with medications as prescribed, with individualized exercise RX and with personalized nutrition plan. Value goals: LDL < '70mg'$ , HDL > 40 mg.    Personal Goal Other Yes    Personal Goal Improve energy levels and improve sleeping.    Intervention Attend CR three days per week and continue to exercise at home 2-4 days per week.    Expected Outcomes Pt will meet stated goals.             Core Components/Risk Factors/Patient Goals Review:    Core Components/Risk Factors/Patient Goals at Discharge (Final Review):    ITP Comments:   Comments: Patient arrived for 1st visit/orientation/education at 0800. Patient was referred to CR by Dr. JoMartiniqueue to STEMI involving RCA (I21.11) and status post coronary artery stent placement (Z95.5). During orientation advised patient on arrival and appointment times what to wear, what to do before, during and after exercise. Reviewed attendance  and class policy.  Pt is scheduled  to return Cardiac Rehab on 09/13/2022 at 0815. Pt was advised to come to class 15 minutes before class starts.  Discussed RPE/Dpysnea scales. Patient participated in warm up stretches. Patient was able to complete 6 minute walk test.  Telemetry: NSR. Patient was measured for the equipment. Discussed equipment safety with patient. Took patient pre-anthropometric measurements. Patient finished visit at 0900.

## 2022-09-13 ENCOUNTER — Ambulatory Visit (HOSPITAL_COMMUNITY): Payer: TRICARE For Life (TFL)

## 2022-09-13 ENCOUNTER — Encounter (HOSPITAL_COMMUNITY)
Admission: RE | Admit: 2022-09-13 | Discharge: 2022-09-13 | Disposition: A | Discharge status: Home / Self Care | Payer: Medicare HMO | Source: Ambulatory Visit | Attending: Cardiology | Admitting: Cardiology

## 2022-09-13 DIAGNOSIS — I2111 ST elevation (STEMI) myocardial infarction involving right coronary artery: Secondary | ICD-10-CM | POA: Diagnosis not present

## 2022-09-13 DIAGNOSIS — Z955 Presence of coronary angioplasty implant and graft: Secondary | ICD-10-CM

## 2022-09-13 LAB — GLUCOSE, CAPILLARY: Glucose-Capillary: 113 mg/dL — ABNORMAL HIGH (ref 70–99)

## 2022-09-13 NOTE — Progress Notes (Signed)
Daily Session Note  Patient Details  Name: Katherine Short MRN: [id number] Date of Birth: 04/24/1951 Referring Provider:   Flowsheet Row CARDIAC REHAB PHASE II ORIENTATION from 09/09/2022 in [place]  Referring Provider Dr. Martinique       Encounter Date: 09/13/2022  Check In:  Session Check In - 09/13/22 0813       Check-In   Supervising physician immediately available to respond to emergencies [facility] MD immediately available    Physician(s) Dr.Branch    Location [facility]    Staff Present Geanie Cooley, Edison Simon, MS, ACSM-CEP, Exercise Physiologist;Daphyne Hassell Done, RN, Jennye Moccasin, RN, BSN    Virtual Visit No    Medication changes reported     No    Fall or balance concerns reported    Yes    Comments She has fallen twice this year, with LOC with one fall.    Tobacco Cessation No Change    Warm-up and Cool-down Performed as group-led instruction    Resistance Training Performed Yes    VAD Patient? No    PAD/SET Patient? No      Pain Assessment   Currently in Pain? No/denies    Multiple Pain Sites No             Capillary Blood Glucose: No results found for this or any previous visit (from the past 24 hour(s)).    Social History   Tobacco Use  Smoking Status Never  Smokeless Tobacco Never    Goals Met:  Exercise tolerated well No report of concerns or symptoms today Strength training completed today  Goals Unmet:  Not Applicable  Comments: check out @ 9:15am   Dr. Carlyle Dolly is Medical Director for [facility]

## 2022-09-15 ENCOUNTER — Encounter (HOSPITAL_COMMUNITY)
Admission: RE | Admit: 2022-09-15 | Discharge: 2022-09-15 | Disposition: A | Discharge status: Home / Self Care | Payer: Medicare HMO | Source: Ambulatory Visit | Attending: Cardiology | Admitting: Cardiology

## 2022-09-15 ENCOUNTER — Ambulatory Visit (HOSPITAL_COMMUNITY): Payer: TRICARE For Life (TFL)

## 2022-09-15 DIAGNOSIS — I2111 ST elevation (STEMI) myocardial infarction involving right coronary artery: Secondary | ICD-10-CM | POA: Diagnosis not present

## 2022-09-15 DIAGNOSIS — Z955 Presence of coronary angioplasty implant and graft: Secondary | ICD-10-CM

## 2022-09-15 LAB — GLUCOSE, CAPILLARY: Glucose-Capillary: 120 mg/dL — ABNORMAL HIGH (ref 70–99)

## 2022-09-15 NOTE — Progress Notes (Signed)
Daily Session Note  Patient Details  Name: Katherine Short MRN: [id number] Date of Birth: November 26, 1951 Referring Provider:   Flowsheet Row CARDIAC REHAB PHASE II ORIENTATION from 09/09/2022 in [place]  Referring Provider Dr. Martinique       Encounter Date: 09/15/2022  Check In:  Session Check In - 09/15/22 0809       Check-In   Supervising physician immediately available to respond to emergencies [facility] MD immediately available    Physician(s) Dr Marlou Porch    Location [facility]    Staff Present Gwenneth Whiteman Hassell Done, RN, Bjorn Loser, MS, ACSM-CEP, Exercise Physiologist    Virtual Visit No    Medication changes reported     No    Fall or balance concerns reported    Yes    Comments She has fallen twice this year, with LOC with one fall.    Tobacco Cessation No Change    Warm-up and Cool-down Performed as group-led instruction    Resistance Training Performed Yes    VAD Patient? No    PAD/SET Patient? No      Pain Assessment   Currently in Pain? No/denies    Multiple Pain Sites No             Capillary Blood Glucose: No results found for this or any previous visit (from the past 24 hour(s)).    Social History   Tobacco Use  Smoking Status Never  Smokeless Tobacco Never    Goals Met:  Independence with exercise equipment Exercise tolerated well No report of concerns or symptoms today Strength training completed today  Goals Unmet:  Not Applicable  Comments: Checkout at 0915.   Dr. Carlyle Dolly is Medical Director for [facility]

## 2022-09-17 ENCOUNTER — Ambulatory Visit (HOSPITAL_COMMUNITY): Payer: TRICARE For Life (TFL)

## 2022-09-17 ENCOUNTER — Encounter (HOSPITAL_COMMUNITY)
Admission: RE | Admit: 2022-09-17 | Discharge: 2022-09-17 | Disposition: A | Discharge status: Home / Self Care | Payer: Medicare HMO | Source: Ambulatory Visit | Attending: Cardiology | Admitting: Cardiology

## 2022-09-17 DIAGNOSIS — I2111 ST elevation (STEMI) myocardial infarction involving right coronary artery: Secondary | ICD-10-CM | POA: Diagnosis not present

## 2022-09-17 DIAGNOSIS — Z955 Presence of coronary angioplasty implant and graft: Secondary | ICD-10-CM

## 2022-09-17 LAB — GLUCOSE, CAPILLARY: Glucose-Capillary: 107 mg/dL — ABNORMAL HIGH (ref 70–99)

## 2022-09-17 NOTE — Progress Notes (Signed)
Daily Session Note  Patient Details  Name: Katherine Short MRN: [id number] Date of Birth: [birth year] Referring Provider:   Flowsheet Row CARDIAC REHAB PHASE II ORIENTATION from 09/09/2022 in [place]  Referring Provider Dr. Martinique       Encounter Date: 09/17/2022  Check In:  Session Check In - 09/17/22 0815       Check-In   Supervising physician immediately available to respond to emergencies [facility] MD immediately available    Physician(s) Dr. Harrington Short    Location [facility]    Staff Present Katherine Register, MS, ACSM-CEP, Exercise Physiologist;Katherine Hassell Done, RN, BSN    Virtual Visit No    Medication changes reported     No    Fall or balance concerns reported    Yes    Comments She has fallen twice this year, with LOC with one fall.    Tobacco Cessation No Change    Warm-up and Cool-down Performed as group-led instruction    Resistance Training Performed Yes    VAD Patient? No    PAD/SET Patient? No      Pain Assessment   Currently in Pain? No/denies    Multiple Pain Sites No             Capillary Blood Glucose: Results for orders placed or performed during the hospital encounter of 09/15/22 (from the past 24 hour(s))  Glucose, capillary     Status: Abnormal   Collection Time: 09/17/22  8:03 AM  Result Value Ref Range   Glucose-Capillary 107 (H) 70 - 99 mg/dL      Social History   Tobacco Use  Smoking Status Never  Smokeless Tobacco Never    Goals Met:  Independence with exercise equipment Exercise tolerated well No report of concerns or symptoms today Strength training completed today  Goals Unmet:  Not Applicable  Comments: checkout time is 0915   Dr. Carlyle Short is Medical Director for [place]

## 2022-09-20 ENCOUNTER — Ambulatory Visit (HOSPITAL_COMMUNITY): Payer: TRICARE For Life (TFL)

## 2022-09-20 ENCOUNTER — Encounter (HOSPITAL_COMMUNITY)
Admission: RE | Admit: 2022-09-20 | Discharge: 2022-09-20 | Disposition: A | Discharge status: Home / Self Care | Payer: Medicare HMO | Source: Ambulatory Visit | Attending: Cardiology | Admitting: Cardiology

## 2022-09-20 DIAGNOSIS — I2111 ST elevation (STEMI) myocardial infarction involving right coronary artery: Secondary | ICD-10-CM

## 2022-09-20 DIAGNOSIS — Z955 Presence of coronary angioplasty implant and graft: Secondary | ICD-10-CM

## 2022-09-20 NOTE — Progress Notes (Signed)
Daily Session Note  Patient Details  Name: TEVA BRONKEMA MRN: [id number] Date of Birth: 1951-05-31 Referring Provider:   Flowsheet Row CARDIAC REHAB PHASE II ORIENTATION from 09/09/2022 in [place]  Referring Provider Dr. Martinique       Encounter Date: 09/20/2022  Check In:  Session Check In - 09/20/22 0815       Check-In   Supervising physician immediately available to respond to emergencies [facility] immediately available    Physician(s) Dr Domenic Polite    Location [facility]    Staff Present Luellen Howson Hassell Done, RN, BSN;Heather Otho Ket, BS, Exercise Physiologist    Virtual Visit No    Medication changes reported     No    Fall or balance concerns reported    Yes    Comments She has fallen twice this year, with LOC with one fall.    Tobacco Cessation No Change    Warm-up and Cool-down Performed as group-led instruction    Resistance Training Performed Yes    VAD Patient? No    PAD/SET Patient? No      Pain Assessment   Currently in Pain? No/denies    Multiple Pain Sites No             Capillary Blood Glucose: No results found for this or any previous visit (from the past 24 hour(s)).    Social History   Tobacco Use  Smoking Status Never  Smokeless Tobacco Never    Goals Met:  Independence with exercise equipment Exercise tolerated well No report of concerns or symptoms today Strength training completed today  Goals Unmet:  Not Applicable  Comments: checkout at 0915.   Dr. Carlyle Dolly is Medical Director for [facility]

## 2022-09-22 ENCOUNTER — Ambulatory Visit (HOSPITAL_COMMUNITY): Payer: TRICARE For Life (TFL)

## 2022-09-22 ENCOUNTER — Encounter (HOSPITAL_COMMUNITY)
Admission: RE | Admit: 2022-09-22 | Discharge: 2022-09-22 | Disposition: A | Discharge status: Home / Self Care | Payer: Medicare HMO | Source: Ambulatory Visit | Attending: Cardiology | Admitting: Cardiology

## 2022-09-22 DIAGNOSIS — Z955 Presence of coronary angioplasty implant and graft: Secondary | ICD-10-CM

## 2022-09-22 DIAGNOSIS — I2111 ST elevation (STEMI) myocardial infarction involving right coronary artery: Secondary | ICD-10-CM | POA: Diagnosis not present

## 2022-09-22 NOTE — Progress Notes (Signed)
Daily Session Note  Patient Details  Name: Katherine Short MRN: [id number] Date of Birth: 1951-06-09 Referring Provider:   Flowsheet Row CARDIAC REHAB PHASE II ORIENTATION from 09/09/2022 in [place]  Referring Provider Dr. Martinique       Encounter Date: 09/22/2022  Check In:  Session Check In - 09/22/22 0815       Check-In   Supervising physician immediately available to respond to emergencies [facility] immediately available    Physician(s) Dr. Marlou Porch    Location [facility]    Staff Present Hoy Register, MS, ACSM-CEP, Exercise Physiologist;Heather Zigmund Daniel, Exercise Physiologist    Virtual Visit No    Medication changes reported     No    Fall or balance concerns reported    Yes    Comments She has fallen twice this year, with LOC with one fall.    Tobacco Cessation No Change    Warm-up and Cool-down Performed as group-led instruction    Resistance Training Performed Yes    VAD Patient? No    PAD/SET Patient? No      Pain Assessment   Currently in Pain? No/denies    Multiple Pain Sites No             Capillary Blood Glucose: No results found for this or any previous visit (from the past 24 hour(s)).    Social History   Tobacco Use  Smoking Status Never  Smokeless Tobacco Never    Goals Met:  Independence with exercise equipment Exercise tolerated well No report of concerns or symptoms today Strength training completed today  Goals Unmet:  Not Applicable  Comments: checkout time is 0915   Dr. Carlyle Dolly is Medical Director for [place]

## 2022-09-24 ENCOUNTER — Encounter (HOSPITAL_COMMUNITY)
Admission: RE | Admit: 2022-09-24 | Discharge: 2022-09-24 | Disposition: A | Discharge status: Home / Self Care | Payer: Medicare HMO | Source: Ambulatory Visit | Attending: Cardiology | Admitting: Cardiology

## 2022-09-24 ENCOUNTER — Ambulatory Visit (HOSPITAL_COMMUNITY): Payer: TRICARE For Life (TFL)

## 2022-09-24 DIAGNOSIS — Z955 Presence of coronary angioplasty implant and graft: Secondary | ICD-10-CM

## 2022-09-24 DIAGNOSIS — I2111 ST elevation (STEMI) myocardial infarction involving right coronary artery: Secondary | ICD-10-CM | POA: Diagnosis not present

## 2022-09-24 NOTE — Progress Notes (Signed)
Daily Session Note  Patient Details  Name: SHARIYAH ELAND MRN: [id number] Date of Birth: [birth year] Referring Provider:   Flowsheet Row CARDIAC REHAB PHASE II ORIENTATION from 09/09/2022 in [place]  Referring Provider Dr. Martinique       Encounter Date: 09/24/2022  Check In:  Session Check In - 09/24/22 0811       Check-In   Supervising physician immediately available to respond to emergencies [facility] immediately available    Physician(s) Dr. Gasper Sells    Location [facility]    Staff Present Redge Gainer, BS, Exercise Physiologist;Dalton Kris Mouton, MS, ACSM-CEP, Exercise Physiologist    Virtual Visit No    Medication changes reported     No    Fall or balance concerns reported    Yes    Comments She has fallen twice this year, with LOC with one fall.    Tobacco Cessation No Change    Warm-up and Cool-down Performed as group-led instruction    Resistance Training Performed Yes    VAD Patient? No    PAD/SET Patient? No      Pain Assessment   Currently in Pain? No/denies    Multiple Pain Sites No             Capillary Blood Glucose: No results found for this or any previous visit (from the past 24 hour(s)).    Social History   Tobacco Use  Smoking Status Never  Smokeless Tobacco Never    Goals Met:  Independence with exercise equipment Exercise tolerated well No report of concerns or symptoms today Strength training completed today  Goals Unmet:  Not Applicable  Comments: check out 0915   Dr. Carlyle Dolly is Medical Director for [place]

## 2022-09-27 ENCOUNTER — Ambulatory Visit (HOSPITAL_COMMUNITY): Payer: TRICARE For Life (TFL)

## 2022-09-27 ENCOUNTER — Encounter (HOSPITAL_COMMUNITY)
Admission: RE | Admit: 2022-09-27 | Discharge: 2022-09-27 | Disposition: A | Discharge status: Home / Self Care | Payer: Medicare HMO | Source: Ambulatory Visit | Attending: Cardiology | Admitting: Cardiology

## 2022-09-27 DIAGNOSIS — I2111 ST elevation (STEMI) myocardial infarction involving right coronary artery: Secondary | ICD-10-CM | POA: Insufficient documentation

## 2022-09-27 DIAGNOSIS — Z955 Presence of coronary angioplasty implant and graft: Secondary | ICD-10-CM | POA: Insufficient documentation

## 2022-09-27 NOTE — Progress Notes (Signed)
Daily Session Note  Patient Details  Name: NAYAH LUKENS MRN: [id number] Date of Birth: 1951-06-01 Referring Provider:   Flowsheet Row CARDIAC REHAB PHASE II ORIENTATION from 09/09/2022 in [place]  Referring Provider Dr. Martinique       Encounter Date: 09/27/2022  Check In:  Session Check In - 09/27/22 0815       Check-In   Supervising physician immediately available to respond to emergencies [facility] immediately available    Physician(s) Dr. Phineas Inches    Location [facility]    Staff Present Redge Gainer, BS, Exercise Physiologist;Dalton Kris Mouton, MS, ACSM-CEP, Exercise Physiologist    Virtual Visit No    Medication changes reported     No    Fall or balance concerns reported    Yes    Comments She has fallen twice this year, with LOC with one fall.    Tobacco Cessation No Change    Warm-up and Cool-down Performed as group-led instruction    Resistance Training Performed Yes    VAD Patient? No    PAD/SET Patient? No      Pain Assessment   Currently in Pain? No/denies    Multiple Pain Sites No             Capillary Blood Glucose: No results found for this or any previous visit (from the past 24 hour(s)).    Social History   Tobacco Use  Smoking Status Never  Smokeless Tobacco Never    Goals Met:  Independence with exercise equipment Exercise tolerated well No report of concerns or symptoms today Strength training completed today  Goals Unmet:  Not Applicable  Comments: check out 0915   Dr. Carlyle Dolly is Medical Director for [place]

## 2022-09-29 ENCOUNTER — Ambulatory Visit (HOSPITAL_COMMUNITY): Payer: TRICARE For Life (TFL)

## 2022-09-29 ENCOUNTER — Encounter (HOSPITAL_COMMUNITY)
Admission: RE | Admit: 2022-09-29 | Discharge: 2022-09-29 | Disposition: A | Discharge status: Home / Self Care | Payer: Medicare HMO | Source: Ambulatory Visit | Attending: Cardiology | Admitting: Cardiology

## 2022-09-29 DIAGNOSIS — I2111 ST elevation (STEMI) myocardial infarction involving right coronary artery: Secondary | ICD-10-CM | POA: Diagnosis not present

## 2022-09-29 DIAGNOSIS — Z955 Presence of coronary angioplasty implant and graft: Secondary | ICD-10-CM

## 2022-09-29 NOTE — Progress Notes (Signed)
Daily Session Note  Patient Details  Name: Katherine Short MRN: [id number] Date of Birth: [birth year] Referring Provider:   Flowsheet Row CARDIAC REHAB PHASE II ORIENTATION from 09/09/2022 in [place]  Referring Provider Dr. Martinique       Encounter Date: 09/29/2022  Check In:  Session Check In - 09/29/22 0813       Check-In   Supervising physician immediately available to respond to emergencies [facility] immediately available    Physician(s) Dr. Harl Bowie    Location [facility]    Staff Present Redge Gainer, BS, Exercise Physiologist;Dalton Kris Mouton, MS, ACSM-CEP, Exercise Physiologist;Daphyne Hassell Done, RN, BSN    Virtual Visit No    Medication changes reported     No    Fall or balance concerns reported    Yes    Comments She has fallen twice this year, with LOC with one fall.    Tobacco Cessation No Change    Warm-up and Cool-down Performed as group-led instruction    Resistance Training Performed Yes    VAD Patient? No    PAD/SET Patient? No      Pain Assessment   Currently in Pain? No/denies    Multiple Pain Sites No             Capillary Blood Glucose: No results found for this or any previous visit (from the past 24 hour(s)).    Social History   Tobacco Use  Smoking Status Never  Smokeless Tobacco Never    Goals Met:  Independence with exercise equipment Exercise tolerated well No report of concerns or symptoms today Strength training completed today  Goals Unmet:  Not Applicable  Comments: check out 0915   Dr. Carlyle Dolly is Medical Director for [place]

## 2022-10-01 ENCOUNTER — Ambulatory Visit (HOSPITAL_COMMUNITY): Payer: TRICARE For Life (TFL)

## 2022-10-01 ENCOUNTER — Encounter (HOSPITAL_COMMUNITY)
Admission: RE | Admit: 2022-10-01 | Discharge: 2022-10-01 | Disposition: A | Discharge status: Home / Self Care | Payer: Medicare HMO | Source: Ambulatory Visit | Attending: Cardiology | Admitting: Cardiology

## 2022-10-01 DIAGNOSIS — I2111 ST elevation (STEMI) myocardial infarction involving right coronary artery: Secondary | ICD-10-CM | POA: Diagnosis not present

## 2022-10-01 DIAGNOSIS — Z955 Presence of coronary angioplasty implant and graft: Secondary | ICD-10-CM

## 2022-10-01 NOTE — Progress Notes (Signed)
Daily Session Note  Patient Details  Name: Katherine Short MRN: [id number] Date of Birth: 08/23/1951 Referring Provider:   Flowsheet Row CARDIAC REHAB PHASE II ORIENTATION from 09/09/2022 in [place] [facility]  Referring Provider Dr. Jordan       Encounter Date: 10/01/2022  Check In:  Session Check In - 10/01/22 0815       Check-In   Supervising physician immediately available to respond to emergencies [facility] immediately available    Physician(s) Dr. Ross    Location [facility]    Staff Present Heather Jachimiak, BS, Exercise Physiologist;Dalton Fletcher, MS, ACSM-CEP, Exercise Physiologist;Daphyne Martin, RN, BSN    Virtual Visit No    Medication changes reported     No    Fall or balance concerns reported    Yes    Comments She has fallen twice this year, with LOC with one fall.    Tobacco Cessation No Change    Warm-up and Cool-down Performed as group-led instruction    Resistance Training Performed Yes    VAD Patient? No    PAD/SET Patient? No      Pain Assessment   Currently in Pain? No/denies    Multiple Pain Sites No             Capillary Blood Glucose: No results found for this or any previous visit (from the past 24 hour(s)).    Social History   Tobacco Use  Smoking Status Never  Smokeless Tobacco Never    Goals Met:  Independence with exercise equipment Exercise tolerated well No report of concerns or symptoms today Strength training completed today  Goals Unmet:  Not Applicable  Comments: check out 0915   Dr. Jonathan Branch is Medical Director for [place] 

## 2022-10-02 LAB — LIPID PANEL
Chol/HDL Ratio: 3 ratio (ref 0.0–4.4)
Cholesterol, Total: 116 mg/dL (ref 100–199)
HDL: 39 mg/dL — ABNORMAL LOW (ref >39)
LDL Chol Calc (NIH): 54 mg/dL (ref 0–99)
Triglycerides: 127 mg/dL (ref 0–149)
VLDL Cholesterol Cal: 23 mg/dL (ref 5–40)

## 2022-10-02 LAB — HEPATIC FUNCTION PANEL
ALT: 40 IU/L — ABNORMAL HIGH (ref 0–32)
AST: 36 IU/L (ref 0–40)
Albumin: 4.8 g/dL (ref 3.8–4.8)
Alkaline Phosphatase: 90 IU/L (ref 44–121)
Bilirubin Total: 0.5 mg/dL (ref 0.0–1.2)
Bilirubin, Direct: 0.14 mg/dL (ref 0.00–0.40)
Total Protein: 7.4 g/dL (ref 6.0–8.5)

## 2022-10-04 ENCOUNTER — Encounter (HOSPITAL_COMMUNITY): Payer: Medicare HMO

## 2022-10-04 ENCOUNTER — Ambulatory Visit (HOSPITAL_COMMUNITY): Payer: TRICARE For Life (TFL)

## 2022-10-04 ENCOUNTER — Ambulatory Visit (INDEPENDENT_AMBULATORY_CARE_PROVIDER_SITE_OTHER): Payer: Medicare HMO | Admitting: Neurology

## 2022-10-04 ENCOUNTER — Encounter: Payer: Self-pay | Admitting: Neurology

## 2022-10-04 VITALS — BP 123/75 | HR 69 | Ht 64.0 in | Wt 179.0 lb

## 2022-10-04 DIAGNOSIS — Z8673 Personal history of transient ischemic attack (TIA), and cerebral infarction without residual deficits: Secondary | ICD-10-CM | POA: Diagnosis not present

## 2022-10-04 DIAGNOSIS — I213 ST elevation (STEMI) myocardial infarction of unspecified site: Secondary | ICD-10-CM

## 2022-10-04 DIAGNOSIS — R0683 Snoring: Secondary | ICD-10-CM

## 2022-10-04 DIAGNOSIS — R5383 Other fatigue: Secondary | ICD-10-CM | POA: Diagnosis not present

## 2022-10-04 DIAGNOSIS — E669 Obesity, unspecified: Secondary | ICD-10-CM

## 2022-10-04 DIAGNOSIS — R351 Nocturia: Secondary | ICD-10-CM

## 2022-10-04 NOTE — Patient Instructions (Signed)
Thank you for choosing [facility] for your sleep related care! It was nice to meet you today!   Here is what we discussed today:   You can try Melatonin at night for sleep: take 1 mg one to 2 hours before your bedtime. You can go gradually increase to 2 mg or 3 mg if needed. It is over the counter and comes in pill form, chewable form and spray, if you prefer.      Based on your symptoms and your exam I believe you are at risk for obstructive sleep apnea (aka OSA). We should proceed with a sleep study to determine whether you do or do not have OSA and how severe it is. Even, if you have mild OSA, I may want you to consider treatment with CPAP, as treatment of even borderline or mild sleep apnea can result and improvement of symptoms such as sleep disruption, daytime sleepiness, nighttime bathroom breaks, restless leg symptoms, improvement of headache syndromes, even improved mood disorder.   As explained, an attended sleep study (meaning you get to stay overnight in the sleep lab), lets [place] monitor sleep-related behaviors such as sleep talking and leg movements in sleep, in addition to monitoring for sleep apnea.  A home sleep test is a screening tool for sleep apnea diagnosis only, but unfortunately, does not help with any other sleep-related diagnoses.  Please remember, the long-term risks and ramifications of untreated moderate to severe obstructive sleep apnea may include (but are not limited to): increased risk for cardiovascular disease, including congestive heart failure, stroke, difficult to control hypertension, treatment resistant obesity, arrhythmias, especially irregular heartbeat commonly known as A. Fib. (atrial fibrillation); even type 2 diabetes has been linked to untreated OSA.   Other correlations that untreated obstructive sleep apnea include macular edema which is swelling of the retina in the eyes, droopy eyelid syndrome, and elevated hemoglobin and hematocrit  levels (often referred to as polycythemia).  Sleep apnea can cause disruption of sleep and sleep deprivation in most cases, which, in turn, can cause recurrent headaches, problems with memory, mood, concentration, focus, and vigilance. Most people with untreated sleep apnea report excessive daytime sleepiness, which can affect their ability to drive. Please do not drive or use heavy equipment or machinery, if you feel sleepy! Patients with sleep apnea can also develop difficulty initiating and maintaining sleep (aka insomnia).   Having sleep apnea may increase your risk for other sleep disorders, including involuntary behaviors sleep such as sleep terrors, sleep talking, sleepwalking.    Having sleep apnea can also increase your risk for restless leg syndrome and leg movements at night.   Please note that untreated obstructive sleep apnea may carry additional perioperative morbidity. Patients with significant obstructive sleep apnea (typically, in the moderate to severe degree) should receive, if possible, perioperative PAP (positive airway pressure) therapy and the surgeons and particularly the anesthesiologists should be informed of the diagnosis and the severity of the sleep disordered breathing.   We will call you or email you through [place] with regards to your test results and plan a follow-up in sleep clinic accordingly. Most likely, you will hear from one of our nurses.   Our sleep lab administrative assistant will call you to schedule your sleep study and give you further instructions, regarding the check in process for the sleep study, arrival time, what to bring, when you can expect to leave after the study, etc., and to answer any other logistical questions you may have. If you  don't hear back from her by about 2 weeks from now, please feel free to call her direct line at [phone] or you can call our general clinic number, or email [place] through My Chart.

## 2022-10-04 NOTE — Progress Notes (Signed)
Subjective:    Patient ID: Katherine Short is a 71 y.o. female.  HPI    [name], MD, PhD [facility] [address]O. [place], [place] [address]  Dear Aliene Beams,   I saw your patient, Katherine Short, upon your kind request in my sleep clinic today for initial consultation of her sleep disorder, in particular, concern for underlying obstructive sleep apnea.  The patient is unaccompanied today.  As you know, Katherine Short is a 71 year old female with an underlying medical history of hypertension, hyperlipidemia, coronary artery disease with history of STEMI, stroke in August 2023, headaches, reflux disease, anxiety, and borderline obesity, who reports snoring and nonrestorative sleep, feeling tired during the day, difficulty initiating and maintaining sleep for the past few months, particularly after her stroke and heart attack in August 2023.  She has not tried any medication for this.  I reviewed your office note from 08/24/2022.  Her Epworth sleepiness score is 6 out of 24, fatigue severity score is 43 out of 63.  She has no family history of sleep apnea.  She wakes up to go to the bathroom multiple times a night, and average of 3-4 times.  She denies recurrent nocturnal or morning headaches.  Her husband has sleep apnea and uses a CPAP, he sleeps in a separate bedroom from her.  She lives with her husband, they have 1 dog in the household, she does have a TV in her bedroom but tries to turn it off before falling asleep.  Bedtime could be as early as 7:30 AM and no later than 10 PM.  Rise time currently is around 6:30 AM as she is in cardiac rehab in a [facility].  She has been using a dentist made occlusive guard for bruxism at night.  She had a recent cardiac monitor.  She has a follow-up appointment with her cardiologist in early November.  She is retired from [employer], she works for the tax office.  She does not drink any  caffeine, she is a non-smoker, tried smoking in her teenage years.  She currently does not consume any alcohol.  She had recent blood work and was worried about her mild ALT increase, we talked about it today.  She was largely reassured.  Her Past Medical History Is Significant For: Past Medical History:  Diagnosis Date   Anxiety    Chest pain 03/19/2010   nuclear study was negative   Depression    Dyslipidemia    GERD (gastroesophageal reflux disease)    Headache(784.0)    Hypertension    Obstructive sleep apnea 08/24/2022   STEMI (ST elevation myocardial infarction) ([place])    Stroke [facility])     Her Past Surgical History Is Significant For: Past Surgical History:  Procedure Laterality Date   BREAST LUMPECTOMY WITH RADIOACTIVE SEED LOCALIZATION Left 07/07/2017   Procedure: LEFT BREAST LUMPECTOMY WITH RADIOACTIVE SEED LOCALIZATION;  Surgeon: Jovita Kussmaul, MD;  Location: [place];  Service: General;  Laterality: Left;   BREAST LUMPECTOMY WITH RADIOACTIVE SEED LOCALIZATION Left 07/18/2017   Procedure: LEFT BREAST LUMPECTOMY WITH RADIOACTIVE SEED LOCALIZATION;  Surgeon: Jovita Kussmaul, MD;  Location: [place];  Service: General;  Laterality: Left;   CATARACT EXTRACTION W/PHACO  11/22/2011   Procedure: CATARACT EXTRACTION PHACO AND INTRAOCULAR LENS PLACEMENT ([place]);  Surgeon: Tonny Branch;  Location: [facility];  Service: Ophthalmology;  Laterality: Left;  CDE:7.76   CATARACT  EXTRACTION W/PHACO  12/02/2011   Procedure: CATARACT EXTRACTION PHACO AND INTRAOCULAR LENS PLACEMENT ([facility]);  Surgeon: Tonny Branch;  Location: [facility];  Service: Ophthalmology;  Laterality: Right;  CDE=11.92   COLONOSCOPY     CORONARY/GRAFT ACUTE MI REVASCULARIZATION N/A 08/08/2022   Procedure: Coronary/Graft Acute MI Revascularization;  Surgeon: Martinique, Peter M, MD;  Location: [place] [facility];  Service: Cardiovascular;  Laterality: N/A;   LEFT HEART CATH AND CORONARY ANGIOGRAPHY N/A 08/08/2022   Procedure: LEFT  HEART CATH AND CORONARY ANGIOGRAPHY;  Surgeon: Martinique, Peter M, MD;  Location: [place] [facility];  Service: Cardiovascular;  Laterality: N/A;   TUBAL LIGATION      Her Family History Is Significant For: Family History  Problem Relation Age of Onset   Cancer Mother 16       breast   Diabetes Father    Thyroid disease Sister    Thyroid disease Sister    Thyroid disease Sister    Hypertension Brother     Her Social History Is Significant For: Social History   Socioeconomic History   Marital status: Married    Spouse name: Not on file   Number of children: Not on file   Years of education: Not on file   Highest education level: Not on file  Occupational History   Not on file  Tobacco Use   Smoking status: Never   Smokeless tobacco: Never  Vaping Use   Vaping Use: Never used  Substance and Sexual Activity   Alcohol use: No   Drug use: No   Sexual activity: Not on file  Other Topics Concern   Not on file  Social History Narrative   Caffiene sprite occasional. (Or Sprite zero).    Education HS grad   Retired:  Research officer, trade [employer].    Social Determinants of Health   Financial Resource Strain: Not on file  Food Insecurity: Not on file  Transportation Needs: Not on file  Physical Activity: Not on file  Stress: Not on file  Social Connections: Not on file    Her Allergies Are:  Allergies  Allergen Reactions   Penicillins Itching and Rash       :   Her Current Medications Are:  Outpatient Encounter Medications as of 10/04/2022  Medication Sig   acetaminophen (TYLENOL) 500 MG tablet Take 500 mg by mouth every 6 (six) hours as needed for moderate pain.   acyclovir (ZOVIRAX) 200 MG capsule Take 200 mg by mouth 2 (two) times daily.    aspirin EC 81 MG tablet Take 1 tablet (81 mg total) by mouth daily. Swallow whole.   gabapentin (NEURONTIN) 300 MG capsule Take 300 mg by mouth 3 (three) times daily.   meclizine (ANTIVERT) 25 MG tablet Take 1 tablet (25 mg total) by  mouth 4 (four) times daily as needed for dizziness. (Patient taking differently: Take 25 mg by mouth 3 (three) times daily as needed for dizziness.)   metoprolol tartrate (LOPRESSOR) 25 MG tablet Take 1/2 tablet (12.5 mg total) by mouth 2 (two) times daily.   nitroGLYCERIN (NITROSTAT) 0.4 MG SL tablet Place 1 tablet (0.4 mg total) under the tongue every 5 (five) minutes as needed for chest pain.   OZEMPIC, 1 MG/DOSE, 4 MG/3ML SOPN Inject 1 mg into the skin once a week.   pantoprazole (PROTONIX) 40 MG tablet Take 40 mg by mouth daily.   rosuvastatin (CRESTOR) 40 MG tablet Take 1 tablet (40 mg total) by mouth daily.   ticagrelor (BRILINTA)  90 MG TABS tablet Take 1 tablet (90 mg total) by mouth 2 (two) times daily.   No facility-administered encounter medications on file as of 10/04/2022.  :   Review of Systems:  Out of a complete 14 point review of systems, all are reviewed and negative with the exception of these symptoms as listed below:  Review of Systems  Neurological:        R/O OSA. ESS  6. FSS 43.  Heart attack 08-08-2022. Was taking clonazepam but now nothing.  Cannot sleep.  Fragmented sleep. Had stroke.     Objective:  Neurological Exam  Physical Exam Physical Examination:   Vitals:   10/04/22 0913  BP: 123/75  Pulse: 69    General Examination: The patient is a very pleasant 71 y.o. female in no acute distress. She appears well-developed and well-nourished and well groomed.   HEENT: Normocephalic, atraumatic, pupils are equal, round and reactive to light, extraocular tracking is good without limitation to gaze excursion or nystagmus noted. Hearing is grossly intact. Face is symmetric with normal facial animation. Speech is clear with no dysarthria noted. There is no hypophonia. There is no lip, neck/head, jaw or voice tremor. Neck is supple with full range of passive and active motion. There are no carotid bruits on auscultation. Oropharynx exam reveals: mild mouth dryness,  adequate dental hygiene and mild airway crowding, due to redundant soft palate, tonsils on the smaller side, Mallampati class II, neck circumference of 15-1/4 inches.  Tongue protrudes centrally and palate elevates symmetrically, mild overbite.  Chest: Clear to auscultation without wheezing, rhonchi or crackles noted.  Heart: S1+S2+0, regular and normal without murmurs, rubs or gallops noted.   Abdomen: Soft, non-tender and non-distended.  Extremities: There is no pitting edema in the distal lower extremities bilaterally.   Skin: Warm and dry without trophic changes noted.   Musculoskeletal: exam reveals no obvious joint deformities.   Neurologically:  Mental status: The patient is awake, alert and oriented in all 4 spheres. Her immediate and remote memory, attention, language skills and fund of knowledge are appropriate. There is no evidence of aphasia, agnosia, apraxia or anomia. Speech is clear with normal prosody and enunciation. Thought process is linear. Mood is normal and affect is normal.  Cranial nerves II - XII are as described above under HEENT exam.  Motor exam: Normal bulk, strength and tone is noted. There is no obvious action or resting tremor.  Fine motor skills and coordination: grossly intact.  Cerebellar testing: No dysmetria or intention tremor. There is no truncal or gait ataxia.  Sensory exam: intact to light touch in the upper and lower extremities.  Gait, station and balance: She stands easily. No veering to one side is noted. No leaning to one side is noted. Posture is age-appropriate and stance is narrow based. Gait shows normal stride length and normal pace. No problems turning are noted.   Assessment and Plan:   In summary, Katherine Short is a very pleasant 71 y.o.-year old female with an underlying medical history of hypertension, hyperlipidemia, coronary artery disease with history of STEMI, stroke in August 2023, headaches, reflux disease, anxiety, and  borderline obesity, whose history and physical exam concerning for sleep disordered breathing, in particular, obstructive sleep apnea (OSA). A laboratory attended sleep study is considered gold standard for evaluation of sleep disordered breathing and is recommended at this time and clinically justified.   I had a long chat with the patient about my findings and the diagnosis of  sleep apnea, particularly OSA, its prognosis and treatment options. We talked about medical/conservative treatments, surgical interventions and non-pharmacological approaches for symptom control. I explained, in particular, the risks and ramifications of untreated moderate to severe OSA, especially with respect to developing cardiovascular disease down the road, including congestive heart failure (CHF), difficult to treat hypertension, cardiac arrhythmias (particularly A-fib), neurovascular complications including TIA, stroke and dementia. Even type 2 diabetes has, in part, been linked to untreated OSA. Symptoms of untreated OSA may include (but may not be limited to) daytime sleepiness, nocturia (i.e. frequent nighttime urination), memory problems, mood irritability and suboptimally controlled or worsening mood disorder such as depression and/or anxiety, lack of energy, lack of motivation, physical discomfort, as well as recurrent headaches, especially morning or nocturnal headaches. We talked about the importance of maintaining a healthy lifestyle and striving for healthy weight. In addition, we talked about the importance of striving for and maintaining good sleep hygiene. I recommended the following at this time: sleep study.  I outlined the differences between a laboratory attended sleep study which is considered more comprehensive and accurate over the option of a home sleep test (HST); the latter may lead to underestimation of sleep disordered breathing in some instances and does not help with diagnosing upper airway resistance  syndrome and is not accurate enough to diagnose primary central sleep apnea typically. I explained the different sleep test procedures to the patient in detail and also outlined possible surgical and non-surgical treatment options of OSA, including the use of a pressure airway pressure (PAP) device (ie CPAP, AutoPAP/APAP or BiPAP in certain circumstances), a custom-made dental device (aka oral appliance, which would require a referral to a specialist dentist or orthodontist typically, and is generally speaking not considered a good choice for patients with full dentures or edentulous state), upper airway surgical options, such as traditional UPPP (which is not considered a first-line treatment) or the Inspire device (hypoglossal nerve stimulator, which would involve a referral for consultation with an ENT surgeon, after careful selection, following inclusion criteria). I explained the PAP treatment option to the patient in detail, as this is generally considered first-line treatment.  The patient indicated that she would be reluctant but willing to try PAP therapy, if the need arises. I explained the importance of being compliant with PAP treatment, not only for insurance purposes but primarily to improve patient's symptoms symptoms, and for the patient's long term health benefit, including to reduce Her cardiovascular risks longer-term.    We will pick up our discussion about the next steps and treatment options after testing.  We will keep her posted as to the test results by phone call and/or MyChart messaging where possible.  We will plan to follow-up in sleep clinic accordingly as well.  I answered all her questions today and the patient was in agreement.   I encouraged her to call with any interim questions, concerns, problems or updates or email [place] through [facility].  Generally speaking, sleep test authorizations may take up to 2 weeks, sometimes less, sometimes longer, the patient is encouraged to get in  touch with [place] if they do not hear back from the sleep lab staff directly within the next 2 weeks.  Thank you very much for allowing me to participate in the care of this nice patient. If I can be of any further assistance to you please do not hesitate to talk to me.    Sincerely,   [name], MD, PhD

## 2022-10-06 ENCOUNTER — Encounter (HOSPITAL_COMMUNITY)
Admission: RE | Admit: 2022-10-06 | Discharge: 2022-10-06 | Disposition: A | Discharge status: Home / Self Care | Payer: Medicare HMO | Source: Ambulatory Visit | Attending: Cardiology | Admitting: Cardiology

## 2022-10-06 ENCOUNTER — Ambulatory Visit (HOSPITAL_COMMUNITY): Payer: TRICARE For Life (TFL)

## 2022-10-06 DIAGNOSIS — I2111 ST elevation (STEMI) myocardial infarction involving right coronary artery: Secondary | ICD-10-CM | POA: Diagnosis not present

## 2022-10-06 DIAGNOSIS — Z955 Presence of coronary angioplasty implant and graft: Secondary | ICD-10-CM

## 2022-10-06 NOTE — Progress Notes (Signed)
Daily Session Note  Patient Details  Name: Katherine Short MRN: [id number] Date of Birth: [birth year] Referring Provider:   Flowsheet Row CARDIAC REHAB PHASE II ORIENTATION from 09/09/2022 in [place]  Referring Provider Dr. Martinique       Encounter Date: 10/06/2022  Check In:  Session Check In - 10/06/22 0814       Check-In   Supervising physician immediately available to respond to emergencies [facility] MD immediately available    Physician(s) dr  hilty    Location [facility]    Staff Present Hoy Register, MS, ACSM-CEP, Exercise Physiologist;Heather Zigmund Daniel, Exercise Physiologist;Other    Virtual Visit No    Medication changes reported     No    Fall or balance concerns reported    Yes    Comments She has fallen twice this year, with LOC with one fall.    Tobacco Cessation No Change    Warm-up and Cool-down Performed as group-led instruction    Resistance Training Performed Yes    VAD Patient? No    PAD/SET Patient? No      Pain Assessment   Currently in Pain? No/denies    Multiple Pain Sites No             Capillary Blood Glucose: No results found for this or any previous visit (from the past 24 hour(s)).    Social History   Tobacco Use  Smoking Status Never  Smokeless Tobacco Never    Goals Met:  Independence with exercise equipment Exercise tolerated well No report of concerns or symptoms today Strength training completed today  Goals Unmet:  Not Applicable  Comments: check out at 9:15   Dr. Carlyle Dolly is Medical Director for [facility]

## 2022-10-06 NOTE — Progress Notes (Signed)
Cardiac Individual Treatment Plan  Patient Details  Name: Katherine Short MRN: [id number] Date of Birth: May 23, 1951 Referring Provider:   Flowsheet Row CARDIAC REHAB PHASE II ORIENTATION from 09/09/2022 in [place]  Referring Provider Dr. Martinique       Initial Encounter Date:  Flowsheet Row CARDIAC REHAB PHASE II ORIENTATION from 09/09/2022 in [place]  Date 09/09/22       Visit Diagnosis: Status post coronary artery stent placement  ST elevation myocardial infarction involving right coronary artery (HCC)  Patient's Home Medications on Admission:  Current Outpatient Medications:    acetaminophen (TYLENOL) 500 MG tablet, Take 500 mg by mouth every 6 (six) hours as needed for moderate pain., Disp: , Rfl:    acyclovir (ZOVIRAX) 200 MG capsule, Take 200 mg by mouth 2 (two) times daily. , Disp: , Rfl:    aspirin EC 81 MG tablet, Take 1 tablet (81 mg total) by mouth daily. Swallow whole., Disp: 90 tablet, Rfl: 3   gabapentin (NEURONTIN) 300 MG capsule, Take 300 mg by mouth 3 (three) times daily., Disp: , Rfl: 1   meclizine (ANTIVERT) 25 MG tablet, Take 1 tablet (25 mg total) by mouth 4 (four) times daily as needed for dizziness. (Patient taking differently: Take 25 mg by mouth 3 (three) times daily as needed for dizziness.), Disp: 40 tablet, Rfl: 0   metoprolol tartrate (LOPRESSOR) 25 MG tablet, Take 1/2 tablet (12.5 mg total) by mouth 2 (two) times daily., Disp: 90 tablet, Rfl: 3   nitroGLYCERIN (NITROSTAT) 0.4 MG SL tablet, Place 1 tablet (0.4 mg total) under the tongue every 5 (five) minutes as needed for chest pain., Disp: 25 tablet, Rfl: 2   OZEMPIC, 1 MG/DOSE, 4 MG/3ML SOPN, Inject 1 mg into the skin once a week., Disp: , Rfl:    pantoprazole (PROTONIX) 40 MG tablet, Take 40 mg by mouth daily., Disp: , Rfl:    rosuvastatin (CRESTOR) 40 MG tablet, Take 1 tablet (40 mg total) by mouth daily., Disp: 90 tablet, Rfl: 3   ticagrelor  (BRILINTA) 90 MG TABS tablet, Take 1 tablet (90 mg total) by mouth 2 (two) times daily., Disp: 180 tablet, Rfl: 2  Past Medical History: Past Medical History:  Diagnosis Date   Anxiety    Chest pain 03/19/2010   nuclear study was negative   Depression    Dyslipidemia    GERD (gastroesophageal reflux disease)    Headache(784.0)    Hypertension    Obstructive sleep apnea 08/24/2022   STEMI (ST elevation myocardial infarction) ([place])    Stroke ([place])     Tobacco Use: Social History   Tobacco Use  Smoking Status Never  Smokeless Tobacco Never    Labs: Review Flowsheet       Latest Ref Rng & Units 05/25/2013 08/14/2014 08/08/2022 10/01/2022  Labs for ITP Cardiac and Pulmonary Rehab  Cholestrol 100 - 199 mg/dL 166  180  133  116   LDL (calc) 0 - 99 mg/dL 104  107  56  54   HDL-C >39 mg/dL 47  52  39  39   Trlycerides 0 - 149 mg/dL 76  106  188  127   Hemoglobin A1c 4.8 - 5.6 % - - 5.7  -    Capillary Blood Glucose: Lab Results  Component Value Date   GLUCAP 107 (H) 09/17/2022   GLUCAP 120 (H) 09/15/2022   GLUCAP 113 (H) 09/13/2022   GLUCAP 138 (H) 09/09/2022   GLUCAP 144 (  H) 08/09/2022    POCT Glucose     Row Name 09/09/22 0833             POCT Blood Glucose   Pre-Exercise 138 mg/dL                Exercise Target Goals: Exercise Program Goal: Individual exercise prescription set using results from initial 6 min walk test and THRR while considering  patient's activity barriers and safety.   Exercise Prescription Goal: Starting with aerobic activity 30 plus minutes a day, 3 days per week for initial exercise prescription. Provide home exercise prescription and guidelines that participant acknowledges understanding prior to discharge.  Activity Barriers & Risk Stratification:  Activity Barriers & Cardiac Risk Stratification - 09/09/22 0854       Activity Barriers & Cardiac Risk Stratification   Activity Barriers Arthritis;Neck/Spine Problems;Balance  Concerns;History of Falls    Cardiac Risk Stratification High             6 Minute Walk:  6 Minute Walk     Row Name 09/09/22 0959         6 Minute Walk   Phase Initial     Distance 1400 feet     Walk Time 6 minutes     # of Rest Breaks 0     MPH 2.65     METS 2.71     RPE 11     VO2 Peak 9.49     Symptoms No     Resting HR 79 bpm     Resting BP 116/80     Resting Oxygen Saturation  95 %     Exercise Oxygen Saturation  during 6 min walk 98 %     Max Ex. HR 95 bpm     Max Ex. BP 130/70     2 Minute Post BP 114/70              Oxygen Initial Assessment:   Oxygen Re-Evaluation:   Oxygen Discharge (Final Oxygen Re-Evaluation):   Initial Exercise Prescription:  Initial Exercise Prescription - 09/09/22 1000       Date of Initial Exercise RX and Referring Provider   Date 09/09/22    Referring Provider Dr. Martinique    Expected Discharge Date 12/03/22      Treadmill   MPH 1.7    Grade 0    Minutes 17      NuStep   Level 1    SPM 60    Minutes 22      Prescription Details   Frequency (times per week) 3    Duration Progress to 30 minutes of continuous aerobic without signs/symptoms of physical distress      Intensity   THRR 40-80% of Max Heartrate 60-119    Ratings of Perceived Exertion 11-13      Resistance Training   Training Prescription Yes    Weight 3    Reps 10-15             Perform Capillary Blood Glucose checks as needed.  Exercise Prescription Changes:   Exercise Prescription Changes     Row Name 09/22/22 1000 10/01/22 0815           Response to Exercise   Blood Pressure (Admit) 120/68 104/64      Blood Pressure (Exercise) 128/62 120/60      Blood Pressure (Exit) 122/80 100/60      Heart Rate (Admit) 76 bpm 73 bpm  Heart Rate (Exercise) 101 bpm 110 bpm      Heart Rate (Exit) 85 bpm 82 bpm      Rating of Perceived Exertion (Exercise) 12 12      Duration Continue with 30 min of aerobic exercise without  signs/symptoms of physical distress. Continue with 30 min of aerobic exercise without signs/symptoms of physical distress.      Intensity THRR unchanged THRR unchanged        Progression   Progression Continue to progress workloads to maintain intensity without signs/symptoms of physical distress. Continue to progress workloads to maintain intensity without signs/symptoms of physical distress.        Resistance Training   Training Prescription Yes Yes      Weight 3 4      Reps 10-15 10-15      Time 10 Minutes 10 Minutes        Treadmill   MPH 2.1 2.2      Grade 0 0      Minutes 17 17      METs 2.61 2.69        NuStep   Level 1 1      SPM 107 114      Minutes 22 22      METs 2.49 2.83               Exercise Comments:   Exercise Goals and Review:   Exercise Goals     Row Name 09/09/22 1001 10/04/22 1043           Exercise Goals   Increase Physical Activity Yes Yes      Intervention Provide advice, education, support and counseling about physical activity/exercise needs.;Develop an individualized exercise prescription for aerobic and resistive training based on initial evaluation findings, risk stratification, comorbidities and participant's personal goals. Provide advice, education, support and counseling about physical activity/exercise needs.;Develop an individualized exercise prescription for aerobic and resistive training based on initial evaluation findings, risk stratification, comorbidities and participant's personal goals.      Expected Outcomes Short Term: Attend rehab on a regular basis to increase amount of physical activity.;Long Term: Add in home exercise to make exercise part of routine and to increase amount of physical activity.;Long Term: Exercising regularly at least 3-5 days a week. Short Term: Attend rehab on a regular basis to increase amount of physical activity.;Long Term: Add in home exercise to make exercise part of routine and to increase amount of  physical activity.;Long Term: Exercising regularly at least 3-5 days a week.      Increase Strength and Stamina Yes Yes      Intervention Provide advice, education, support and counseling about physical activity/exercise needs.;Develop an individualized exercise prescription for aerobic and resistive training based on initial evaluation findings, risk stratification, comorbidities and participant's personal goals. Provide advice, education, support and counseling about physical activity/exercise needs.;Develop an individualized exercise prescription for aerobic and resistive training based on initial evaluation findings, risk stratification, comorbidities and participant's personal goals.      Expected Outcomes Short Term: Increase workloads from initial exercise prescription for resistance, speed, and METs.;Short Term: Perform resistance training exercises routinely during rehab and add in resistance training at home;Long Term: Improve cardiorespiratory fitness, muscular endurance and strength as measured by increased METs and functional capacity (6MWT) Short Term: Increase workloads from initial exercise prescription for resistance, speed, and METs.;Short Term: Perform resistance training exercises routinely during rehab and add in resistance training at home;Long Term: Improve cardiorespiratory fitness, muscular endurance  and strength as measured by increased METs and functional capacity (6MWT)      Able to understand and use rate of perceived exertion (RPE) scale Yes Yes      Intervention Provide education and explanation on how to use RPE scale Provide education and explanation on how to use RPE scale      Expected Outcomes Short Term: Able to use RPE daily in rehab to express subjective intensity level;Long Term:  Able to use RPE to guide intensity level when exercising independently Short Term: Able to use RPE daily in rehab to express subjective intensity level;Long Term:  Able to use RPE to guide  intensity level when exercising independently      Knowledge and understanding of Target Heart Rate Range (THRR) Yes Yes      Intervention Provide education and explanation of THRR including how the numbers were predicted and where they are located for reference Provide education and explanation of THRR including how the numbers were predicted and where they are located for reference      Expected Outcomes Short Term: Able to state/look up THRR;Short Term: Able to use daily as guideline for intensity in rehab;Long Term: Able to use THRR to govern intensity when exercising independently Short Term: Able to state/look up THRR;Short Term: Able to use daily as guideline for intensity in rehab;Long Term: Able to use THRR to govern intensity when exercising independently      Able to check pulse independently Yes Yes      Intervention Provide education and demonstration on how to check pulse in carotid and radial arteries.;Review the importance of being able to check your own pulse for safety during independent exercise Provide education and demonstration on how to check pulse in carotid and radial arteries.;Review the importance of being able to check your own pulse for safety during independent exercise      Expected Outcomes Short Term: Able to explain why pulse checking is important during independent exercise;Long Term: Able to check pulse independently and accurately Short Term: Able to explain why pulse checking is important during independent exercise;Long Term: Able to check pulse independently and accurately      Understanding of Exercise Prescription Yes Yes      Intervention Provide education, explanation, and written materials on patient's individual exercise prescription Provide education, explanation, and written materials on patient's individual exercise prescription      Expected Outcomes Short Term: Able to explain program exercise prescription;Long Term: Able to explain home exercise  prescription to exercise independently Short Term: Able to explain program exercise prescription;Long Term: Able to explain home exercise prescription to exercise independently               Exercise Goals Re-Evaluation :  Exercise Goals Re-Evaluation     [place] Name 10/04/22 1044             Exercise Goal Re-Evaluation   Exercise Goals Review Increase Physical Activity;Increase Strength and Stamina;Able to understand and use rate of perceived exertion (RPE) scale;Knowledge and understanding of Target Heart Rate Range (THRR);Able to check pulse independently;Understanding of Exercise Prescription       Comments Pt has completed 10 sessions of cardiac rehab. She has been able to progress her workloads and is progressing well in the program. She seems to enjoy the classes and is exercising at home on her off days. She is currently exercising at 2.83 METs on the stepper. Will continue to monitor and progress as able.  Expected Outcomes Through exercise at rehab and home, the patient will meet their stated goals.                 Discharge Exercise Prescription (Final Exercise Prescription Changes):  Exercise Prescription Changes - 10/01/22 0815       Response to Exercise   Blood Pressure (Admit) 104/64    Blood Pressure (Exercise) 120/60    Blood Pressure (Exit) 100/60    Heart Rate (Admit) 73 bpm    Heart Rate (Exercise) 110 bpm    Heart Rate (Exit) 82 bpm    Rating of Perceived Exertion (Exercise) 12    Duration Continue with 30 min of aerobic exercise without signs/symptoms of physical distress.    Intensity THRR unchanged      Progression   Progression Continue to progress workloads to maintain intensity without signs/symptoms of physical distress.      Resistance Training   Training Prescription Yes    Weight 4    Reps 10-15    Time 10 Minutes      Treadmill   MPH 2.2    Grade 0    Minutes 17    METs 2.69      NuStep   Level 1    SPM 114    Minutes 22     METs 2.83             Nutrition:  Target Goals: Understanding of nutrition guidelines, daily intake of sodium '1500mg'$ , cholesterol '200mg'$ , calories 30% from fat and 7% or less from saturated fats, daily to have 5 or more servings of fruits and vegetables.  Biometrics:  Pre Biometrics - 09/09/22 1002       Pre Biometrics   Height '5\' 4"'$  (1.626 m)    Weight 82.4 kg    Waist Circumference 38 inches    Hip Circumference 43.5 inches    Waist to Hip Ratio 0.87 %    BMI (Calculated) 31.17    Triceps Skinfold 24 mm    % Body Fat 41.3 %    Grip Strength 23.5 kg    Flexibility 8 in    Single Leg Stand 11.8 seconds              Nutrition Therapy Plan and Nutrition Goals:  Nutrition Therapy & Goals - 09/27/22 0914       Personal Nutrition Goals   Comments Patient scored 42 on her diet assessment. We offer 2 educational sessions on heart healthy nutrition with handouts and assistance with RD referral if patient is interested.      Intervention Plan   Intervention Nutrition handout(s) given to patient.    Expected Outcomes Long Term Goal: Adherence to prescribed nutrition plan.             Nutrition Assessments:  Nutrition Assessments - 09/09/22 0857       MEDFICTS Scores   Pre Score 42            MEDIFICTS Score Key: ?70 Need to make dietary changes  40-70 Heart Healthy Diet ? 40 Therapeutic Level Cholesterol Diet   Picture Your Plate Scores: <28 Unhealthy dietary pattern with much room for improvement. 41-50 Dietary pattern unlikely to meet recommendations for good health and room for improvement. 51-60 More healthful dietary pattern, with some room for improvement.  >60 Healthy dietary pattern, although there may be some specific behaviors that could be improved.    Nutrition Goals Re-Evaluation:   Nutrition Goals Discharge (Final Nutrition Goals Re-Evaluation):  Psychosocial: Target Goals: Acknowledge presence or absence of significant  depression and/or stress, maximize coping skills, provide positive support system. Participant is able to verbalize types and ability to use techniques and skills needed for reducing stress and depression.  Initial Review & Psychosocial Screening:  Initial Psych Review & Screening - 09/09/22 0921       Initial Review   Current issues with Current Sleep Concerns;Current Stress Concerns;History of Depression    Source of Stress Concerns Chronic Illness    Comments Due to a heart attack and a CVA within a couple of weeks of each other      [place]? Yes    Comments Her sisters and her husband are her main support system.      Barriers   Psychosocial barriers to participate in program Psychosocial barriers identified (see note)      Screening Interventions   Interventions Encouraged to exercise    Expected Outcomes Long Term goal: The participant improves quality of Life and PHQ9 Scores as seen by post scores and/or verbalization of changes;Short Term goal: Identification and review with participant of any Quality of Life or Depression concerns found by scoring the questionnaire.             Quality of Life Scores:  Quality of Life - 09/09/22 1002       Quality of Life   Select Quality of Life      Quality of Life Scores   Health/Function Pre 19.53 %    Socioeconomic Pre 29.63 %    Psych/Spiritual Pre 17.93 %    Family Pre 21 %    GLOBAL Pre 20.81 %            Scores of 19 and below usually indicate a poorer quality of life in these areas.  A difference of  2-3 points is a clinically meaningful difference.  A difference of 2-3 points in the total score of the Quality of Life Index has been associated with significant improvement in overall quality of life, self-image, physical symptoms, and general health in studies assessing change in quality of life.  PHQ-9: Review Flowsheet       09/09/2022 06/17/2021  Depression screen PHQ 2/9   Decreased Interest 3 1  Down, Depressed, Hopeless 3 1  PHQ - 2 Score 6 2  Altered sleeping 3 1  Tired, decreased energy 3 1  Change in appetite 3 1  Feeling bad or failure about yourself  3 0  Trouble concentrating 3 0  Moving slowly or fidgety/restless 2 0  Suicidal thoughts 0 0  PHQ-9 Score 23 5  Difficult doing work/chores Somewhat difficult Somewhat difficult   Interpretation of Total Score  Total Score Depression Severity:  1-4 = Minimal depression, 5-9 = Mild depression, 10-14 = Moderate depression, 15-19 = Moderately severe depression, 20-27 = Severe depression   Psychosocial Evaluation and Intervention:  Psychosocial Evaluation - 09/09/22 3664       Psychosocial Evaluation & Interventions   Interventions Encouraged to exercise with the program and follow exercise prescription    Comments Pt has no barriers to partcipating in CR. She has a history of depression and formerly took Bupropion. She scored a 23 on her PHQ-9, but she denies having depression currently. She states that her current state is due to her health problems. She has a STEMI and a CVA within two weeks of each other. She feels like she does not have any energy, so she  does not feel like doing anything. Her current health concerns are a source of stress for her. She also has sleep concerns and she formerly took Trazodone, but she reports not taking anything currently. Dr. Krista Blue, from [facility], suspects that her altered sleep may be due to underlying OSA, so she has a sleep study scheduled. She denies any thoughts of harming herself, and overall has a positive outlook despite her high PHQ-9 score. She reports that she has a good support system with her husband and her sisters. Her goals while in the program are to improve her energy levels and to improve her sleep. She has already begun taking aqua aerobics classes at the [facility], and she looks forward to participating in CR as well.    Expected  Outcomes Pt's depression and stress related symptoms will reduce as she continues to recover from her STEMI and CVA. Her underlying sleep issues will be determined and treated.    Continue Psychosocial Services  Follow up required by staff             Psychosocial Re-Evaluation:  Psychosocial Re-Evaluation     [place] Name 09/27/22 0907             Psychosocial Re-Evaluation   Current issues with None Identified       Comments Patient is new to the program completed 7 sessions. She continues to have no psychosocial barriers identified. Patietn continues to take trazodone for sleep. She seems to enjoy the sessions and demonstrates an interest in improving her health. We will continue to monitor.       Expected Outcomes Patient continues to have no psychosocial barriers identified and her sleep will continue to be managed.       Interventions Encouraged to attend Cardiac Rehabilitation for the exercise;Stress management education;Relaxation education       Continue Psychosocial Services  No Follow up required                Psychosocial Discharge (Final Psychosocial Re-Evaluation):  Psychosocial Re-Evaluation - 09/27/22 0907       Psychosocial Re-Evaluation   Current issues with None Identified    Comments Patient is new to the program completed 7 sessions. She continues to have no psychosocial barriers identified. Patietn continues to take trazodone for sleep. She seems to enjoy the sessions and demonstrates an interest in improving her health. We will continue to monitor.    Expected Outcomes Patient continues to have no psychosocial barriers identified and her sleep will continue to be managed.    Interventions Encouraged to attend Cardiac Rehabilitation for the exercise;Stress management education;Relaxation education    Continue Psychosocial Services  No Follow up required             Vocational Rehabilitation: Provide vocational rehab assistance to qualifying  candidates.   Vocational Rehab Evaluation & Intervention:  Vocational Rehab - 09/09/22 0835       Initial Vocational Rehab Evaluation & Intervention   Assessment shows need for Vocational Rehabilitation No      Vocational Rehab Re-Evaulation   Comments She is retired             Education: Education Goals: Education classes will be provided on a weekly basis, covering required topics. Participant will state understanding/return demonstration of topics presented.  Learning Barriers/Preferences:  Learning Barriers/Preferences - 09/09/22 0835       Learning Barriers/Preferences   Learning Barriers None    Learning Preferences Audio  Education Topics: Hypertension, Hypertension Reduction -Define heart disease and high blood pressure. Discus how high blood pressure affects the body and ways to reduce high blood pressure. Flowsheet Row CARDIAC REHAB PHASE II EXERCISE from 09/29/2022 in [place]  Date 09/29/22  Educator hj  Instruction Review Code 1- Verbalizes Understanding       Exercise and Your Heart -Discuss why it is important to exercise, the FITT principles of exercise, normal and abnormal responses to exercise, and how to exercise safely.   Angina -Discuss definition of angina, causes of angina, treatment of angina, and how to decrease risk of having angina.   Cardiac Medications -Review what the following cardiac medications are used for, how they affect the body, and side effects that may occur when taking the medications.  Medications include Aspirin, Beta blockers, calcium channel blockers, ACE Inhibitors, angiotensin receptor blockers, diuretics, digoxin, and antihyperlipidemics.   Congestive Heart Failure -Discuss the definition of CHF, how to live with CHF, the signs and symptoms of CHF, and how keep track of weight and sodium intake.   Heart Disease and Intimacy -Discus the effect sexual activity has on the  heart, how changes occur during intimacy as we age, and safety during sexual activity.   Smoking Cessation / COPD -Discuss different methods to quit smoking, the health benefits of quitting smoking, and the definition of COPD.   Nutrition I: Fats -Discuss the types of cholesterol, what cholesterol does to the heart, and how cholesterol levels can be controlled.   Nutrition II: Labels -Discuss the different components of food labels and how to read food label   Heart Parts/Heart Disease and PAD -Discuss the anatomy of the heart, the pathway of blood circulation through the heart, and these are affected by heart disease.   Stress I: Signs and Symptoms -Discuss the causes of stress, how stress may lead to anxiety and depression, and ways to limit stress. Flowsheet Row CARDIAC REHAB PHASE II EXERCISE from 09/29/2022 in [place]  Date 09/15/22  Educator DF  Instruction Review Code 1- Verbalizes Understanding       Stress II: Relaxation -Discuss different types of relaxation techniques to limit stress. Flowsheet Row CARDIAC REHAB PHASE II EXERCISE from 09/29/2022 in [place]  Date 09/22/22  Educator DF  Instruction Review Code 2- Demonstrated Understanding       Warning Signs of Stroke / TIA -Discuss definition of a stroke, what the signs and symptoms are of a stroke, and how to identify when someone is having stroke.   Knowledge Questionnaire Score:  Knowledge Questionnaire Score - 09/09/22 0834       Knowledge Questionnaire Score   Pre Score 19/24             Core Components/Risk Factors/Patient Goals at Admission:  Personal Goals and Risk Factors at Admission - 09/09/22 0900       Core Components/Risk Factors/Patient Goals on Admission   Hypertension Yes    Intervention Provide education on lifestyle modifcations including regular physical activity/exercise, weight management, moderate sodium restriction and  increased consumption of fresh fruit, vegetables, and low fat dairy, alcohol moderation, and smoking cessation.;Monitor prescription use compliance.    Expected Outcomes Short Term: Continued assessment and intervention until BP is < 140/65m HG in hypertensive participants. < 130/860mHG in hypertensive participants with diabetes, heart failure or chronic kidney disease.;Long Term: Maintenance of blood pressure at goal levels.    Lipids Yes    Intervention Provide education and  support for participant on nutrition & aerobic/resistive exercise along with prescribed medications to achieve LDL '70mg'$ , HDL >'40mg'$ .    Expected Outcomes Short Term: Participant states understanding of desired cholesterol values and is compliant with medications prescribed. Participant is following exercise prescription and nutrition guidelines.;Long Term: Cholesterol controlled with medications as prescribed, with individualized exercise RX and with personalized nutrition plan. Value goals: LDL < '70mg'$ , HDL > 40 mg.    Personal Goal Other Yes    Personal Goal Improve energy levels and improve sleeping.    Intervention Attend CR three days per week and continue to exercise at home 2-4 days per week.    Expected Outcomes Pt will meet stated goals.             Core Components/Risk Factors/Patient Goals Review:   Goals and Risk Factor Review     Row Name 09/27/22 0910             Core Components/Risk Factors/Patient Goals Review   Personal Goals Review Hypertension;Lipids;Other       Review Patient was referred to CR with STEMI and stent placement. She has multiple risk factors for CAD and is participating in the program for risk modification. She is new to the program completing 7 sessions. Her current weight is 178.9 down 2.1 lbs from her initial weight. She is doing well in the program. Her blood pressure is well controlled. Her personal goals for the program are to improve her energy levels and her sleep. We will  continue to monitor her progress as she works towards [employer] these goals.       Expected Outcomes Patient will complete the program meeting both persona and program goals.                Core Components/Risk Factors/Patient Goals at Discharge (Final Review):   Goals and Risk Factor Review - 09/27/22 0910       Core Components/Risk Factors/Patient Goals Review   Personal Goals Review Hypertension;Lipids;Other    Review Patient was referred to CR with STEMI and stent placement. She has multiple risk factors for CAD and is participating in the program for risk modification. She is new to the program completing 7 sessions. Her current weight is 178.9 down 2.1 lbs from her initial weight. She is doing well in the program. Her blood pressure is well controlled. Her personal goals for the program are to improve her energy levels and her sleep. We will continue to monitor her progress as she works towards [employer] these goals.    Expected Outcomes Patient will complete the program meeting both persona and program goals.             ITP Comments:   Comments: ITP REVIEW Pt is making expected progress toward Cardiac Rehab goals after completing 10 sessions. Recommend continued exercise, life style modification, education, and increased stamina and strength.

## 2022-10-07 ENCOUNTER — Other Ambulatory Visit (HOSPITAL_COMMUNITY): Payer: Self-pay

## 2022-10-08 ENCOUNTER — Encounter (HOSPITAL_COMMUNITY)
Admission: RE | Admit: 2022-10-08 | Discharge: 2022-10-08 | Disposition: A | Discharge status: Home / Self Care | Payer: Medicare HMO | Source: Ambulatory Visit | Attending: Cardiology | Admitting: Cardiology

## 2022-10-08 ENCOUNTER — Ambulatory Visit (HOSPITAL_COMMUNITY): Payer: TRICARE For Life (TFL)

## 2022-10-08 DIAGNOSIS — I2111 ST elevation (STEMI) myocardial infarction involving right coronary artery: Secondary | ICD-10-CM

## 2022-10-08 DIAGNOSIS — Z955 Presence of coronary angioplasty implant and graft: Secondary | ICD-10-CM

## 2022-10-08 NOTE — Progress Notes (Signed)
Daily Session Note  Patient Details  Name: Katherine Short MRN: [id number] Date of Birth: 1951-08-18 Referring Provider:   Flowsheet Row CARDIAC REHAB PHASE II ORIENTATION from 09/09/2022 in [facility]  Referring Provider Dr. Martinique       Encounter Date: 10/08/2022  Check In:  Session Check In - 10/08/22 0815       Check-In   Supervising physician immediately available to respond to emergencies [facility] MD immediately available    Physician(s) Dr. Gasper Sells    Location [facility]    Staff Present Redge Gainer, BS, Exercise Physiologist;Daphyne Hassell Done, RN, BSN    Virtual Visit No    Medication changes reported     No    Fall or balance concerns reported    Yes    Comments She has fallen twice this year, with LOC with one fall.    Tobacco Cessation No Change    Warm-up and Cool-down Performed as group-led instruction    Resistance Training Performed Yes    VAD Patient? No    PAD/SET Patient? No      Pain Assessment   Currently in Pain? No/denies    Multiple Pain Sites No             Capillary Blood Glucose: No results found for this or any previous visit (from the past 24 hour(s)).    Social History   Tobacco Use  Smoking Status Never  Smokeless Tobacco Never    Goals Met:  Independence with exercise equipment Exercise tolerated well No report of concerns or symptoms today Strength training completed today  Goals Unmet:  Not Applicable  Comments: check out 0915   Dr. Carlyle Dolly is Medical Director for [facility]

## 2022-10-11 ENCOUNTER — Encounter (HOSPITAL_COMMUNITY)
Admission: RE | Admit: 2022-10-11 | Discharge: 2022-10-11 | Disposition: A | Discharge status: Home / Self Care | Payer: Medicare HMO | Source: Ambulatory Visit | Attending: Cardiology | Admitting: Cardiology

## 2022-10-11 ENCOUNTER — Ambulatory Visit (HOSPITAL_COMMUNITY): Payer: TRICARE For Life (TFL)

## 2022-10-11 DIAGNOSIS — I2111 ST elevation (STEMI) myocardial infarction involving right coronary artery: Secondary | ICD-10-CM

## 2022-10-11 DIAGNOSIS — Z955 Presence of coronary angioplasty implant and graft: Secondary | ICD-10-CM

## 2022-10-11 NOTE — Progress Notes (Signed)
Daily Session Note  Patient Details  Name: Katherine Short MRN: [id number] Date of Birth: 06/11/1951 Referring Provider:   Flowsheet Row CARDIAC REHAB PHASE II ORIENTATION from 09/09/2022 in [place]  Referring Provider Dr. Martinique       Encounter Date: 10/11/2022  Check In:  Session Check In - 10/11/22 0815       Check-In   Supervising physician immediately available to respond to emergencies [facility] MD immediately available    Physician(s) Dr. Audie Box    Location [facility]    Staff Present Redge Gainer, BS, Exercise Physiologist;Daphyne Hassell Done, RN, Bjorn Loser, MS, ACSM-CEP, Exercise Physiologist    Virtual Visit No    Medication changes reported     No    Fall or balance concerns reported    Yes    Comments She has fallen twice this year, with LOC with one fall.    Tobacco Cessation No Change    Warm-up and Cool-down Performed as group-led instruction    Resistance Training Performed Yes    VAD Patient? No    PAD/SET Patient? No      Pain Assessment   Currently in Pain? No/denies    Multiple Pain Sites No             Capillary Blood Glucose: No results found for this or any previous visit (from the past 24 hour(s)).    Social History   Tobacco Use  Smoking Status Never  Smokeless Tobacco Never    Goals Met:  Independence with exercise equipment Exercise tolerated well No report of concerns or symptoms today Strength training completed today  Goals Unmet:  Not Applicable  Comments: check out 0915   Dr. Carlyle Dolly is Medical Director for [place]

## 2022-10-13 ENCOUNTER — Ambulatory Visit (HOSPITAL_COMMUNITY): Payer: TRICARE For Life (TFL)

## 2022-10-13 ENCOUNTER — Encounter (HOSPITAL_COMMUNITY): Payer: Medicare HMO

## 2022-10-14 ENCOUNTER — Telehealth: Payer: Self-pay

## 2022-10-14 DIAGNOSIS — E785 Hyperlipidemia, unspecified: Secondary | ICD-10-CM

## 2022-10-14 DIAGNOSIS — Z79899 Other long term (current) drug therapy: Secondary | ICD-10-CM

## 2022-10-14 NOTE — Telephone Encounter (Signed)
Spoke with pt. Pt was notified of lab results and recommendations. Pt is aware that we will repeat LFTs in 6 weeks. Pt has an appointment on 11/01/2022. Pt is aware that labs may be completed at this appointment if advised by Diona Browner, NP.

## 2022-10-15 ENCOUNTER — Ambulatory Visit (HOSPITAL_COMMUNITY): Payer: TRICARE For Life (TFL)

## 2022-10-15 ENCOUNTER — Encounter (HOSPITAL_COMMUNITY)
Admission: RE | Admit: 2022-10-15 | Discharge: 2022-10-15 | Disposition: A | Discharge status: Home / Self Care | Payer: Medicare HMO | Source: Ambulatory Visit | Attending: Cardiology | Admitting: Cardiology

## 2022-10-15 DIAGNOSIS — Z955 Presence of coronary angioplasty implant and graft: Secondary | ICD-10-CM

## 2022-10-15 DIAGNOSIS — I2111 ST elevation (STEMI) myocardial infarction involving right coronary artery: Secondary | ICD-10-CM

## 2022-10-15 NOTE — Progress Notes (Signed)
Daily Session Note  Patient Details  Name: Katherine Short MRN: [id number] Date of Birth: [birth year] Referring Provider:   Flowsheet Row CARDIAC REHAB PHASE II ORIENTATION from 09/09/2022 in [place]  Referring Provider Dr. Martinique       Encounter Date: 10/15/2022  Check In:  Session Check In - 10/15/22 0815       Check-In   Supervising physician immediately available to respond to emergencies [facility] immediately available    Physician(s) Dr. Dellia Cloud    Location [facility]    Staff Present Hoy Register, MS, ACSM-CEP, Exercise Physiologist;Daphyne Hassell Done, RN, BSN    Virtual Visit No    Medication changes reported     No    Fall or balance concerns reported    Yes    Comments She has fallen twice this year, with LOC with one fall.    Tobacco Cessation No Change    Warm-up and Cool-down Performed as group-led instruction    Resistance Training Performed Yes    VAD Patient? No    PAD/SET Patient? No      Pain Assessment   Currently in Pain? No/denies    Multiple Pain Sites No             Capillary Blood Glucose: No results found for this or any previous visit (from the past 24 hour(s)).    Social History   Tobacco Use  Smoking Status Never  Smokeless Tobacco Never    Goals Met:  Independence with exercise equipment Exercise tolerated well No report of concerns or symptoms today Strength training completed today  Goals Unmet:  Not Applicable  Comments: checkout time is 0915   Dr. Carlyle Dolly is Medical Director for [place]

## 2022-10-18 ENCOUNTER — Encounter (HOSPITAL_COMMUNITY)
Admission: RE | Admit: 2022-10-18 | Discharge: 2022-10-18 | Disposition: A | Discharge status: Home / Self Care | Payer: Medicare HMO | Source: Ambulatory Visit | Attending: Cardiology | Admitting: Cardiology

## 2022-10-18 ENCOUNTER — Ambulatory Visit (HOSPITAL_COMMUNITY): Payer: TRICARE For Life (TFL)

## 2022-10-18 VITALS — Wt 179.5 lb

## 2022-10-18 DIAGNOSIS — I2111 ST elevation (STEMI) myocardial infarction involving right coronary artery: Secondary | ICD-10-CM | POA: Diagnosis not present

## 2022-10-18 DIAGNOSIS — Z955 Presence of coronary angioplasty implant and graft: Secondary | ICD-10-CM

## 2022-10-18 NOTE — Progress Notes (Signed)
Daily Session Note  Patient Details  Name: NADALEE NEISWENDER MRN: [id number] Date of Birth: 1951-01-28 Referring Provider:   Flowsheet Row CARDIAC REHAB PHASE II ORIENTATION from 09/09/2022 in [place]  Referring Provider Dr. Martinique       Encounter Date: 10/18/2022  Check In:  Session Check In - 10/18/22 0815       Check-In   Supervising physician immediately available to respond to emergencies [facility] immediately available    Physician(s) Dr. Harl Bowie    Location [facility]    Staff Present Hoy Register, MS, ACSM-CEP, Exercise Physiologist;Daphyne Hassell Done, RN, BSN;Darrian Grzelak Otho Ket, BS, Exercise Physiologist    Virtual Visit No    Medication changes reported     No    Fall or balance concerns reported    Yes    Comments She has fallen twice this year, with LOC with one fall.    Tobacco Cessation No Change    Warm-up and Cool-down Performed as group-led instruction    Resistance Training Performed Yes    VAD Patient? No    PAD/SET Patient? No      Pain Assessment   Currently in Pain? No/denies    Multiple Pain Sites No             Capillary Blood Glucose: No results found for this or any previous visit (from the past 24 hour(s)).    Social History   Tobacco Use  Smoking Status Never  Smokeless Tobacco Never    Goals Met:  Independence with exercise equipment Exercise tolerated well No report of concerns or symptoms today Strength training completed today  Goals Unmet:  Not Applicable  Comments: check out 0915   Dr. Carlyle Dolly is Medical Director for [place]

## 2022-10-19 ENCOUNTER — Telehealth: Payer: Self-pay | Admitting: Neurology

## 2022-10-19 NOTE — Telephone Encounter (Signed)
[employer] medicare pending uploaded notes  

## 2022-10-20 ENCOUNTER — Ambulatory Visit (HOSPITAL_COMMUNITY): Payer: TRICARE For Life (TFL)

## 2022-10-20 ENCOUNTER — Encounter (HOSPITAL_COMMUNITY)
Admission: RE | Admit: 2022-10-20 | Discharge: 2022-10-20 | Disposition: A | Discharge status: Home / Self Care | Payer: Medicare HMO | Source: Ambulatory Visit | Attending: Cardiology | Admitting: Cardiology

## 2022-10-20 DIAGNOSIS — I2111 ST elevation (STEMI) myocardial infarction involving right coronary artery: Secondary | ICD-10-CM

## 2022-10-20 DIAGNOSIS — Z955 Presence of coronary angioplasty implant and graft: Secondary | ICD-10-CM

## 2022-10-20 NOTE — Progress Notes (Signed)
Daily Session Note  Patient Details  Name: Katherine Short MRN: [id number] Date of Birth: 05-May-1951 Referring Provider:   Flowsheet Row CARDIAC REHAB PHASE II ORIENTATION from 09/09/2022 in [place]  Referring Provider Dr. Martinique       Encounter Date: 10/20/2022  Check In:  Session Check In - 10/20/22 0808       Check-In   Supervising physician immediately available to respond to emergencies [facility] immediately available    Physician(s) Dr. Marlou Porch    Location [facility]    Staff Present Hoy Register MHA, MS, ACSM-CEP;Madelyn Flavors, RN, BSN;Heather Otho Ket, BS, Exercise Physiologist    Virtual Visit No    Medication changes reported     No    Fall or balance concerns reported    Yes    Comments She has fallen twice this year, with LOC with one fall.    Tobacco Cessation No Change    Warm-up and Cool-down Performed as group-led instruction    Resistance Training Performed Yes    VAD Patient? No    PAD/SET Patient? No      Pain Assessment   Currently in Pain? No/denies    Multiple Pain Sites No             Capillary Blood Glucose: No results found for this or any previous visit (from the past 24 hour(s)).    Social History   Tobacco Use  Smoking Status Never  Smokeless Tobacco Never    Goals Met:  Independence with exercise equipment Exercise tolerated well No report of concerns or symptoms today Strength training completed today  Goals Unmet:  Not Applicable  Comments: checkout time is 0915   Dr. Carlyle Dolly is Medical Director for [place]

## 2022-10-22 ENCOUNTER — Ambulatory Visit (HOSPITAL_COMMUNITY): Payer: TRICARE For Life (TFL)

## 2022-10-22 ENCOUNTER — Encounter (HOSPITAL_COMMUNITY)
Admission: RE | Admit: 2022-10-22 | Discharge: 2022-10-22 | Disposition: A | Discharge status: Home / Self Care | Payer: Medicare HMO | Source: Ambulatory Visit | Attending: Cardiology | Admitting: Cardiology

## 2022-10-22 DIAGNOSIS — I2111 ST elevation (STEMI) myocardial infarction involving right coronary artery: Secondary | ICD-10-CM

## 2022-10-22 DIAGNOSIS — Z955 Presence of coronary angioplasty implant and graft: Secondary | ICD-10-CM

## 2022-10-22 NOTE — Progress Notes (Signed)
I have reviewed a Home Exercise Prescription with Katherine Short . Katherine Short is currently exercising at home.  The patient was advised to walk 2 days a week for 30-45 minutes.  Katherine Short and I discussed how to progress their exercise prescription.  The patient stated that their goals were to build up their endurance.  The patient stated that they understand the exercise prescription.  We reviewed exercise guidelines, target heart rate during exercise, RPE Scale, weather conditions, NTG use, endpoints for exercise, warmup and cool down.  Patient is encouraged to come to me with any questions. I will continue to follow up with the patient to assist them with progression and safety.

## 2022-10-22 NOTE — Progress Notes (Signed)
Daily Session Note  Patient Details  Name: LEASA KINCANNON MRN: [id number] Date of Birth: 08-04-1951 Referring Provider:   Flowsheet Row CARDIAC REHAB PHASE II ORIENTATION from 09/09/2022 in [place]  Referring Provider Dr. Martinique       Encounter Date: 10/22/2022  Check In:  Session Check In - 10/22/22 0813       Check-In   Supervising physician immediately available to respond to emergencies [facility] MD immediately available    Physician(s) Domenic Polite    Location [facility]    Staff Present Leana Roe, BS, Exercise Physiologist;Dalton Sherrie George, MS, ACSM-CEP;Emir Nack Wynetta Emery, RN, BSN    Virtual Visit No    Medication changes reported     No    Fall or balance concerns reported    Yes    Comments She has fallen twice this year, with LOC with one fall.    Tobacco Cessation No Change    Warm-up and Cool-down Performed as group-led instruction    Resistance Training Performed Yes    VAD Patient? No    PAD/SET Patient? No      Pain Assessment   Currently in Pain? No/denies    Multiple Pain Sites No             Capillary Blood Glucose: No results found for this or any previous visit (from the past 24 hour(s)).    Social History   Tobacco Use  Smoking Status Never  Smokeless Tobacco Never    Goals Met:  Independence with exercise equipment Exercise tolerated well No report of concerns or symptoms today Strength training completed today  Goals Unmet:  Not Applicable  Comments: Check out 915.   Dr. Carlyle Dolly is Medical Director for [facility]

## 2022-10-25 ENCOUNTER — Ambulatory Visit (HOSPITAL_COMMUNITY): Payer: TRICARE For Life (TFL)

## 2022-10-25 ENCOUNTER — Encounter (HOSPITAL_COMMUNITY)
Admission: RE | Admit: 2022-10-25 | Discharge: 2022-10-25 | Disposition: A | Discharge status: Home / Self Care | Payer: Medicare HMO | Source: Ambulatory Visit | Attending: Cardiology | Admitting: Cardiology

## 2022-10-25 DIAGNOSIS — I2111 ST elevation (STEMI) myocardial infarction involving right coronary artery: Secondary | ICD-10-CM

## 2022-10-25 DIAGNOSIS — Z955 Presence of coronary angioplasty implant and graft: Secondary | ICD-10-CM

## 2022-10-25 NOTE — Progress Notes (Signed)
Daily Session Note  Patient Details  Name: Katherine Short MRN: [id number] Date of Birth: 07-09-1951 Referring Provider:   Flowsheet Row CARDIAC REHAB PHASE II ORIENTATION from 09/09/2022 in [place]  Referring Provider Dr. Martinique       Encounter Date: 10/25/2022  Check In:  Session Check In - 10/25/22 0815       Check-In   Supervising physician immediately available to respond to emergencies [facility] MD immediately available    Physician(s) Dr. Domenic Polite    Location [facility]    Staff Present Leana Roe, BS, Exercise Physiologist;Dalton Sherrie George, MS, ACSM-CEP;Debra Wynetta Emery, RN, BSN;Kyrstyn Greear, RN    Virtual Visit No    Medication changes reported     No    Fall or balance concerns reported    Yes    Comments She has fallen twice this year, with LOC with one fall.    Tobacco Cessation No Change    Warm-up and Cool-down Performed as group-led instruction    Resistance Training Performed Yes    VAD Patient? No    PAD/SET Patient? No      Pain Assessment   Currently in Pain? No/denies    Multiple Pain Sites No             Capillary Blood Glucose: No results found for this or any previous visit (from the past 24 hour(s)).    Social History   Tobacco Use  Smoking Status Never  Smokeless Tobacco Never    Goals Met:  Independence with exercise equipment Exercise tolerated well No report of concerns or symptoms today Strength training completed today  Goals Unmet:  Not Applicable  Comments: check out @ 9:15   Dr. Carlyle Dolly is Medical Director for [place]

## 2022-10-27 ENCOUNTER — Ambulatory Visit (HOSPITAL_COMMUNITY): Payer: TRICARE For Life (TFL)

## 2022-10-27 ENCOUNTER — Encounter (HOSPITAL_COMMUNITY)
Admission: RE | Admit: 2022-10-27 | Discharge: 2022-10-27 | Disposition: A | Discharge status: Home / Self Care | Payer: Medicare HMO | Source: Ambulatory Visit | Attending: Cardiology | Admitting: Cardiology

## 2022-10-27 DIAGNOSIS — Z955 Presence of coronary angioplasty implant and graft: Secondary | ICD-10-CM | POA: Insufficient documentation

## 2022-10-27 DIAGNOSIS — I2111 ST elevation (STEMI) myocardial infarction involving right coronary artery: Secondary | ICD-10-CM | POA: Insufficient documentation

## 2022-10-27 NOTE — Telephone Encounter (Signed)
LVM for pt to call back to schedule   Bernadene Person auth: [id number] (exp. 10/19/22 to 04/17/23)

## 2022-10-27 NOTE — Progress Notes (Signed)
Daily Session Note  Patient Details  Name: Katherine Short MRN: [id number] Date of Birth: 1951/04/12 Referring Provider:   Flowsheet Row CARDIAC REHAB PHASE II ORIENTATION from 09/09/2022 in [place]  Referring Provider Dr. Martinique       Encounter Date: 10/27/2022  Check In:  Session Check In - 10/27/22 0815       Check-In   Supervising physician immediately available to respond to emergencies [facility] MD immediately available    Physician(s) Dr. Domenic Polite    Location [facility]    Staff Present Leana Roe, BS, Exercise Physiologist;Dalton Sherrie George, MS, ACSM-CEP    Virtual Visit No    Medication changes reported     No    Fall or balance concerns reported    Yes    Comments She has fallen twice this year, with LOC with one fall.    Tobacco Cessation No Change    Warm-up and Cool-down Performed as group-led instruction    Resistance Training Performed Yes    VAD Patient? No    PAD/SET Patient? No      Pain Assessment   Currently in Pain? No/denies    Multiple Pain Sites No             Capillary Blood Glucose: No results found for this or any previous visit (from the past 24 hour(s)).    Social History   Tobacco Use  Smoking Status Never  Smokeless Tobacco Never    Goals Met:  Independence with exercise equipment Exercise tolerated well No report of concerns or symptoms today Strength training completed today  Goals Unmet:  Not Applicable  Comments: check out 915   Dr. Carlyle Dolly is Medical Director for [place]

## 2022-10-27 NOTE — Telephone Encounter (Signed)
Patient returned my call.  [employer]- [employer] medic[employer] Josem Kaufmann: [id number] (exp. 10/19/22 to 04/17/23)   She is scheduled at [facility] for 12/28/22 at 8 pm  Mailed packet to the patient.

## 2022-10-29 ENCOUNTER — Encounter (HOSPITAL_COMMUNITY)
Admission: RE | Admit: 2022-10-29 | Discharge: 2022-10-29 | Disposition: A | Discharge status: Home / Self Care | Payer: Medicare HMO | Source: Ambulatory Visit | Attending: Cardiology | Admitting: Cardiology

## 2022-10-29 ENCOUNTER — Ambulatory Visit (HOSPITAL_COMMUNITY): Payer: TRICARE For Life (TFL)

## 2022-10-29 DIAGNOSIS — I2111 ST elevation (STEMI) myocardial infarction involving right coronary artery: Secondary | ICD-10-CM | POA: Diagnosis not present

## 2022-10-29 DIAGNOSIS — Z955 Presence of coronary angioplasty implant and graft: Secondary | ICD-10-CM

## 2022-10-29 NOTE — Progress Notes (Signed)
Daily Session Note  Patient Details  Name: Katherine Short MRN: [id number] Date of Birth: 05/29/1951 Referring Provider:   Flowsheet Row CARDIAC REHAB PHASE II ORIENTATION from 09/09/2022 in [place]  Referring Provider Dr. Martinique       Encounter Date: 10/29/2022  Check In:  Session Check In - 10/29/22 0800       Check-In   Supervising physician immediately available to respond to emergencies [facility] MD immediately available    Physician(s) Dr Harl Bowie    Location [facility]    Staff Present Leana Roe, BS, Exercise Physiologist;Deniel Mcquiston Hassell Done, RN, BSN    Virtual Visit No    Medication changes reported     No    Fall or balance concerns reported    Yes    Comments She has fallen twice this year, with LOC with one fall.    Tobacco Cessation No Change    Warm-up and Cool-down Performed as group-led instruction    Resistance Training Performed Yes    VAD Patient? No    PAD/SET Patient? No      Pain Assessment   Currently in Pain? No/denies    Multiple Pain Sites No             Capillary Blood Glucose: No results found for this or any previous visit (from the past 24 hour(s)).    Social History   Tobacco Use  Smoking Status Never  Smokeless Tobacco Never    Goals Met:  Independence with exercise equipment Exercise tolerated well No report of concerns or symptoms today Strength training completed today  Goals Unmet:  Not Applicable  Comments: Checkout at 0915.   Dr. Carlyle Dolly is Medical Director for [facility]

## 2022-11-01 ENCOUNTER — Ambulatory Visit (HOSPITAL_COMMUNITY): Payer: TRICARE For Life (TFL)

## 2022-11-01 ENCOUNTER — Encounter (HOSPITAL_COMMUNITY)
Admission: RE | Admit: 2022-11-01 | Discharge: 2022-11-01 | Disposition: A | Discharge status: Home / Self Care | Payer: Medicare HMO | Source: Ambulatory Visit | Attending: Cardiology | Admitting: Cardiology

## 2022-11-01 ENCOUNTER — Ambulatory Visit: Payer: Medicare HMO | Attending: Nurse Practitioner | Admitting: Nurse Practitioner

## 2022-11-01 ENCOUNTER — Encounter: Payer: Self-pay | Admitting: Nurse Practitioner

## 2022-11-01 ENCOUNTER — Other Ambulatory Visit: Payer: Self-pay | Admitting: Nurse Practitioner

## 2022-11-01 VITALS — Wt 180.1 lb

## 2022-11-01 VITALS — BP 130/78 | HR 72 | Ht 64.0 in | Wt 180.4 lb

## 2022-11-01 DIAGNOSIS — I2510 Atherosclerotic heart disease of native coronary artery without angina pectoris: Secondary | ICD-10-CM | POA: Diagnosis not present

## 2022-11-01 DIAGNOSIS — Z8673 Personal history of transient ischemic attack (TIA), and cerebral infarction without residual deficits: Secondary | ICD-10-CM

## 2022-11-01 DIAGNOSIS — Z955 Presence of coronary angioplasty implant and graft: Secondary | ICD-10-CM

## 2022-11-01 DIAGNOSIS — E785 Hyperlipidemia, unspecified: Secondary | ICD-10-CM

## 2022-11-01 DIAGNOSIS — I10 Essential (primary) hypertension: Secondary | ICD-10-CM

## 2022-11-01 DIAGNOSIS — I2111 ST elevation (STEMI) myocardial infarction involving right coronary artery: Secondary | ICD-10-CM

## 2022-11-01 NOTE — Progress Notes (Signed)
Daily Session Note  Patient Details  Name: Feleica B Cousin MRN: [id number] Date of Birth: 01/07/1951 Referring Provider:   Flowsheet Row CARDIAC REHAB PHASE II ORIENTATION from 09/09/2022 in [place] [facility]  Referring Provider Dr. Jordan       Encounter Date: 11/01/2022  Check In:  Session Check In - 11/01/22 0813       Check-In   Supervising physician immediately available to respond to emergencies [facility] MD immediately available    Physician(s) Dr. Mallipeddi    Location [facility]    Staff Present Heather Bailey, BS, Exercise Physiologist;Dalton Fletcher MHA, MS, ACSM-CEP;Daphyne Martin, RN, BSN    Virtual Visit No    Medication changes reported     No    Fall or balance concerns reported    Yes    Comments She has fallen twice this year, with LOC with one fall.    Tobacco Cessation No Change    Warm-up and Cool-down Performed as group-led instruction    Resistance Training Performed Yes    VAD Patient? No    PAD/SET Patient? No      Pain Assessment   Currently in Pain? No/denies    Multiple Pain Sites No             Capillary Blood Glucose: No results found for this or any previous visit (from the past 24 hour(s)).    Social History   Tobacco Use  Smoking Status Never  Smokeless Tobacco Never    Goals Met:  Independence with exercise equipment Exercise tolerated well No report of concerns or symptoms today Strength training completed today  Goals Unmet:  Not Applicable  Comments: checkout time is 0915   Dr. Jonathan Branch is Medical Director for [place] [facility] 

## 2022-11-01 NOTE — Patient Instructions (Signed)
Medication Instructions:  No Changes *If you need a refill on your cardiac medications before your next appointment, please call your pharmacy*   Lab Work: Hepatic Panel Today. If you have labs (blood work) drawn today and your tests are completely normal, you will receive your results only by: [place] (if you have MyChart) OR A paper copy in the mail If you have any lab test that is abnormal or we need to change your treatment, we will call you to review the results.   Testing/Procedures: No Testing   Follow-Up: At [facility], you and your health needs are our priority.  As part of our continuing mission to provide you with exceptional heart care, we have created designated Provider Care Teams.  These Care Teams include your primary Cardiologist (physician) and Advanced Practice Providers (APPs -  Physician Assistants and Nurse Practitioners) who all work together to provide you with the care you need, when you need it.  We recommend signing up for the patient portal called "MyChart".  Sign up information is provided on this After Visit Summary.  MyChart is used to connect with patients for Virtual Visits (Telemedicine).  Patients are able to view lab/test results, encounter notes, upcoming appointments, etc.  Non-urgent messages can be sent to your provider as well.   To learn more about what you can do with MyChart, go to [email or web address].    Your next appointment:   3-4 month(s)  The format for your next appointment:   In Person  Provider:   Peter Martinique, MD

## 2022-11-01 NOTE — Progress Notes (Signed)
Office Visit    Patient Name: Katherine Short Date of Encounter: 11/01/2022  Primary Care Provider:  Valentino Nose, FNP Primary Cardiologist:  Peter Martinique, MD  Chief Complaint    71 year old female with a history of CAD s/p STEMI, DES-RCA in 07/2022, CVA, hypertension, hyperlipidemia, GERD, and anxiety who presents for follow-up related to CAD.    Past Medical History    Past Medical History:  Diagnosis Date   Anxiety    Chest pain 03/19/2010   nuclear study was negative   Depression    Dyslipidemia    GERD (gastroesophageal reflux disease)    Headache(784.0)    Hypertension    Obstructive sleep apnea 08/24/2022   STEMI (ST elevation myocardial infarction) ([place])    Stroke [facility])    Past Surgical History:  Procedure Laterality Date   BREAST LUMPECTOMY WITH RADIOACTIVE SEED LOCALIZATION Left 07/07/2017   Procedure: LEFT BREAST LUMPECTOMY WITH RADIOACTIVE SEED LOCALIZATION;  Surgeon: Jovita Kussmaul, MD;  Location: [place];  Service: General;  Laterality: Left;   BREAST LUMPECTOMY WITH RADIOACTIVE SEED LOCALIZATION Left 07/18/2017   Procedure: LEFT BREAST LUMPECTOMY WITH RADIOACTIVE SEED LOCALIZATION;  Surgeon: Jovita Kussmaul, MD;  Location: [place];  Service: General;  Laterality: Left;   CATARACT EXTRACTION W/PHACO  11/22/2011   Procedure: CATARACT EXTRACTION PHACO AND INTRAOCULAR LENS PLACEMENT ([place]);  Surgeon: Tonny Branch;  Location: [facility];  Service: Ophthalmology;  Laterality: Left;  CDE:7.76   CATARACT EXTRACTION W/PHACO  12/02/2011   Procedure: CATARACT EXTRACTION PHACO AND INTRAOCULAR LENS PLACEMENT ([facility]);  Surgeon: Tonny Branch;  Location: [facility];  Service: Ophthalmology;  Laterality: Right;  CDE=11.92   COLONOSCOPY     CORONARY/GRAFT ACUTE MI REVASCULARIZATION N/A 08/08/2022   Procedure: Coronary/Graft Acute MI Revascularization;  Surgeon: Martinique, Peter M, MD;  Location: [place] [facility];  Service: Cardiovascular;  Laterality: N/A;   LEFT HEART CATH AND  CORONARY ANGIOGRAPHY N/A 08/08/2022   Procedure: LEFT HEART CATH AND CORONARY ANGIOGRAPHY;  Surgeon: Martinique, Peter M, MD;  Location: [place] [facility];  Service: Cardiovascular;  Laterality: N/A;   TUBAL LIGATION      Allergies  Allergies  Allergen Reactions   Penicillins Itching and Rash         History of Present Illness    71 year old female with the above past medical history including CAD s/p STEMI, DES-RCA in 07/2022, CVA, hypertension, hyperlipidemia, GERD, and anxiety.   Previously followed by Dr. Debara Pickett. Myoview in March 2011 was low risk.  Echocardiogram at the time was normal.  She presented to the ED on 08/08/2022 with acute onset chest pain. EKG showed inferior ST elevation with reciprocal changes in anterior leads.  She was transferred to [facility] for emergent cardiac catheterization which revealed acute occlusion of the PL branch of RCA s/p DES, otherwise diffuse moderate disease and small caliber LAD and diagonal vessels, EF 55-65%. Echocardiogram showed EF 60 to 65%,  normal LV function, no RWMA, G1 DD, reduced RV systolic function, mild LA enlargement, and mild mitral valve regurgitation.  Consideration of outpatient Myoview in 3 to 6 months was recommended to reassess residual LAD stenosis and evaluate for anterior wall ischemia.  She was started on aspirin, Brilinta, low-dose metoprolol, and Crestor (previously on simvastatin). Home Benicar was held at discharge due to borderline BP.  She presented to the ED again on 08/12/2022 with reports of left arm tingling, left hip and left leg tingling and diaphoresis. MRI of the brain showed 2  small cortical infarcts in the left parietal lobe, chronic microhemorrhage in the right corona radiata, and tiny chronic infarct in the left cerebellum. MRA, carotid ultrasound, and Zio patch were recommended.  She was discharged home in stable condition.    She was last seen in the office on 08/20/2022 and was stable from a cardiac standpoint.   Carotid Doppler showed no evidence of carotid artery stenosis.  Cardiac monitor was ordered.  She was referred to neurology who recommended a sleep study. She presents today for follow-up.  Since her last visit she has been stable from a cardiac standpoint.  She does note some ongoing mild fatigue, she is participating in cardiac rehab and seems to be tolerating this well.  She denies any symptoms concerning for angina.  She is very concerned that she has residual blockages and is asking "how will I know if these are getting worse?"  Other than her ongoing fatigue, she denies any additional concerns today.  Home Medications    Current Outpatient Medications  Medication Sig Dispense Refill   acetaminophen (TYLENOL) 500 MG tablet Take 500 mg by mouth every 6 (six) hours as needed for moderate pain.     acyclovir (ZOVIRAX) 200 MG capsule Take 200 mg by mouth 2 (two) times daily.      aspirin EC 81 MG tablet Take 1 tablet (81 mg total) by mouth daily. Swallow whole. 90 tablet 3   gabapentin (NEURONTIN) 300 MG capsule Take 300 mg by mouth 3 (three) times daily.  1   meclizine (ANTIVERT) 25 MG tablet Take 1 tablet (25 mg total) by mouth 4 (four) times daily as needed for dizziness. (Patient taking differently: Take 25 mg by mouth 3 (three) times daily as needed for dizziness.) 40 tablet 0   metoprolol tartrate (LOPRESSOR) 25 MG tablet Take 1/2 tablet (12.5 mg total) by mouth 2 (two) times daily. 90 tablet 3   nitroGLYCERIN (NITROSTAT) 0.4 MG SL tablet Place 1 tablet (0.4 mg total) under the tongue every 5 (five) minutes as needed for chest pain. 25 tablet 2   OZEMPIC, 1 MG/DOSE, 4 MG/3ML SOPN Inject 1 mg into the skin once a week.     pantoprazole (PROTONIX) 40 MG tablet Take 40 mg by mouth daily.     rosuvastatin (CRESTOR) 40 MG tablet Take 1 tablet (40 mg total) by mouth daily. 90 tablet 3   ticagrelor (BRILINTA) 90 MG TABS tablet Take 1 tablet (90 mg total) by mouth 2 (two) times daily. 180 tablet 2    traZODone (DESYREL) 50 MG tablet Take 1 tablet by mouth at bedtime.     WEGOVY 1 MG/0.5ML SOAJ Inject 1 mg into the skin as directed. (Patient not taking: Reported on 11/01/2022)     No current facility-administered medications for this visit.     Review of Systems    She denies chest pain, palpitations, dyspnea, pnd, orthopnea, n, v, dizziness, syncope, edema, weight gain, or early satiety. All other systems reviewed and are otherwise negative except as noted above.   Physical Exam    VS:  BP 130/78   Pulse 72   Ht '5\' 4"'$  (1.626 m)   Wt 180 lb 6.4 oz (81.8 kg)   SpO2 94%   BMI 30.97 kg/m  GEN: Well nourished, well developed, in no acute distress. HEENT: normal. Neck: Supple, no JVD, carotid bruits, or masses. Cardiac: RRR, no murmurs, rubs, or gallops. No clubbing, cyanosis, edema.  Radials/DP/PT 2+ and equal bilaterally.  Respiratory:  Respirations  regular and unlabored, clear to auscultation bilaterally. GI: Soft, nontender, nondistended, BS + x 4. MS: no deformity or atrophy. Skin: warm and dry, no rash. Neuro:  Strength and sensation are intact. Psych: Normal affect.  Accessory Clinical Findings    ECG personally reviewed by me today -no EKG in office today.   Lab Results  Component Value Date   WBC 11.0 (H) 08/12/2022   HGB 14.1 08/12/2022   HCT 43.8 08/12/2022   MCV 92.6 08/12/2022   PLT 337 08/12/2022   Lab Results  Component Value Date   CREATININE 1.15 (H) 08/12/2022   BUN 12 08/12/2022   NA 142 08/12/2022   K 5.1 08/12/2022   CL 107 08/12/2022   CO2 26 08/12/2022   Lab Results  Component Value Date   ALT 40 (H) 10/01/2022   AST 36 10/01/2022   ALKPHOS 90 10/01/2022   BILITOT 0.5 10/01/2022   Lab Results  Component Value Date   CHOL 116 10/01/2022   HDL 39 (L) 10/01/2022   LDLCALC 54 10/01/2022   TRIG 127 10/01/2022   CHOLHDL 3.0 10/01/2022    Lab Results  Component Value Date   HGBA1C 5.7 (H) 08/08/2022    Assessment & Plan   1. CAD:  s/p STEMI, DES-RCA in 07/2022. Consideration of outpatient Myoview in 3 to 6 months was recommended to reassess residual LAD stenosis and evaluate for anterior wall ischemia, however, in the absence of symptoms this was deferred.  Stable with no anginal symptoms.  She does note some ongoing fatigue, continue to monitor symptoms. If she has persistent fatigue, could consider stress test at next follow-up.  Continue aspirin, Brilinta, metoprolol, and Crestor.   2. CVA: S/p 2 small cortical infarcts in the left parietal lobe, chronic microhemorrhage in the right corona radiata, and tiny chronic infarct in the left cerebellum. Recent echo showed EF 60 to 65%, normal LV function, no RWMA, G1 DD, reduced RV systolic function, mild LA enlargement, and mild mitral valve regurgitation.  Dopplers were unremarkable.  Cardiac monitor was ordered.  Patient states she wore the monitor for a week, the monitor fell off.  She returned to the vendor, however, they never received the monitor.  She does not wish to repeat monitor at this time.  She denies palpitations, dizziness, presyncope, or syncope. Would defer to neurology, could consider loop recorder vs 30 day monitor is she has any concerning symptoms in the future. Continue aspirin, Crestor.   3. Hypertension: BP well controlled. Continue current antihypertensive regimen.    4. Hyperlipidemia:  LDL was 54 in 09/2022.  ALT was slightly elevated on most recent labs.  We will repeat LFTs today.  Continue aspirin, Crestor.   5. Disposition: Follow-up in 3-4 months with Dr. Martinique.      Lenna Sciara, NP 11/01/2022, 12:05 PM

## 2022-11-02 LAB — HEPATIC FUNCTION PANEL
ALT: 31 IU/L (ref 0–32)
AST: 29 IU/L (ref 0–40)
Albumin: 4.5 g/dL (ref 3.8–4.8)
Alkaline Phosphatase: 85 IU/L (ref 44–121)
Bilirubin Total: 0.6 mg/dL (ref 0.0–1.2)
Bilirubin, Direct: 0.18 mg/dL (ref 0.00–0.40)
Total Protein: 7.2 g/dL (ref 6.0–8.5)

## 2022-11-03 ENCOUNTER — Ambulatory Visit (HOSPITAL_COMMUNITY): Payer: TRICARE For Life (TFL)

## 2022-11-03 ENCOUNTER — Encounter (HOSPITAL_COMMUNITY)
Admission: RE | Admit: 2022-11-03 | Discharge: 2022-11-03 | Disposition: A | Discharge status: Home / Self Care | Payer: Medicare HMO | Source: Ambulatory Visit | Attending: Cardiology | Admitting: Cardiology

## 2022-11-03 DIAGNOSIS — Z955 Presence of coronary angioplasty implant and graft: Secondary | ICD-10-CM

## 2022-11-03 DIAGNOSIS — I2111 ST elevation (STEMI) myocardial infarction involving right coronary artery: Secondary | ICD-10-CM

## 2022-11-03 NOTE — Progress Notes (Signed)
Cardiac Individual Treatment Plan  Patient Details  Name: Katherine Short MRN: [id number] Date of Birth: 10-20-1951 Referring Provider:   Flowsheet Row CARDIAC REHAB PHASE II ORIENTATION from 09/09/2022 in [place]  Referring Provider Dr. Martinique       Initial Encounter Date:  Flowsheet Row CARDIAC REHAB PHASE II ORIENTATION from 09/09/2022 in [place]  Date 09/09/22       Visit Diagnosis: ST elevation myocardial infarction involving right coronary artery [facility])  Status post coronary artery stent placement  Patient's Home Medications on Admission:  Current Outpatient Medications:    acetaminophen (TYLENOL) 500 MG tablet, Take 500 mg by mouth every 6 (six) hours as needed for moderate pain., Disp: , Rfl:    acyclovir (ZOVIRAX) 200 MG capsule, Take 200 mg by mouth 2 (two) times daily. , Disp: , Rfl:    aspirin EC 81 MG tablet, Take 1 tablet (81 mg total) by mouth daily. Swallow whole., Disp: 90 tablet, Rfl: 3   gabapentin (NEURONTIN) 300 MG capsule, Take 300 mg by mouth 3 (three) times daily., Disp: , Rfl: 1   meclizine (ANTIVERT) 25 MG tablet, Take 1 tablet (25 mg total) by mouth 4 (four) times daily as needed for dizziness. (Patient taking differently: Take 25 mg by mouth 3 (three) times daily as needed for dizziness.), Disp: 40 tablet, Rfl: 0   metoprolol tartrate (LOPRESSOR) 25 MG tablet, Take 1/2 tablet (12.5 mg total) by mouth 2 (two) times daily., Disp: 90 tablet, Rfl: 3   nitroGLYCERIN (NITROSTAT) 0.4 MG SL tablet, Place 1 tablet (0.4 mg total) under the tongue every 5 (five) minutes as needed for chest pain., Disp: 25 tablet, Rfl: 2   OZEMPIC, 1 MG/DOSE, 4 MG/3ML SOPN, Inject 1 mg into the skin once a week., Disp: , Rfl:    pantoprazole (PROTONIX) 40 MG tablet, Take 40 mg by mouth daily., Disp: , Rfl:    rosuvastatin (CRESTOR) 40 MG tablet, Take 1 tablet (40 mg total) by mouth daily., Disp: 90 tablet, Rfl: 3   ticagrelor  (BRILINTA) 90 MG TABS tablet, Take 1 tablet (90 mg total) by mouth 2 (two) times daily., Disp: 180 tablet, Rfl: 2   traZODone (DESYREL) 50 MG tablet, Take 1 tablet by mouth at bedtime., Disp: , Rfl:    WEGOVY 1 MG/0.5ML SOAJ, Inject 1 mg into the skin as directed. (Patient not taking: Reported on 11/01/2022), Disp: , Rfl:   Past Medical History: Past Medical History:  Diagnosis Date   Anxiety    Chest pain 03/19/2010   nuclear study was negative   Depression    Dyslipidemia    GERD (gastroesophageal reflux disease)    Headache(784.0)    Hypertension    Obstructive sleep apnea 08/24/2022   STEMI (ST elevation myocardial infarction) ([place])    Stroke ([place])     Tobacco Use: Social History   Tobacco Use  Smoking Status Never  Smokeless Tobacco Never    Labs: Review Flowsheet       Latest Ref Rng & Units 05/25/2013 08/14/2014 08/08/2022 10/01/2022  Labs for ITP Cardiac and Pulmonary Rehab  Cholestrol 100 - 199 mg/dL 166  180  133  116   LDL (calc) 0 - 99 mg/dL 104  107  56  54   HDL-C >39 mg/dL 47  52  39  39   Trlycerides 0 - 149 mg/dL 76  106  188  127   Hemoglobin A1c 4.8 - 5.6 % - - 5.7  -  Capillary Blood Glucose: Lab Results  Component Value Date   GLUCAP 107 (H) 09/17/2022   GLUCAP 120 (H) 09/15/2022   GLUCAP 113 (H) 09/13/2022   GLUCAP 138 (H) 09/09/2022   GLUCAP 144 (H) 08/09/2022    POCT Glucose     Row Name 09/09/22 0833             POCT Blood Glucose   Pre-Exercise 138 mg/dL                Exercise Target Goals: Exercise Program Goal: Individual exercise prescription set using results from initial 6 min walk test and THRR while considering  patient's activity barriers and safety.   Exercise Prescription Goal: Starting with aerobic activity 30 plus minutes a day, 3 days per week for initial exercise prescription. Provide home exercise prescription and guidelines that participant acknowledges understanding prior to discharge.  Activity  Barriers & Risk Stratification:  Activity Barriers & Cardiac Risk Stratification - 09/09/22 0854       Activity Barriers & Cardiac Risk Stratification   Activity Barriers Arthritis;Neck/Spine Problems;Balance Concerns;History of Falls    Cardiac Risk Stratification High             6 Minute Walk:  6 Minute Walk     Row Name 09/09/22 0959         6 Minute Walk   Phase Initial     Distance 1400 feet     Walk Time 6 minutes     # of Rest Breaks 0     MPH 2.65     METS 2.71     RPE 11     VO2 Peak 9.49     Symptoms No     Resting HR 79 bpm     Resting BP 116/80     Resting Oxygen Saturation  95 %     Exercise Oxygen Saturation  during 6 min walk 98 %     Max Ex. HR 95 bpm     Max Ex. BP 130/70     2 Minute Post BP 114/70              Oxygen Initial Assessment:   Oxygen Re-Evaluation:   Oxygen Discharge (Final Oxygen Re-Evaluation):   Initial Exercise Prescription:  Initial Exercise Prescription - 09/09/22 1000       Date of Initial Exercise RX and Referring Provider   Date 09/09/22    Referring Provider Dr. Martinique    Expected Discharge Date 12/03/22      Treadmill   MPH 1.7    Grade 0    Minutes 17      NuStep   Level 1    SPM 60    Minutes 22      Prescription Details   Frequency (times per week) 3    Duration Progress to 30 minutes of continuous aerobic without signs/symptoms of physical distress      Intensity   THRR 40-80% of Max Heartrate 60-119    Ratings of Perceived Exertion 11-13      Resistance Training   Training Prescription Yes    Weight 3    Reps 10-15             Perform Capillary Blood Glucose checks as needed.  Exercise Prescription Changes:   Exercise Prescription Changes     Row Name 09/22/22 1000 10/01/22 0815 10/18/22 1000 10/22/22 0900 11/01/22 1000     Response to Exercise   Blood Pressure (Admit) 120/68 104/64  100/60 -- 118/70   Blood Pressure (Exercise) 128/62 120/60 120/60 -- 112/60   Blood  Pressure (Exit) 122/80 100/60 110/68 -- 112/62   Heart Rate (Admit) 76 bpm 73 bpm 75 bpm -- 72 bpm   Heart Rate (Exercise) 101 bpm 110 bpm 108 bpm -- 102 bpm   Heart Rate (Exit) 85 bpm 82 bpm 74 bpm -- 81 bpm   Rating of Perceived Exertion (Exercise) '12 12 12 '$ -- 13   Duration Continue with 30 min of aerobic exercise without signs/symptoms of physical distress. Continue with 30 min of aerobic exercise without signs/symptoms of physical distress. Continue with 30 min of aerobic exercise without signs/symptoms of physical distress. -- Continue with 30 min of aerobic exercise without signs/symptoms of physical distress.   Intensity THRR unchanged THRR unchanged THRR unchanged -- THRR unchanged     Progression   Progression Continue to progress workloads to maintain intensity without signs/symptoms of physical distress. Continue to progress workloads to maintain intensity without signs/symptoms of physical distress. Continue to progress workloads to maintain intensity without signs/symptoms of physical distress. -- Continue to progress workloads to maintain intensity without signs/symptoms of physical distress.     Resistance Training   Training Prescription Yes Yes Yes -- Yes   Weight '3 4 4 '$ -- 4   Reps 10-15 10-15 10-15 -- 10-15   Time 10 Minutes 10 Minutes 10 Minutes -- 10 Minutes     Treadmill   MPH 2.1 2.2 2.4 -- 2.5   Grade 0 0 0.5 -- 0.5   Minutes '17 17 17 '$ -- 17   METs 2.61 2.69 3 -- 3.09     NuStep   Level '1 1 2 '$ -- 2   SPM 107 114 112 -- 123   Minutes '22 22 2 '$ -- 22   METs 2.49 2.83 2.9 -- 3.44     Home Exercise Plan   Plans to continue exercise at -- -- -- Home (comment) --   Frequency -- -- -- Add 2 additional days to program exercise sessions. --   Initial Home Exercises Provided -- -- -- 10/22/22 --            Exercise Comments:   Exercise Comments     Row Name 10/22/22 0915           Exercise Comments Home exercise reviewd                Exercise Goals  and Review:   Exercise Goals     Row Name 09/09/22 1001 10/04/22 1043 11/01/22 1030         Exercise Goals   Increase Physical Activity Yes Yes Yes     Intervention Provide advice, education, support and counseling about physical activity/exercise needs.;Develop an individualized exercise prescription for aerobic and resistive training based on initial evaluation findings, risk stratification, comorbidities and participant's personal goals. Provide advice, education, support and counseling about physical activity/exercise needs.;Develop an individualized exercise prescription for aerobic and resistive training based on initial evaluation findings, risk stratification, comorbidities and participant's personal goals. Provide advice, education, support and counseling about physical activity/exercise needs.;Develop an individualized exercise prescription for aerobic and resistive training based on initial evaluation findings, risk stratification, comorbidities and participant's personal goals.     Expected Outcomes Short Term: Attend rehab on a regular basis to increase amount of physical activity.;Long Term: Add in home exercise to make exercise part of routine and to increase amount of physical activity.;Long Term: Exercising regularly at least 3-5 days a  week. Short Term: Attend rehab on a regular basis to increase amount of physical activity.;Long Term: Add in home exercise to make exercise part of routine and to increase amount of physical activity.;Long Term: Exercising regularly at least 3-5 days a week. Short Term: Attend rehab on a regular basis to increase amount of physical activity.;Long Term: Add in home exercise to make exercise part of routine and to increase amount of physical activity.;Long Term: Exercising regularly at least 3-5 days a week.     Increase Strength and Stamina Yes Yes Yes     Intervention Provide advice, education, support and counseling about physical activity/exercise  needs.;Develop an individualized exercise prescription for aerobic and resistive training based on initial evaluation findings, risk stratification, comorbidities and participant's personal goals. Provide advice, education, support and counseling about physical activity/exercise needs.;Develop an individualized exercise prescription for aerobic and resistive training based on initial evaluation findings, risk stratification, comorbidities and participant's personal goals. Provide advice, education, support and counseling about physical activity/exercise needs.;Develop an individualized exercise prescription for aerobic and resistive training based on initial evaluation findings, risk stratification, comorbidities and participant's personal goals.     Expected Outcomes Short Term: Increase workloads from initial exercise prescription for resistance, speed, and METs.;Short Term: Perform resistance training exercises routinely during rehab and add in resistance training at home;Long Term: Improve cardiorespiratory fitness, muscular endurance and strength as measured by increased METs and functional capacity (6MWT) Short Term: Increase workloads from initial exercise prescription for resistance, speed, and METs.;Short Term: Perform resistance training exercises routinely during rehab and add in resistance training at home;Long Term: Improve cardiorespiratory fitness, muscular endurance and strength as measured by increased METs and functional capacity (6MWT) Short Term: Increase workloads from initial exercise prescription for resistance, speed, and METs.;Short Term: Perform resistance training exercises routinely during rehab and add in resistance training at home;Long Term: Improve cardiorespiratory fitness, muscular endurance and strength as measured by increased METs and functional capacity (6MWT)     Able to understand and use rate of perceived exertion (RPE) scale Yes Yes Yes     Intervention Provide education  and explanation on how to use RPE scale Provide education and explanation on how to use RPE scale Provide education and explanation on how to use RPE scale     Expected Outcomes Short Term: Able to use RPE daily in rehab to express subjective intensity level;Long Term:  Able to use RPE to guide intensity level when exercising independently Short Term: Able to use RPE daily in rehab to express subjective intensity level;Long Term:  Able to use RPE to guide intensity level when exercising independently Short Term: Able to use RPE daily in rehab to express subjective intensity level;Long Term:  Able to use RPE to guide intensity level when exercising independently     Knowledge and understanding of Target Heart Rate Range (THRR) Yes Yes Yes     Intervention Provide education and explanation of THRR including how the numbers were predicted and where they are located for reference Provide education and explanation of THRR including how the numbers were predicted and where they are located for reference Provide education and explanation of THRR including how the numbers were predicted and where they are located for reference     Expected Outcomes Short Term: Able to state/look up THRR;Short Term: Able to use daily as guideline for intensity in rehab;Long Term: Able to use THRR to govern intensity when exercising independently Short Term: Able to state/look up THRR;Short Term: Able to use daily  as guideline for intensity in rehab;Long Term: Able to use THRR to govern intensity when exercising independently Short Term: Able to state/look up THRR;Short Term: Able to use daily as guideline for intensity in rehab;Long Term: Able to use THRR to govern intensity when exercising independently     Able to check pulse independently Yes Yes Yes     Intervention Provide education and demonstration on how to check pulse in carotid and radial arteries.;Review the importance of being able to check your own pulse for safety during  independent exercise Provide education and demonstration on how to check pulse in carotid and radial arteries.;Review the importance of being able to check your own pulse for safety during independent exercise Provide education and demonstration on how to check pulse in carotid and radial arteries.;Review the importance of being able to check your own pulse for safety during independent exercise     Expected Outcomes Short Term: Able to explain why pulse checking is important during independent exercise;Long Term: Able to check pulse independently and accurately Short Term: Able to explain why pulse checking is important during independent exercise;Long Term: Able to check pulse independently and accurately Short Term: Able to explain why pulse checking is important during independent exercise;Long Term: Able to check pulse independently and accurately     Understanding of Exercise Prescription Yes Yes Yes     Intervention Provide education, explanation, and written materials on patient's individual exercise prescription Provide education, explanation, and written materials on patient's individual exercise prescription Provide education, explanation, and written materials on patient's individual exercise prescription     Expected Outcomes Short Term: Able to explain program exercise prescription;Long Term: Able to explain home exercise prescription to exercise independently Short Term: Able to explain program exercise prescription;Long Term: Able to explain home exercise prescription to exercise independently Short Term: Able to explain program exercise prescription;Long Term: Able to explain home exercise prescription to exercise independently              Exercise Goals Re-Evaluation :  Exercise Goals Re-Evaluation     Row Name 10/04/22 1044 11/01/22 1031           Exercise Goal Re-Evaluation   Exercise Goals Review Increase Physical Activity;Increase Strength and Stamina;Able to understand  and use rate of perceived exertion (RPE) scale;Knowledge and understanding of Target Heart Rate Range (THRR);Able to check pulse independently;Understanding of Exercise Prescription Increase Physical Activity;Increase Strength and Stamina;Able to understand and use rate of perceived exertion (RPE) scale;Knowledge and understanding of Target Heart Rate Range (THRR);Able to check pulse independently;Understanding of Exercise Prescription      Comments Pt has completed 10 sessions of cardiac rehab. She has been able to progress her workloads and is progressing well in the program. She seems to enjoy the classes and is exercising at home on her off days. She is currently exercising at 2.83 METs on the stepper. Will continue to monitor and progress as able. Pt has completed 21 session of cardiac rehab. She continues to increase her workloads on the treadmill and stepper. She is going to water aerobics twice a week outside of rehab. She is curretnly exercising at 3.44 METs on  the stepper. Will continue to monitor and progress as able.      Expected Outcomes Through exercise at rehab and home, the patient will meet their stated goals. Through exercise at rehab and home, the patient will meet their stated goals.  Discharge Exercise Prescription (Final Exercise Prescription Changes):  Exercise Prescription Changes - 11/01/22 1000       Response to Exercise   Blood Pressure (Admit) 118/70    Blood Pressure (Exercise) 112/60    Blood Pressure (Exit) 112/62    Heart Rate (Admit) 72 bpm    Heart Rate (Exercise) 102 bpm    Heart Rate (Exit) 81 bpm    Rating of Perceived Exertion (Exercise) 13    Duration Continue with 30 min of aerobic exercise without signs/symptoms of physical distress.    Intensity THRR unchanged      Progression   Progression Continue to progress workloads to maintain intensity without signs/symptoms of physical distress.      Resistance Training   Training  Prescription Yes    Weight 4    Reps 10-15    Time 10 Minutes      Treadmill   MPH 2.5    Grade 0.5    Minutes 17    METs 3.09      NuStep   Level 2    SPM 123    Minutes 22    METs 3.44             Nutrition:  Target Goals: Understanding of nutrition guidelines, daily intake of sodium '1500mg'$ , cholesterol '200mg'$ , calories 30% from fat and 7% or less from saturated fats, daily to have 5 or more servings of fruits and vegetables.  Biometrics:  Pre Biometrics - 09/09/22 1002       Pre Biometrics   Height '5\' 4"'$  (1.626 m)    Weight 82.4 kg    Waist Circumference 38 inches    Hip Circumference 43.5 inches    Waist to Hip Ratio 0.87 %    BMI (Calculated) 31.17    Triceps Skinfold 24 mm    % Body Fat 41.3 %    Grip Strength 23.5 kg    Flexibility 8 in    Single Leg Stand 11.8 seconds              Nutrition Therapy Plan and Nutrition Goals:  Nutrition Therapy & Goals - 09/27/22 0914       Personal Nutrition Goals   Comments Patient scored 42 on her diet assessment. We offer 2 educational sessions on heart healthy nutrition with handouts and assistance with RD referral if patient is interested.      Intervention Plan   Intervention Nutrition handout(s) given to patient.    Expected Outcomes Long Term Goal: Adherence to prescribed nutrition plan.             Nutrition Assessments:  Nutrition Assessments - 09/09/22 0857       MEDFICTS Scores   Pre Score 42            MEDIFICTS Score Key: ?70 Need to make dietary changes  40-70 Heart Healthy Diet ? 40 Therapeutic Level Cholesterol Diet   Picture Your Plate Scores: <67 Unhealthy dietary pattern with much room for improvement. 41-50 Dietary pattern unlikely to meet recommendations for good health and room for improvement. 51-60 More healthful dietary pattern, with some room for improvement.  >60 Healthy dietary pattern, although there may be some specific behaviors that could be improved.     Nutrition Goals Re-Evaluation:   Nutrition Goals Discharge (Final Nutrition Goals Re-Evaluation):   Psychosocial: Target Goals: Acknowledge presence or absence of significant depression and/or stress, maximize coping skills, provide positive support system. Participant is able to verbalize types and ability to  use techniques and skills needed for reducing stress and depression.  Initial Review & Psychosocial Screening:  Initial Psych Review & Screening - 09/09/22 0921       Initial Review   Current issues with Current Sleep Concerns;Current Stress Concerns;History of Depression    Source of Stress Concerns Chronic Illness    Comments Due to a heart attack and a CVA within a couple of weeks of each other      [place]? Yes    Comments Her sisters and her husband are her main support system.      Barriers   Psychosocial barriers to participate in program Psychosocial barriers identified (see note)      Screening Interventions   Interventions Encouraged to exercise    Expected Outcomes Long Term goal: The participant improves quality of Life and PHQ9 Scores as seen by post scores and/or verbalization of changes;Short Term goal: Identification and review with participant of any Quality of Life or Depression concerns found by scoring the questionnaire.             Quality of Life Scores:  Quality of Life - 09/09/22 1002       Quality of Life   Select Quality of Life      Quality of Life Scores   Health/Function Pre 19.53 %    Socioeconomic Pre 29.63 %    Psych/Spiritual Pre 17.93 %    Family Pre 21 %    GLOBAL Pre 20.81 %            Scores of 19 and below usually indicate a poorer quality of life in these areas.  A difference of  2-3 points is a clinically meaningful difference.  A difference of 2-3 points in the total score of the Quality of Life Index has been associated with significant improvement in overall quality of life,  self-image, physical symptoms, and general health in studies assessing change in quality of life.  PHQ-9: Review Flowsheet       09/09/2022 06/17/2021  Depression screen PHQ 2/9  Decreased Interest 3 1  Down, Depressed, Hopeless 3 1  PHQ - 2 Score 6 2  Altered sleeping 3 1  Tired, decreased energy 3 1  Change in appetite 3 1  Feeling bad or failure about yourself  3 0  Trouble concentrating 3 0  Moving slowly or fidgety/restless 2 0  Suicidal thoughts 0 0  PHQ-9 Score 23 5  Difficult doing work/chores Somewhat difficult Somewhat difficult   Interpretation of Total Score  Total Score Depression Severity:  1-4 = Minimal depression, 5-9 = Mild depression, 10-14 = Moderate depression, 15-19 = Moderately severe depression, 20-27 = Severe depression   Psychosocial Evaluation and Intervention:  Psychosocial Evaluation - 09/09/22 9150       Psychosocial Evaluation & Interventions   Interventions Encouraged to exercise with the program and follow exercise prescription    Comments Pt has no barriers to partcipating in CR. She has a history of depression and formerly took Bupropion. She scored a 23 on her PHQ-9, but she denies having depression currently. She states that her current state is due to her health problems. She has a STEMI and a CVA within two weeks of each other. She feels like she does not have any energy, so she does not feel like doing anything. Her current health concerns are a source of stress for her. She also has sleep concerns and she formerly took Trazodone, but she  reports not taking anything currently. Dr. Krista Blue, from [facility], suspects that her altered sleep may be due to underlying OSA, so she has a sleep study scheduled. She denies any thoughts of harming herself, and overall has a positive outlook despite her high PHQ-9 score. She reports that she has a good support system with her husband and her sisters. Her goals while in the program are to improve  her energy levels and to improve her sleep. She has already begun taking aqua aerobics classes at the [facility], and she looks forward to participating in CR as well.    Expected Outcomes Pt's depression and stress related symptoms will reduce as she continues to recover from her STEMI and CVA. Her underlying sleep issues will be determined and treated.    Continue Psychosocial Services  Follow up required by staff             Psychosocial Re-Evaluation:  Psychosocial Re-Evaluation     [place] Name 09/27/22 [id number] 10/25/22 0942           Psychosocial Re-Evaluation   Current issues with None Identified Current Sleep Concerns;Current Stress Concerns      Comments Patient is new to the program completed 7 sessions. She continues to have no psychosocial barriers identified. Patietn continues to take trazodone for sleep. She seems to enjoy the sessions and demonstrates an interest in improving her health. We will continue to monitor. Patient has completed 17 sessions. She continues to have no psychosocial barriers identified. Patient saw a neurologist 10/9 and she advises her to do a sleep study. She prescribed Melotonin for sleep titrating the dose. She continues to enjoy the sessions and demonstrates an interest in improving her health. We will continue to monitor.      Expected Outcomes Patient continues to have no psychosocial barriers identified and her sleep will continue to be managed. Patient continues to have no psychosocial barriers identified and her sleep will continue to be managed.      Interventions Encouraged to attend Cardiac Rehabilitation for the exercise;Stress management education;Relaxation education Encouraged to attend Cardiac Rehabilitation for the exercise;Stress management education;Relaxation education      Continue Psychosocial Services  No Follow up required No Follow up required        Initial Review   Source of Stress Concerns -- Chronic Illness      Comments -- Due to a  heart attack and a CVA within a couple of weeks of each other               Psychosocial Discharge (Final Psychosocial Re-Evaluation):  Psychosocial Re-Evaluation - 10/25/22 0942       Psychosocial Re-Evaluation   Current issues with Current Sleep Concerns;Current Stress Concerns    Comments Patient has completed 17 sessions. She continues to have no psychosocial barriers identified. Patient saw a neurologist 10/9 and she advises her to do a sleep study. She prescribed Melotonin for sleep titrating the dose. She continues to enjoy the sessions and demonstrates an interest in improving her health. We will continue to monitor.    Expected Outcomes Patient continues to have no psychosocial barriers identified and her sleep will continue to be managed.    Interventions Encouraged to attend Cardiac Rehabilitation for the exercise;Stress management education;Relaxation education    Continue Psychosocial Services  No Follow up required      Initial Review   Source of Stress Concerns Chronic Illness    Comments Due to a heart attack and a CVA  within a couple of weeks of each other             Vocational Rehabilitation: Provide vocational rehab assistance to qualifying candidates.   Vocational Rehab Evaluation & Intervention:  Vocational Rehab - 09/09/22 0835       Initial Vocational Rehab Evaluation & Intervention   Assessment shows need for Vocational Rehabilitation No      Vocational Rehab Re-Evaulation   Comments She is retired             Education: Education Goals: Education classes will be provided on a weekly basis, covering required topics. Participant will state understanding/return demonstration of topics presented.  Learning Barriers/Preferences:  Learning Barriers/Preferences - 09/09/22 0835       Learning Barriers/Preferences   Learning Barriers None    Learning Preferences Audio             Education Topics: Hypertension, Hypertension  Reduction -Define heart disease and high blood pressure. Discus how high blood pressure affects the body and ways to reduce high blood pressure. Flowsheet Row CARDIAC REHAB PHASE II EXERCISE from 10/27/2022 in [place]  Date 09/29/22  Educator hj  Instruction Review Code 1- Verbalizes Understanding       Exercise and Your Heart -Discuss why it is important to exercise, the FITT principles of exercise, normal and abnormal responses to exercise, and how to exercise safely. Flowsheet Row CARDIAC REHAB PHASE II EXERCISE from 10/27/2022 in [place]  Date 10/06/22  Educator [place]  Instruction Review Code 1- Verbalizes Understanding       Angina -Discuss definition of angina, causes of angina, treatment of angina, and how to decrease risk of having angina. Flowsheet Row CARDIAC REHAB PHASE II EXERCISE from 10/27/2022 in [place]  Date 10/15/22  Educator DF  Instruction Review Code 2- Demonstrated Understanding       Cardiac Medications -Review what the following cardiac medications are used for, how they affect the body, and side effects that may occur when taking the medications.  Medications include Aspirin, Beta blockers, calcium channel blockers, ACE Inhibitors, angiotensin receptor blockers, diuretics, digoxin, and antihyperlipidemics. Flowsheet Row CARDIAC REHAB PHASE II EXERCISE from 10/27/2022 in [place]  Date 10/20/22  Educator DF  Instruction Review Code 1- Verbalizes Understanding       Congestive Heart Failure -Discuss the definition of CHF, how to live with CHF, the signs and symptoms of CHF, and how keep track of weight and sodium intake. Flowsheet Row CARDIAC REHAB PHASE II EXERCISE from 10/27/2022 in [place]  Date 10/27/22  Educator HB  Instruction Review Code 1- Verbalizes Understanding       Heart Disease and Intimacy -Discus the effect  sexual activity has on the heart, how changes occur during intimacy as we age, and safety during sexual activity.   Smoking Cessation / COPD -Discuss different methods to quit smoking, the health benefits of quitting smoking, and the definition of COPD.   Nutrition I: Fats -Discuss the types of cholesterol, what cholesterol does to the heart, and how cholesterol levels can be controlled.   Nutrition II: Labels -Discuss the different components of food labels and how to read food label   Heart Parts/Heart Disease and PAD -Discuss the anatomy of the heart, the pathway of blood circulation through the heart, and these are affected by heart disease.   Stress I: Signs and Symptoms -Discuss the causes of stress, how stress may lead to anxiety  and depression, and ways to limit stress. Flowsheet Row CARDIAC REHAB PHASE II EXERCISE from 10/27/2022 in [place]  Date 09/15/22  Educator DF  Instruction Review Code 1- Verbalizes Understanding       Stress II: Relaxation -Discuss different types of relaxation techniques to limit stress. Flowsheet Row CARDIAC REHAB PHASE II EXERCISE from 10/27/2022 in [place]  Date 09/22/22  Educator DF  Instruction Review Code 2- Demonstrated Understanding       Warning Signs of Stroke / TIA -Discuss definition of a stroke, what the signs and symptoms are of a stroke, and how to identify when someone is having stroke.   Knowledge Questionnaire Score:  Knowledge Questionnaire Score - 09/09/22 0834       Knowledge Questionnaire Score   Pre Score 19/24             Core Components/Risk Factors/Patient Goals at Admission:  Personal Goals and Risk Factors at Admission - 09/09/22 0900       Core Components/Risk Factors/Patient Goals on Admission   Hypertension Yes    Intervention Provide education on lifestyle modifcations including regular physical activity/exercise, weight management,  moderate sodium restriction and increased consumption of fresh fruit, vegetables, and low fat dairy, alcohol moderation, and smoking cessation.;Monitor prescription use compliance.    Expected Outcomes Short Term: Continued assessment and intervention until BP is < 140/31m HG in hypertensive participants. < 130/833mHG in hypertensive participants with diabetes, heart failure or chronic kidney disease.;Long Term: Maintenance of blood pressure at goal levels.    Lipids Yes    Intervention Provide education and support for participant on nutrition & aerobic/resistive exercise along with prescribed medications to achieve LDL '70mg'$ , HDL >'40mg'$ .    Expected Outcomes Short Term: Participant states understanding of desired cholesterol values and is compliant with medications prescribed. Participant is following exercise prescription and nutrition guidelines.;Long Term: Cholesterol controlled with medications as prescribed, with individualized exercise RX and with personalized nutrition plan. Value goals: LDL < '70mg'$ , HDL > 40 mg.    Personal Goal Other Yes    Personal Goal Improve energy levels and improve sleeping.    Intervention Attend CR three days per week and continue to exercise at home 2-4 days per week.    Expected Outcomes Pt will meet stated goals.             Core Components/Risk Factors/Patient Goals Review:   Goals and Risk Factor Review     Row Name 09/27/22 0910             Core Components/Risk Factors/Patient Goals Review   Personal Goals Review Hypertension;Lipids;Other       Review Patient was referred to CR with STEMI and stent placement. She has multiple risk factors for CAD and is participating in the program for risk modification. She is new to the program completing 7 sessions. Her current weight is 178.9 down 2.1 lbs from her initial weight. She is doing well in the program. Her blood pressure is well controlled. Her personal goals for the program are to improve her energy  levels and her sleep. We will continue to monitor her progress as she works towards [employer] goals.       Expected Outcomes Patient will complete the program meeting both persona and program goals.                Core Components/Risk Factors/Patient Goals at Discharge (Final Review):   Goals and Risk Factor Review - 09/27/22 091829  Core Components/Risk Factors/Patient Goals Review   Personal Goals Review Hypertension;Lipids;Other    Review Patient was referred to CR with STEMI and stent placement. She has multiple risk factors for CAD and is participating in the program for risk modification. She is new to the program completing 7 sessions. Her current weight is 178.9 down 2.1 lbs from her initial weight. She is doing well in the program. Her blood pressure is well controlled. Her personal goals for the program are to improve her energy levels and her sleep. We will continue to monitor her progress as she works towards [employer] these goals.    Expected Outcomes Patient will complete the program meeting both persona and program goals.             ITP Comments:   Comments: ITP REVIEW Pt is making expected progress toward Cardiac Rehab goals after completing 21 sessions. Recommend continued exercise, life style modification, education, and increased stamina and strength.

## 2022-11-03 NOTE — Progress Notes (Signed)
Daily Session Note  Patient Details  Name: Katherine Short MRN: [id number] Date of Birth: [birth year] Referring Provider:   Flowsheet Row CARDIAC REHAB PHASE II ORIENTATION from 09/09/2022 in [place]  Referring Provider Dr. Martinique       Encounter Date: 11/03/2022  Check In:  Session Check In - 11/03/22 0815       Check-In   Supervising physician immediately available to respond to emergencies [facility] MD immediately available    Physician(s) Dr. Dellia Cloud    Location [facility]    Staff Present Hoy Register MHA, MS, ACSM-CEP;Leana Roe, BS, Exercise Physiologist    Virtual Visit No    Medication changes reported     No    Fall or balance concerns reported    Yes    Comments She has fallen twice this year, with LOC with one fall.    Tobacco Cessation No Change    Warm-up and Cool-down Performed as group-led instruction    Resistance Training Performed Yes    VAD Patient? No    PAD/SET Patient? No      Pain Assessment   Currently in Pain? No/denies    Multiple Pain Sites No             Capillary Blood Glucose: No results found for this or any previous visit (from the past 24 hour(s)).    Social History   Tobacco Use  Smoking Status Never  Smokeless Tobacco Never    Goals Met:  Independence with exercise equipment Exercise tolerated well No report of concerns or symptoms today Strength training completed today  Goals Unmet:  Not Applicable  Comments: checkout time is 0915   Dr. Carlyle Dolly is Medical Director for [facility]

## 2022-11-05 ENCOUNTER — Ambulatory Visit (HOSPITAL_COMMUNITY): Payer: TRICARE For Life (TFL)

## 2022-11-05 ENCOUNTER — Encounter (HOSPITAL_COMMUNITY)
Admission: RE | Admit: 2022-11-05 | Discharge: 2022-11-05 | Disposition: A | Discharge status: Home / Self Care | Payer: Medicare HMO | Source: Ambulatory Visit | Attending: Cardiology | Admitting: Cardiology

## 2022-11-05 DIAGNOSIS — I2111 ST elevation (STEMI) myocardial infarction involving right coronary artery: Secondary | ICD-10-CM

## 2022-11-05 DIAGNOSIS — Z955 Presence of coronary angioplasty implant and graft: Secondary | ICD-10-CM

## 2022-11-05 NOTE — Progress Notes (Signed)
Daily Session Note  Patient Details  Name: Katherine Short MRN: [id number] Date of Birth: 06/13/1951 Referring Provider:   Flowsheet Row CARDIAC REHAB PHASE II ORIENTATION from 09/09/2022 in [place]  Referring Provider Dr. Martinique       Encounter Date: 11/05/2022  Check In:  Session Check In - 11/05/22 0815       Check-In   Supervising physician immediately available to respond to emergencies [facility] MD immediately available    Physician(s) Dr. Dellia Cloud    Location [facility]    Staff Present Daphyne Hassell Done, RN, BSN;Rita Vialpando Sherrie George, MS, ACSM-CEP    Virtual Visit No    Medication changes reported     No    Fall or balance concerns reported    Yes    Comments She has fallen twice this year, with LOC with one fall.    Tobacco Cessation No Change    Warm-up and Cool-down Performed as group-led instruction    Resistance Training Performed Yes    VAD Patient? No    PAD/SET Patient? No      Pain Assessment   Currently in Pain? No/denies    Multiple Pain Sites No             Capillary Blood Glucose: No results found for this or any previous visit (from the past 24 hour(s)).    Social History   Tobacco Use  Smoking Status Never  Smokeless Tobacco Never    Goals Met:  Independence with exercise equipment Exercise tolerated well No report of concerns or symptoms today Strength training completed today  Goals Unmet:  Not Applicable  Comments: checkout time is 0915   Dr. Carlyle Dolly is Medical Director for [facility]

## 2022-11-08 ENCOUNTER — Ambulatory Visit (HOSPITAL_COMMUNITY): Payer: TRICARE For Life (TFL)

## 2022-11-08 ENCOUNTER — Encounter (HOSPITAL_COMMUNITY)
Admission: RE | Admit: 2022-11-08 | Discharge: 2022-11-08 | Disposition: A | Discharge status: Home / Self Care | Payer: Medicare HMO | Source: Ambulatory Visit | Attending: Cardiology | Admitting: Cardiology

## 2022-11-08 DIAGNOSIS — I2111 ST elevation (STEMI) myocardial infarction involving right coronary artery: Secondary | ICD-10-CM

## 2022-11-08 DIAGNOSIS — Z955 Presence of coronary angioplasty implant and graft: Secondary | ICD-10-CM

## 2022-11-08 NOTE — Progress Notes (Signed)
Daily Session Note  Patient Details  Name: Katherine Short MRN: [id number] Date of Birth: May 25, 1951 Referring Provider:   Flowsheet Row CARDIAC REHAB PHASE II ORIENTATION from 09/09/2022 in [place]  Referring Provider Dr. Martinique       Encounter Date: 11/08/2022  Check In:  Session Check In - 11/08/22 0815       Check-In   Supervising physician immediately available to respond to emergencies [facility] immediately available    Physician(s) Dr. Harl Bowie    Location [facility]    Staff Present Leana Roe, BS, Exercise Physiologist;Daphyne Hassell Done, RN, BSN    Virtual Visit No    Medication changes reported     No    Fall or balance concerns reported    Yes    Comments She has fallen twice this year, with LOC with one fall.    Tobacco Cessation No Change    Warm-up and Cool-down Performed as group-led instruction    Resistance Training Performed Yes    VAD Patient? No    PAD/SET Patient? No      Pain Assessment   Currently in Pain? No/denies    Multiple Pain Sites No             Capillary Blood Glucose: No results found for this or any previous visit (from the past 24 hour(s)).    Social History   Tobacco Use  Smoking Status Never  Smokeless Tobacco Never    Goals Met:  Independence with exercise equipment Exercise tolerated well No report of concerns or symptoms today Strength training completed today  Goals Unmet:  Not Applicable  Comments: check out 0915   Dr. Carlyle Dolly is Medical Director for [place]

## 2022-11-10 ENCOUNTER — Ambulatory Visit (HOSPITAL_COMMUNITY): Payer: TRICARE For Life (TFL)

## 2022-11-10 ENCOUNTER — Encounter (HOSPITAL_COMMUNITY)
Admission: RE | Admit: 2022-11-10 | Discharge: 2022-11-10 | Disposition: A | Discharge status: Home / Self Care | Payer: Medicare HMO | Source: Ambulatory Visit | Attending: Cardiology | Admitting: Cardiology

## 2022-11-10 DIAGNOSIS — Z955 Presence of coronary angioplasty implant and graft: Secondary | ICD-10-CM

## 2022-11-10 DIAGNOSIS — I2111 ST elevation (STEMI) myocardial infarction involving right coronary artery: Secondary | ICD-10-CM

## 2022-11-10 NOTE — Progress Notes (Signed)
Daily Session Note  Patient Details  Name: Katherine Short MRN: [id number] Date of Birth: [birth year] Referring Provider:   Flowsheet Row CARDIAC REHAB PHASE II ORIENTATION from 09/09/2022 in [place]  Referring Provider Dr. Martinique       Encounter Date: 11/10/2022  Check In:  Session Check In - 11/10/22 0815       Check-In   Supervising physician immediately available to respond to emergencies [facility] immediately available    Physician(s) Dr. Harl Bowie    Location [facility]    Staff Present Leana Roe, BS, Exercise Physiologist;Dalton Sherrie George, MS, ACSM-CEP    Virtual Visit No    Medication changes reported     No    Fall or balance concerns reported    Yes    Comments She has fallen twice this year, with LOC with one fall.    Tobacco Cessation No Change    Warm-up and Cool-down Performed as group-led instruction    Resistance Training Performed Yes    VAD Patient? No    PAD/SET Patient? No      Pain Assessment   Currently in Pain? No/denies    Multiple Pain Sites No             Capillary Blood Glucose: No results found for this or any previous visit (from the past 24 hour(s)).    Social History   Tobacco Use  Smoking Status Never  Smokeless Tobacco Never    Goals Met:  Independence with exercise equipment Exercise tolerated well No report of concerns or symptoms today Strength training completed today  Goals Unmet:  Not Applicable  Comments: check out 0915   Dr. Carlyle Dolly is Medical Director for [facility]

## 2022-11-12 ENCOUNTER — Encounter (HOSPITAL_COMMUNITY)
Admission: RE | Admit: 2022-11-12 | Discharge: 2022-11-12 | Disposition: A | Discharge status: Home / Self Care | Payer: Medicare HMO | Source: Ambulatory Visit | Attending: Cardiology | Admitting: Cardiology

## 2022-11-12 ENCOUNTER — Ambulatory Visit (HOSPITAL_COMMUNITY): Payer: TRICARE For Life (TFL)

## 2022-11-12 DIAGNOSIS — I2111 ST elevation (STEMI) myocardial infarction involving right coronary artery: Secondary | ICD-10-CM | POA: Diagnosis not present

## 2022-11-12 DIAGNOSIS — Z955 Presence of coronary angioplasty implant and graft: Secondary | ICD-10-CM

## 2022-11-12 NOTE — Progress Notes (Addendum)
Daily Session Note  Patient Details  Name: Katherine Short MRN: [id number] Date of Birth: 11-Oct-1951 Referring Provider:   Flowsheet Row CARDIAC REHAB PHASE II ORIENTATION from 09/09/2022 in [place]  Referring Provider Dr. Martinique       Encounter Date: 11/12/2022  Check In:  Session Check In - 11/12/22 0809       Check-In   Supervising physician immediately available to respond to emergencies [facility] immediately available    Physician(s) Dr. Harl Bowie    Location [facility]    Staff Present Aundra Dubin, RN, Joanette Gula, RN, BSN;Heather Mel Almond, BS, Exercise Physiologist    Virtual Visit No    Medication changes reported     Yes    Comments PCP increased Trazodone from 50 mg to 100 mg for sleep.    Fall or balance concerns reported    Yes    Comments She has fallen twice this year, with LOC with one fall.    Tobacco Cessation No Change    Warm-up and Cool-down Performed as group-led instruction    Resistance Training Performed Yes    VAD Patient? No    PAD/SET Patient? No      Pain Assessment   Currently in Pain? No/denies    Multiple Pain Sites No             Capillary Blood Glucose: No results found for this or any previous visit (from the past 24 hour(s)).    Social History   Tobacco Use  Smoking Status Never  Smokeless Tobacco Never    Goals Met:  Independence with exercise equipment Exercise tolerated well No report of concerns or symptoms today Strength training completed today  Goals Unmet:  Not Applicable  Comments: Check out 915.   Dr. Carlyle Dolly is Medical Director for [facility]

## 2022-11-13 ENCOUNTER — Telehealth: Payer: Self-pay | Admitting: Student

## 2022-11-13 MED ORDER — NITROGLYCERIN 0.4 MG SL SUBL
0.4000 mg | SUBLINGUAL_TABLET | SUBLINGUAL | 0 refills | Status: DC | PRN
Start: 2022-11-13 — End: 2023-09-05

## 2022-11-13 NOTE — Telephone Encounter (Signed)
  Patient called Answering Service requesting refill of Nitroglycerin. Called and spoke with patient. She is out of town and forgot to bring her sublingual Nitroglycerin. She is not having any chest pain but just would like to have a prescription of this in case she needs. She remembered to bring all her other medications including Aspirin and Brilinta. Will send in a prescription of Nitroglycerin to requested pharmacy. Patient very thankful the help.  Darreld Mclean, PA-C 11/13/2022 12:19 PM

## 2022-11-15 ENCOUNTER — Ambulatory Visit (HOSPITAL_COMMUNITY): Payer: TRICARE For Life (TFL)

## 2022-11-15 ENCOUNTER — Encounter (HOSPITAL_COMMUNITY)
Admission: RE | Admit: 2022-11-15 | Discharge: 2022-11-15 | Disposition: A | Discharge status: Home / Self Care | Payer: Medicare HMO | Source: Ambulatory Visit | Attending: Cardiology | Admitting: Cardiology

## 2022-11-15 NOTE — Progress Notes (Deleted)
Daily Session Note  Patient Details  Name: ERYN MARANDOLA MRN: [id number] Date of Birth: 22-Oct-1951 Referring Provider:   Flowsheet Row CARDIAC REHAB PHASE II ORIENTATION from 09/09/2022 in [place]  Referring Provider Dr. Martinique       Encounter Date: 11/15/2022  Check In:  Session Check In - 11/15/22 0815       Check-In   Supervising physician immediately available to respond to emergencies [facility] immediately available    Physician(s) Dr. Domenic Polite    Location [facility]    Staff Present Leana Roe, BS, Exercise Physiologist;Daphyne Hassell Done, RN, BSN    Virtual Visit No    Medication changes reported     No    Fall or balance concerns reported    Yes    Comments She has fallen twice this year, with LOC with one fall.    Tobacco Cessation No Change    Warm-up and Cool-down Performed as group-led instruction    Resistance Training Performed Yes    VAD Patient? No    PAD/SET Patient? No      Pain Assessment   Currently in Pain? No/denies    Multiple Pain Sites No             Capillary Blood Glucose: No results found for this or any previous visit (from the past 24 hour(s)).    Social History   Tobacco Use  Smoking Status Never  Smokeless Tobacco Never    Goals Met:  Independence with exercise equipment Exercise tolerated well No report of concerns or symptoms today Strength training completed today  Goals Unmet:  Not Applicable  Comments: check out 0915   Dr. Carlyle Dolly is Medical Director for [facility]

## 2022-11-17 ENCOUNTER — Ambulatory Visit (HOSPITAL_COMMUNITY): Payer: TRICARE For Life (TFL)

## 2022-11-17 ENCOUNTER — Encounter (HOSPITAL_COMMUNITY): Payer: Medicare HMO

## 2022-11-19 ENCOUNTER — Ambulatory Visit (HOSPITAL_COMMUNITY): Payer: TRICARE For Life (TFL)

## 2022-11-19 ENCOUNTER — Encounter (HOSPITAL_COMMUNITY): Payer: Medicare HMO

## 2022-11-22 ENCOUNTER — Encounter (HOSPITAL_COMMUNITY)
Admission: RE | Admit: 2022-11-22 | Discharge: 2022-11-22 | Disposition: A | Discharge status: Home / Self Care | Payer: Medicare HMO | Source: Ambulatory Visit | Attending: Cardiology | Admitting: Cardiology

## 2022-11-22 ENCOUNTER — Ambulatory Visit (HOSPITAL_COMMUNITY): Payer: TRICARE For Life (TFL)

## 2022-11-22 DIAGNOSIS — Z955 Presence of coronary angioplasty implant and graft: Secondary | ICD-10-CM

## 2022-11-22 DIAGNOSIS — I2111 ST elevation (STEMI) myocardial infarction involving right coronary artery: Secondary | ICD-10-CM

## 2022-11-22 NOTE — Progress Notes (Signed)
Daily Session Note  Patient Details  Name: Katherine Short MRN: [id number] Date of Birth: [birth year] Referring Provider:   Flowsheet Row CARDIAC REHAB PHASE II ORIENTATION from 09/09/2022 in [place]  Referring Provider Dr. Martinique       Encounter Date: 11/22/2022  Check In:  Session Check In - 11/22/22 0815       Check-In   Supervising physician immediately available to respond to emergencies [facility] MD immediately available    Physician(s) Dr. Dellia Cloud    Location [facility]    Staff Present Leana Roe, BS, Exercise Physiologist;Dalton Sherrie George, MS, ACSM-CEP;Madelyn Flavors, RN, BSN    Virtual Visit No    Medication changes reported     No    Fall or balance concerns reported    Yes    Comments She has fallen twice this year, with LOC with one fall. She also fell during her vaction due to dog pulling her down.    Warm-up and Cool-down Performed as group-led Higher education careers adviser Performed Yes    VAD Patient? No    PAD/SET Patient? No      Pain Assessment   Currently in Pain? No/denies    Multiple Pain Sites No             Capillary Blood Glucose: No results found for this or any previous visit (from the past 24 hour(s)).    Social History   Tobacco Use  Smoking Status Never  Smokeless Tobacco Never    Goals Met:  Independence with exercise equipment Exercise tolerated well No report of concerns or symptoms today Strength training completed today  Goals Unmet:  Not Applicable  Comments: check out 0915   Dr. Carlyle Dolly is Medical Director for [facility]

## 2022-11-24 ENCOUNTER — Ambulatory Visit (HOSPITAL_COMMUNITY): Payer: TRICARE For Life (TFL)

## 2022-11-24 ENCOUNTER — Encounter (HOSPITAL_COMMUNITY)
Admission: RE | Admit: 2022-11-24 | Discharge: 2022-11-24 | Disposition: A | Discharge status: Home / Self Care | Payer: Medicare HMO | Source: Ambulatory Visit | Attending: Cardiology | Admitting: Cardiology

## 2022-11-24 DIAGNOSIS — Z955 Presence of coronary angioplasty implant and graft: Secondary | ICD-10-CM

## 2022-11-24 DIAGNOSIS — I2111 ST elevation (STEMI) myocardial infarction involving right coronary artery: Secondary | ICD-10-CM

## 2022-11-24 NOTE — Progress Notes (Signed)
Daily Session Note  Patient Details  Name: Katherine Short MRN: [id number] Date of Birth: 20-Nov-1951 Referring Provider:   Flowsheet Row CARDIAC REHAB PHASE II ORIENTATION from 09/09/2022 in [place]  Referring Provider Dr. Martinique       Encounter Date: 11/24/2022  Check In:  Session Check In - 11/24/22 0809       Check-In   Supervising physician immediately available to respond to emergencies [facility] MD immediately available    Physician(s) Dr. Dellia Cloud    Location [facility]    Staff Present Leana Roe, BS, Exercise Physiologist;Dalton Sherrie George, MS, ACSM-CEP;Melven Sartorius BSN, RN    Virtual Visit No    Medication changes reported     No    Fall or balance concerns reported    Yes    Comments She has fallen twice this year, with LOC with one fall. She also fell during her vaction due to dog pulling her down.    Tobacco Cessation No Change    Warm-up and Cool-down Performed on first and last piece of equipment    Resistance Training Performed Yes    VAD Patient? No    PAD/SET Patient? No      Pain Assessment   Currently in Pain? No/denies    Multiple Pain Sites No             Capillary Blood Glucose: No results found for this or any previous visit (from the past 24 hour(s)).    Social History   Tobacco Use  Smoking Status Never  Smokeless Tobacco Never    Goals Met:  Independence with exercise equipment Exercise tolerated well No report of concerns or symptoms today Strength training completed today  Goals Unmet:  Not Applicable  Comments: check out at 9:15   Dr. Carlyle Dolly is Medical Director for [facility]

## 2022-11-26 ENCOUNTER — Encounter (HOSPITAL_COMMUNITY)
Admission: RE | Admit: 2022-11-26 | Discharge: 2022-11-26 | Disposition: A | Discharge status: Home / Self Care | Payer: Medicare HMO | Source: Ambulatory Visit | Attending: Cardiology | Admitting: Cardiology

## 2022-11-26 ENCOUNTER — Ambulatory Visit (HOSPITAL_COMMUNITY): Payer: TRICARE For Life (TFL)

## 2022-11-26 DIAGNOSIS — Z955 Presence of coronary angioplasty implant and graft: Secondary | ICD-10-CM | POA: Insufficient documentation

## 2022-11-26 DIAGNOSIS — I2111 ST elevation (STEMI) myocardial infarction involving right coronary artery: Secondary | ICD-10-CM | POA: Insufficient documentation

## 2022-11-26 NOTE — Progress Notes (Signed)
Daily Session Note  Patient Details  Name: Katherine Short MRN: [id number] Date of Birth: 1951-06-01 Referring Provider:   Flowsheet Row CARDIAC REHAB PHASE II ORIENTATION from 09/09/2022 in [place]  Referring Provider Dr. Martinique       Encounter Date: 11/26/2022  Check In:  Session Check In - 11/26/22 0815       Check-In   Supervising physician immediately available to respond to emergencies [facility] MD immediately available    Physician(s) Dr. Dellia Cloud    Location [facility]    Staff Present Leana Roe, BS, Exercise Physiologist;Dalton Sherrie George, MS, ACSM-CEP;Madelyn Flavors, RN, BSN    Virtual Visit No    Medication changes reported     No    Fall or balance concerns reported    Yes    Comments She has fallen twice this year, with LOC with one fall. She also fell during her vaction due to dog pulling her down.    Tobacco Cessation No Change    Warm-up and Cool-down Performed as group-led instruction    Resistance Training Performed Yes    VAD Patient? No    PAD/SET Patient? No      Pain Assessment   Currently in Pain? No/denies    Multiple Pain Sites No             Capillary Blood Glucose: No results found for this or any previous visit (from the past 24 hour(s)).    Social History   Tobacco Use  Smoking Status Never  Smokeless Tobacco Never    Goals Met:  Independence with exercise equipment Exercise tolerated well No report of concerns or symptoms today Strength training completed today  Goals Unmet:  Not Applicable  Comments: check out 0915   Dr. Carlyle Dolly is Medical Director for [place]

## 2022-11-29 ENCOUNTER — Ambulatory Visit (HOSPITAL_COMMUNITY): Payer: TRICARE For Life (TFL)

## 2022-11-29 ENCOUNTER — Encounter (HOSPITAL_COMMUNITY)
Admission: RE | Admit: 2022-11-29 | Discharge: 2022-11-29 | Disposition: A | Discharge status: Home / Self Care | Payer: Medicare HMO | Source: Ambulatory Visit | Attending: Cardiology | Admitting: Cardiology

## 2022-11-29 DIAGNOSIS — I2111 ST elevation (STEMI) myocardial infarction involving right coronary artery: Secondary | ICD-10-CM

## 2022-11-29 DIAGNOSIS — Z955 Presence of coronary angioplasty implant and graft: Secondary | ICD-10-CM

## 2022-11-29 NOTE — Progress Notes (Signed)
Daily Session Note  Patient Details  Name: Katherine Short MRN: [id number] Date of Birth: 06-07-1951 Referring Provider:   Flowsheet Row CARDIAC REHAB PHASE II ORIENTATION from 09/09/2022 in [place]  Referring Provider Dr. Martinique       Encounter Date: 11/29/2022  Check In:  Session Check In - 11/29/22 0815       Check-In   Supervising physician immediately available to respond to emergencies [facility] MD immediately available    Physician(s) Dr. Harl Bowie    Location [facility]    Staff Present Leana Roe, BS, Exercise Physiologist;Dalton Sherrie George, MS, ACSM-CEP;Madelyn Flavors, RN, BSN    Virtual Visit No    Medication changes reported     No    Fall or balance concerns reported    Yes    Comments She has fallen twice this year, with LOC with one fall. She also fell during her vaction due to dog pulling her down.    Tobacco Cessation No Change    Warm-up and Cool-down Performed as group-led instruction    Resistance Training Performed Yes    VAD Patient? No    PAD/SET Patient? No      Pain Assessment   Currently in Pain? No/denies    Multiple Pain Sites No             Capillary Blood Glucose: No results found for this or any previous visit (from the past 24 hour(s)).    Social History   Tobacco Use  Smoking Status Never  Smokeless Tobacco Never    Goals Met:  Independence with exercise equipment Exercise tolerated well No report of concerns or symptoms today Strength training completed today  Goals Unmet:  Not Applicable  Comments: check out 0915   Dr. Carlyle Dolly is Medical Director for [place]

## 2022-12-01 ENCOUNTER — Encounter (HOSPITAL_COMMUNITY)
Admission: RE | Admit: 2022-12-01 | Discharge: 2022-12-01 | Disposition: A | Discharge status: Home / Self Care | Payer: Medicare HMO | Source: Ambulatory Visit | Attending: Cardiology | Admitting: Cardiology

## 2022-12-01 ENCOUNTER — Ambulatory Visit (HOSPITAL_COMMUNITY): Payer: TRICARE For Life (TFL)

## 2022-12-01 DIAGNOSIS — Z955 Presence of coronary angioplasty implant and graft: Secondary | ICD-10-CM

## 2022-12-01 DIAGNOSIS — I2111 ST elevation (STEMI) myocardial infarction involving right coronary artery: Secondary | ICD-10-CM | POA: Diagnosis not present

## 2022-12-01 NOTE — Progress Notes (Signed)
Daily Session Note  Patient Details  Name: Katherine Short MRN: [id number] Date of Birth: [birth year] Referring Provider:   Flowsheet Row CARDIAC REHAB PHASE II ORIENTATION from 09/09/2022 in [facility]  Referring Provider Dr. Martinique       Encounter Date: 12/01/2022  Check In:  Session Check In - 12/01/22 0808       Check-In   Supervising physician immediately available to respond to emergencies [facility] MD immediately available    Physician(s) Dr. Dellia Cloud    Location [facility]    Staff Present Leana Roe, BS, Exercise Physiologist;Tonio Seider BSN, RN;Debra Wynetta Emery, RN, BSN    Virtual Visit No    Medication changes reported     No    Comments PCP increased Trazodone from 50 mg to 100 mg for sleep.    Fall or balance concerns reported    Yes    Comments She has fallen twice this year, with LOC with one fall. She also fell during her vaction due to dog pulling her down.    Tobacco Cessation No Change    Warm-up and Cool-down Performed as group-led instruction    Resistance Training Performed Yes    VAD Patient? No    PAD/SET Patient? No      Pain Assessment   Currently in Pain? No/denies    Multiple Pain Sites No             Capillary Blood Glucose: No results found for this or any previous visit (from the past 24 hour(s)).    Social History   Tobacco Use  Smoking Status Never  Smokeless Tobacco Never    Goals Met:  Independence with exercise equipment Exercise tolerated well No report of concerns or symptoms today Strength training completed today  Goals Unmet:  Not Applicable  Comments: check out at 9:15   Dr. Carlyle Dolly is Medical Director for [place]

## 2022-12-03 ENCOUNTER — Ambulatory Visit (HOSPITAL_COMMUNITY): Payer: TRICARE For Life (TFL)

## 2022-12-03 ENCOUNTER — Encounter (HOSPITAL_COMMUNITY)
Admission: RE | Admit: 2022-12-03 | Discharge: 2022-12-03 | Disposition: A | Discharge status: Home / Self Care | Payer: Medicare HMO | Source: Ambulatory Visit | Attending: Cardiology | Admitting: Cardiology

## 2022-12-03 VITALS — Ht 64.0 in | Wt 177.0 lb

## 2022-12-03 DIAGNOSIS — I2111 ST elevation (STEMI) myocardial infarction involving right coronary artery: Secondary | ICD-10-CM | POA: Diagnosis not present

## 2022-12-03 DIAGNOSIS — Z955 Presence of coronary angioplasty implant and graft: Secondary | ICD-10-CM

## 2022-12-03 NOTE — Progress Notes (Signed)
Daily Session Note  Patient Details  Name: Katherine Short MRN: [id number] Date of Birth: [birth year] Referring Provider:   Flowsheet Row CARDIAC REHAB PHASE II ORIENTATION from 09/09/2022 in [place]  Referring Provider Dr. Martinique       Encounter Date: 12/03/2022  Check In:  Session Check In - 12/03/22 0814       Check-In   Supervising physician immediately available to respond to emergencies [facility] immediately available    Physician(s) Dr. Harl Bowie    Location [facility]    Staff Present Leana Roe, BS, Exercise Physiologist;Dalton Sherrie George, MS, ACSM-CEP;Geanie Cooley, RN;Debra Johnson, RN, BSN    Virtual Visit No    Medication changes reported     No    Fall or balance concerns reported    Yes    Comments She has fallen twice this year, with LOC with one fall. She also fell during her vaction due to dog pulling her down.    Tobacco Cessation No Change    Warm-up and Cool-down Performed as group-led instruction    Resistance Training Performed Yes    VAD Patient? No    PAD/SET Patient? No      Pain Assessment   Currently in Pain? No/denies    Multiple Pain Sites No             Capillary Blood Glucose: No results found for this or any previous visit (from the past 24 hour(s)).    Social History   Tobacco Use  Smoking Status Never  Smokeless Tobacco Never    Goals Met:  Independence with exercise equipment Exercise tolerated well No report of concerns or symptoms today Strength training completed today  Goals Unmet:  Not Applicable  Comments: check out @ 9:15am   Dr. Carlyle Dolly is Medical Director for [place]

## 2022-12-03 NOTE — Progress Notes (Signed)
Discharge Progress Report  Patient Details  Name: Katherine Short MRN: [id number] Date of Birth: Jun 23, 1951 Referring Provider:   Flowsheet Row CARDIAC REHAB PHASE II ORIENTATION from 09/09/2022 in [place]  Referring Provider Dr. Martinique        Number of Visits: 31  Reason for Discharge:  Patient reached a stable level of exercise. Patient independent in their exercise. Patient has met program and personal goals.  Smoking History:  Social History   Tobacco Use  Smoking Status Never  Smokeless Tobacco Never    Diagnosis:  Status post coronary artery stent placement  ST elevation myocardial infarction involving right coronary artery ([place])  ADL [facility]:   Initial Exercise Prescription:  Initial Exercise Prescription - 09/09/22 1000       Date of Initial Exercise RX and Referring Provider   Date 09/09/22    Referring Provider Dr. Martinique    Expected Discharge Date 12/03/22      Treadmill   MPH 1.7    Grade 0    Minutes 17      NuStep   Level 1    SPM 60    Minutes 22      Prescription Details   Frequency (times per week) 3    Duration Progress to 30 minutes of continuous aerobic without signs/symptoms of physical distress      Intensity   THRR 40-80% of Max Heartrate 60-119    Ratings of Perceived Exertion 11-13      Resistance Training   Training Prescription Yes    Weight 3    Reps 10-15             Discharge Exercise Prescription (Final Exercise Prescription Changes):  Exercise Prescription Changes - 11/12/22 0900       Response to Exercise   Blood Pressure (Admit) 128/80    Blood Pressure (Exercise) 108/60    Blood Pressure (Exit) 100/62    Heart Rate (Admit) 71 bpm    Heart Rate (Exercise) 108 bpm    Heart Rate (Exit) 80 bpm    Rating of Perceived Exertion (Exercise) 12    Duration Continue with 30 min of aerobic exercise without signs/symptoms of physical distress.    Intensity THRR unchanged       Progression   Progression Continue to progress workloads to maintain intensity without signs/symptoms of physical distress.      Resistance Training   Training Prescription Yes    Weight 4    Reps 10-15    Time 10 Minutes      Treadmill   MPH 2.5    Grade 1    Minutes 17    METs 3.26      NuStep   Level 3    SPM 116    Minutes 22    METs 3.26             Functional Capacity:  6 Minute Walk     Row Name 09/09/22 0959 12/03/22 0911       6 Minute Walk   Phase Initial Discharge    Distance 1400 feet 1650 feet    Walk Time 6 minutes 6 minutes    # of Rest Breaks 0 0    MPH 2.65 3.12    METS 2.71 3.19    RPE 11 12    VO2 Peak 9.49 11.16    Symptoms No No    Resting HR 79 bpm 75 bpm    Resting BP 116/80 124/60  Resting Oxygen Saturation  95 % 96 %    Exercise Oxygen Saturation  during 6 min walk 98 % 96 %    Max Ex. HR 95 bpm 95 bpm    Max Ex. BP 130/70 130/60    2 Minute Post BP 114/70 116/60             Psychological, QOL, Others - Outcomes: PHQ 2/9:    12/03/2022    1:48 PM 09/09/2022    8:51 AM 06/17/2021    3:40 PM  Depression screen PHQ 2/9  Decreased Interest _0 Down, Depressed, Hopeless _1 PHQ - 2 Score _2 Altered sleeping _3 Tired, decreased energy _4 Change in appetite _5 Feeling bad or failure about yourself  2 3 0  Trouble concentrating 1 3 0  Moving slowly or fidgety/restless 0 2 0  Suicidal thoughts 0 0 0  PHQ-9 Score _6 Difficult doing work/chores Somewhat difficult Somewhat difficult Somewhat difficult    Quality of Life:  Quality of Life - 12/03/22 1003       Quality of Life   Select Quality of Life      Quality of Life Scores   Health/Function Pre 19.53 %    Health/Function Post 19.37 %    Health/Function % Change -0.82 %    Socioeconomic Pre 29.63 %    Socioeconomic Post 17.57 %    Socioeconomic % Change  -40.7 %    Psych/Spiritual Pre 17.93 %    Psych/Spiritual Post 14.71 %     Psych/Spiritual % Change -17.96 %    Family Pre 21 %    Family Post 21.6 %    Family % Change 2.86 %    GLOBAL Pre 20.81 %    GLOBAL Post 18.37 %    GLOBAL % Change -11.73 %             Personal Goals: Goals established at orientation with interventions provided to work toward goal.  Personal Goals and Risk Factors at Admission - 09/09/22 0900       Core Components/Risk Factors/Patient Goals on Admission   Hypertension Yes    Intervention Provide education on lifestyle modifcations including regular physical activity/exercise, weight management, moderate sodium restriction and increased consumption of fresh fruit, vegetables, and low fat dairy, alcohol moderation, and smoking cessation.;Monitor prescription use compliance.    Expected Outcomes Short Term: Continued assessment and intervention until BP is < 140/50m HG in hypertensive participants. < 130/872mHG in hypertensive participants with diabetes, heart failure or chronic kidney disease.;Long Term: Maintenance of blood pressure at goal levels.    Lipids Yes    Intervention Provide education and support for participant on nutrition & aerobic/resistive exercise along with prescribed medications to achieve LDL <7049mHDL >67m46m  Expected Outcomes Short Term: Participant states understanding of desired cholesterol values and is compliant with medications prescribed. Participant is following exercise prescription and nutrition guidelines.;Long Term: Cholesterol controlled with medications as prescribed, with individualized exercise RX and with personalized nutrition plan. Value goals: LDL < 70mg69mL > 40 mg.    Personal Goal Other Yes    Personal Goal Improve energy levels and improve sleeping.    Intervention Attend CR three days per week and continue to exercise at home 2-4 days per week.    Expected Outcomes Pt will meet stated goals.  Personal Goals Discharge:  Goals and Risk Factor Review     Row Name  09/27/22 0910 12/03/22 1353           Core Components/Risk Factors/Patient Goals Review   Personal Goals Review Hypertension;Lipids;Other Hypertension;Lipids;Other      Review Patient was referred to CR with STEMI and stent placement. She has multiple risk factors for CAD and is participating in the program for risk modification. She is new to the program completing 7 sessions. Her current weight is 178.9 down 2.1 lbs from her initial weight. She is doing well in the program. Her blood pressure is well controlled. Her personal goals for the program are to improve her energy levels and her sleep. We will continue to monitor her progress as she works towards [employer] these goals. Pt graduated from CR after 31 sessions. He weight was stable while in the program. Her vitals were WNLs while she was in the program. She was able to improve her energy levels and reports that she got a lot out of the program.      Expected Outcomes Patient will complete the program meeting both persona and program goals. Pt will continue to work towards their goals post discharge.               Exercise Goals and Review:  Exercise Goals     Row Name 09/09/22 1001 10/04/22 1043 11/01/22 1030         Exercise Goals   Increase Physical Activity Yes Yes Yes     Intervention Provide advice, education, support and counseling about physical activity/exercise needs.;Develop an individualized exercise prescription for aerobic and resistive training based on initial evaluation findings, risk stratification, comorbidities and participant's personal goals. Provide advice, education, support and counseling about physical activity/exercise needs.;Develop an individualized exercise prescription for aerobic and resistive training based on initial evaluation findings, risk stratification, comorbidities and participant's personal goals. Provide advice, education, support and counseling about physical activity/exercise needs.;Develop an  individualized exercise prescription for aerobic and resistive training based on initial evaluation findings, risk stratification, comorbidities and participant's personal goals.     Expected Outcomes Short Term: Attend rehab on a regular basis to increase amount of physical activity.;Long Term: Add in home exercise to make exercise part of routine and to increase amount of physical activity.;Long Term: Exercising regularly at least 3-5 days a week. Short Term: Attend rehab on a regular basis to increase amount of physical activity.;Long Term: Add in home exercise to make exercise part of routine and to increase amount of physical activity.;Long Term: Exercising regularly at least 3-5 days a week. Short Term: Attend rehab on a regular basis to increase amount of physical activity.;Long Term: Add in home exercise to make exercise part of routine and to increase amount of physical activity.;Long Term: Exercising regularly at least 3-5 days a week.     Increase Strength and Stamina Yes Yes Yes     Intervention Provide advice, education, support and counseling about physical activity/exercise needs.;Develop an individualized exercise prescription for aerobic and resistive training based on initial evaluation findings, risk stratification, comorbidities and participant's personal goals. Provide advice, education, support and counseling about physical activity/exercise needs.;Develop an individualized exercise prescription for aerobic and resistive training based on initial evaluation findings, risk stratification, comorbidities and participant's personal goals. Provide advice, education, support and counseling about physical activity/exercise needs.;Develop an individualized exercise prescription for aerobic and resistive training based on initial evaluation findings, risk stratification, comorbidities and participant's personal goals.  Expected Outcomes Short Term: Increase workloads from initial exercise  prescription for resistance, speed, and METs.;Short Term: Perform resistance training exercises routinely during rehab and add in resistance training at home;Long Term: Improve cardiorespiratory fitness, muscular endurance and strength as measured by increased METs and functional capacity (6MWT) Short Term: Increase workloads from initial exercise prescription for resistance, speed, and METs.;Short Term: Perform resistance training exercises routinely during rehab and add in resistance training at home;Long Term: Improve cardiorespiratory fitness, muscular endurance and strength as measured by increased METs and functional capacity (6MWT) Short Term: Increase workloads from initial exercise prescription for resistance, speed, and METs.;Short Term: Perform resistance training exercises routinely during rehab and add in resistance training at home;Long Term: Improve cardiorespiratory fitness, muscular endurance and strength as measured by increased METs and functional capacity (6MWT)     Able to understand and use rate of perceived exertion (RPE) scale Yes Yes Yes     Intervention Provide education and explanation on how to use RPE scale Provide education and explanation on how to use RPE scale Provide education and explanation on how to use RPE scale     Expected Outcomes Short Term: Able to use RPE daily in rehab to express subjective intensity level;Long Term:  Able to use RPE to guide intensity level when exercising independently Short Term: Able to use RPE daily in rehab to express subjective intensity level;Long Term:  Able to use RPE to guide intensity level when exercising independently Short Term: Able to use RPE daily in rehab to express subjective intensity level;Long Term:  Able to use RPE to guide intensity level when exercising independently     Knowledge and understanding of Target Heart Rate Range (THRR) Yes Yes Yes     Intervention Provide education and explanation of THRR including how the  numbers were predicted and where they are located for reference Provide education and explanation of THRR including how the numbers were predicted and where they are located for reference Provide education and explanation of THRR including how the numbers were predicted and where they are located for reference     Expected Outcomes Short Term: Able to state/look up THRR;Short Term: Able to use daily as guideline for intensity in rehab;Long Term: Able to use THRR to govern intensity when exercising independently Short Term: Able to state/look up THRR;Short Term: Able to use daily as guideline for intensity in rehab;Long Term: Able to use THRR to govern intensity when exercising independently Short Term: Able to state/look up THRR;Short Term: Able to use daily as guideline for intensity in rehab;Long Term: Able to use THRR to govern intensity when exercising independently     Able to check pulse independently Yes Yes Yes     Intervention Provide education and demonstration on how to check pulse in carotid and radial arteries.;Review the importance of being able to check your own pulse for safety during independent exercise Provide education and demonstration on how to check pulse in carotid and radial arteries.;Review the importance of being able to check your own pulse for safety during independent exercise Provide education and demonstration on how to check pulse in carotid and radial arteries.;Review the importance of being able to check your own pulse for safety during independent exercise     Expected Outcomes Short Term: Able to explain why pulse checking is important during independent exercise;Long Term: Able to check pulse independently and accurately Short Term: Able to explain why pulse checking is important during independent exercise;Long Term: Able to check pulse independently  and accurately Short Term: Able to explain why pulse checking is important during independent exercise;Long Term: Able to  check pulse independently and accurately     Understanding of Exercise Prescription Yes Yes Yes     Intervention Provide education, explanation, and written materials on patient's individual exercise prescription Provide education, explanation, and written materials on patient's individual exercise prescription Provide education, explanation, and written materials on patient's individual exercise prescription     Expected Outcomes Short Term: Able to explain program exercise prescription;Long Term: Able to explain home exercise prescription to exercise independently Short Term: Able to explain program exercise prescription;Long Term: Able to explain home exercise prescription to exercise independently Short Term: Able to explain program exercise prescription;Long Term: Able to explain home exercise prescription to exercise independently              Exercise Goals Re-Evaluation:  Exercise Goals Re-Evaluation     Row Name 10/04/22 1044 11/01/22 1031           Exercise Goal Re-Evaluation   Exercise Goals Review Increase Physical Activity;Increase Strength and Stamina;Able to understand and use rate of perceived exertion (RPE) scale;Knowledge and understanding of Target Heart Rate Range (THRR);Able to check pulse independently;Understanding of Exercise Prescription Increase Physical Activity;Increase Strength and Stamina;Able to understand and use rate of perceived exertion (RPE) scale;Knowledge and understanding of Target Heart Rate Range (THRR);Able to check pulse independently;Understanding of Exercise Prescription      Comments Pt has completed 10 sessions of cardiac rehab. She has been able to progress her workloads and is progressing well in the program. She seems to enjoy the classes and is exercising at home on her off days. She is currently exercising at 2.83 METs on the stepper. Will continue to monitor and progress as able. Pt has completed 21 session of cardiac rehab. She continues to  increase her workloads on the treadmill and stepper. She is going to water aerobics twice a week outside of rehab. She is curretnly exercising at 3.44 METs on  the stepper. Will continue to monitor and progress as able.      Expected Outcomes Through exercise at rehab and home, the patient will meet their stated goals. Through exercise at rehab and home, the patient will meet their stated goals.               Nutrition & Weight - Outcomes:  Pre Biometrics - 09/09/22 1002       Pre Biometrics   Height _0  (1.626 m)    Weight 181 lb 10.5 oz (82.4 kg)    Waist Circumference 38 inches    Hip Circumference 43.5 inches    Waist to Hip Ratio 0.87 %    BMI (Calculated) 31.17    Triceps Skinfold 24 mm    % Body Fat 41.3 %    Grip Strength 23.5 kg    Flexibility 8 in    Single Leg Stand 11.8 seconds             Post Biometrics - 12/03/22 0912        Post  Biometrics   Height _1  (1.626 m)    Weight 177 lb 0.5 oz (80.3 kg)    Waist Circumference 37.5 inches    Hip Circumference 43 inches    Waist to Hip Ratio 0.87 %    BMI (Calculated) 30.37    Triceps Skinfold 25 mm    % Body Fat 40.9 %    Grip Strength 26.1 kg  Flexibility 10 in    Single Leg Stand 10.52 seconds             Nutrition:  Nutrition Therapy & Goals - 09/27/22 0914       Personal Nutrition Goals   Comments Patient scored 42 on her diet assessment. We offer 2 educational sessions on heart healthy nutrition with handouts and assistance with RD referral if patient is interested.      Intervention Plan   Intervention Nutrition handout(s) given to patient.    Expected Outcomes Long Term Goal: Adherence to prescribed nutrition plan.             Nutrition Discharge:  Nutrition Assessments - 12/03/22 1348       MEDFICTS Scores   Pre Score 42    Post Score 48    Score Difference 6             Education Questionnaire Score:  Knowledge Questionnaire Score - 12/03/22 1347        Knowledge Questionnaire Score   Pre Score 19/24    Post Score 17/24             Goals reviewed with patient; copy given to patient. Pt graduated from CR after 31 sessions. She was able to improve her walk test distance by 17.9%, and her MET level was 3.59 at discharge. She already does aqua aerobics at the [facility] a few times per week and walks several days per week. 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

## 2022-12-21 ENCOUNTER — Other Ambulatory Visit (HOSPITAL_COMMUNITY): Payer: Self-pay | Admitting: Family Medicine

## 2022-12-21 ENCOUNTER — Ambulatory Visit (HOSPITAL_COMMUNITY)
Admission: RE | Admit: 2022-12-21 | Discharge: 2022-12-21 | Disposition: A | Discharge status: Home / Self Care | Payer: Medicare HMO | Source: Ambulatory Visit | Attending: Family Medicine | Admitting: Family Medicine

## 2022-12-21 DIAGNOSIS — M25512 Pain in left shoulder: Secondary | ICD-10-CM

## 2022-12-21 DIAGNOSIS — R0789 Other chest pain: Secondary | ICD-10-CM

## 2022-12-28 ENCOUNTER — Telehealth: Payer: Self-pay

## 2022-12-28 ENCOUNTER — Ambulatory Visit (INDEPENDENT_AMBULATORY_CARE_PROVIDER_SITE_OTHER): Payer: Medicare HMO | Admitting: Neurology

## 2022-12-28 DIAGNOSIS — Z8673 Personal history of transient ischemic attack (TIA), and cerebral infarction without residual deficits: Secondary | ICD-10-CM

## 2022-12-28 DIAGNOSIS — R351 Nocturia: Secondary | ICD-10-CM

## 2022-12-28 DIAGNOSIS — R0683 Snoring: Secondary | ICD-10-CM

## 2022-12-28 DIAGNOSIS — R5383 Other fatigue: Secondary | ICD-10-CM

## 2022-12-28 DIAGNOSIS — R9431 Abnormal electrocardiogram [ECG] [EKG]: Secondary | ICD-10-CM

## 2022-12-28 DIAGNOSIS — G4720 Circadian rhythm sleep disorder, unspecified type: Secondary | ICD-10-CM

## 2022-12-28 DIAGNOSIS — I213 ST elevation (STEMI) myocardial infarction of unspecified site: Secondary | ICD-10-CM

## 2022-12-28 DIAGNOSIS — E669 Obesity, unspecified: Secondary | ICD-10-CM
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
## 2023-01-03 NOTE — Procedures (Signed)
Physician Interpretation:     [facility] POLYSOMNOGRAPHY  INTERPRETATION REPORT   STUDY DATE:  12/28/2022     PATIENT NAME:  Katherine Short         DATE OF BIRTH:  1951/11/12  PATIENT ID:  [id number]    TYPE OF STUDY:  PSG  READING PHYSICIAN: [name], MD   SCORING TECHNICIAN: Richard Miu, RPSGT  Referred by: Marcial Pacas, MD, PhD  History: 72 year old female with an underlying medical history of hypertension, hyperlipidemia, coronary artery disease with history of STEMI, stroke in August 2023,?headaches, reflux disease, anxiety, and borderline obesity, who reports snoring and?nonrestorative sleep, feeling tired during the day, difficulty initiating and maintaining sleep for the past few months, particularly after her stroke and heart attack in August 2023. Her Epworth sleepiness score is 6 out of 24, fatigue severity score is 43 out of 63. Height: 64 in Weight: 179 lb (BMI 30) Neck Size: 15 in   MEDICATIONS: Tylenol, Zovirax, Aspirin, Neurontin, Antivert, Lopressor, Nitrostat, Ozempic, Protonix, Crestor, Brilinta  TECHNICAL DESCRIPTION: A registered sleep technologist was in attendance for the duration of the recording.  Data collection, scoring, video monitoring, and reporting were performed in compliance with the AASM Manual for the Scoring of Sleep and Associated Events; (Hypopnea is scored based on the criteria listed in Section VIII D. 1b in the AASM Manual V2.6 using a 4% oxygen desaturation rule or Hypopnea is scored based on the criteria listed in Section VIII D. 1a in the AASM Manual V2.6 using 3% oxygen desaturation and /or arousal rule).   SLEEP CONTINUITY AND SLEEP ARCHITECTURE:  Lights-out was at 20:18: and lights-on at  05:03:, with a total recording time of 8 hours, 44.5 min. Total sleep time ( TST) was 404.5 minutes with a decreased sleep efficiency at 77.1%.    BODY POSITION:  TST was divided  between the following sleep positions: 0.0%  supine;  100.0% lateral;  0% prone. Duration of total sleep and percent of total sleep in their respective position is as follows: supine 00 minutes (0%), non-supine 405 minutes (100%); right 262 minutes (65%), left 142 minutes (35%), and prone 00 minutes (0%).  Total supine REM sleep time was 00 minutes (0% of total REM sleep).  Sleep latency was mildly increased at 32.5 minutes.  REM sleep latency was increased at 145.5 minutes. Of the total sleep time, the percentage of stage N1 sleep was 5.6%, stage N2 sleep was 66%, which is increased, stage N3 sleep was 8.5%, and REM sleep was 19.7%, which is normal. Wake after sleep onset (WASO) time accounted for 87.5 minutes with minimal to mild sleep fragmentation noted and 2 longer periods of wakefulness.   RESPIRATORY MONITORING:  Based on CMS criteria (using a 4% oxygen desaturation rule for scoring hypopneas), there were 0 apneas (0 obstructive; 0 central; 0 mixed), and 5 hypopneas.  Apnea index was 0.0. Hypopnea index was 0.7. The apnea-hypopnea index was 0.7/hour overall (0.0 supine, 2 non-supine; 2.3 REM, 0.0 supine REM).  There were 0 respiratory effort-related arousals (RERAs).  The RERA index was 0 events/h. Total respiratory disturbance index (RDI) was 0.7 events/h. RDI results showed: supine RDI  0.0 /h; non-supine RDI 0.7 /h; REM RDI 2.3 /h, supine REM RDI 0.0 /h.   Based on AASM criteria (using a 3% oxygen desaturation and /or arousal rule for scoring hypopneas), there were 0 apneas (0 obstructive; 0 central; 0 mixed), and 5 hypopneas. Apnea index was 0.0. Hypopnea index was 0.7.  The apnea-hypopnea index was 0.7 overall (0.0 supine, 2 non-supine; 2.3 REM, 0.0 supine REM).  There were 0 respiratory effort-related arousals (RERAs).  The RERA index was 0 events/h. Total respiratory disturbance index (RDI) was 0.7 events/h. RDI results showed: supine RDI  0.0 /h; non-supine RDI 0.7 /h; REM RDI 2.3 /h, supine REM RDI 0.0 /h.   OXIMETRY: Oxyhemoglobin  Saturation Nadir during sleep was at  87%) from a mean of 94%.  Of the Total sleep time (TST)   hypoxemia (=<88%) was present for  0.1 minutes, or 0.0% of total sleep time.   LIMB MOVEMENTS: There were 45 periodic limb movements of sleep (6.7/hr), of which 0 (0.0/hr) were associated with an arousal.  AROUSAL: There were 32 arousals in total, for an arousal index of 5 arousals/hour.  Of these, 4 were identified as respiratory-related arousals (1 /h), 0 were PLM-related arousals (0 /h), and 34 were non-specific arousals (5 /h).  EEG: Review of the EEG showed no abnormal electrical discharges and symmetrical bihemispheric findings.    EKG: The EKG revealed normal sinus rhythm (NSR), with rare PVCs noted. The average heart rate during sleep was 74 bpm.   AUDIO/VIDEO REVIEW: The audio and video review did not show any abnormal or unusual behaviors, movements, phonations or vocalizations. The patient took 1 bathroom break.  The patient took gabapentin and pain medication prior to test started.  She did not achieve any supine sleep.  Mild to moderate snoring was detected.    POST-STUDY QUESTIONNAIRE: Post study, the patient indicated, that sleep was the same as usual.   IMPRESSION:  1. Primary Snoring 2. Dysfunctions associated with sleep stages or arousal from sleep 3. Non-specific abnormal electrocardiogram (EKG)  RECOMMENDATIONS:  1. This study does not demonstrate any significant obstructive or central sleep disordered breathing with an AHI of less than 5/hour - Her total AHI was 0.74/hour, O2 nadir 87% (briefly). The absence of supine sleep during the study may have underestimated her sleep disordered breathing.  Snoring was in the mild to moderate range.  Weight loss and avoiding the supine sleep position at home may improve snoring. Treatment with a positive airway pressure device, such as CPAP or autoPAP is not indicated.  2. This study shows sleep fragmentation and mildly abnormal sleep stage  percentages; these are nonspecific findings and per se do not signify an intrinsic sleep disorder or a cause for the patient's sleep-related symptoms. Causes include (but are not limited to) the first night effect of the sleep study, circadian rhythm disturbances, medication effect or an underlying mood disorder or medical problem.  3. The patient should be cautioned not to drive, work at heights, or operate dangerous or heavy equipment when tired or sleepy. Review and reiteration of good sleep hygiene measures should be pursued with any patient. 4. The study showed rare PVCs on single lead EKG; clinical correlation is recommended. The patient is well-known to cardiology.  5. The patient will be advised to follow up with the referring provider, who will be notified of the test results.  I certify that I have reviewed the entire raw data recording prior to the issuance of this report in accordance with the Standards of Accreditation of the American Academy of Sleep Medicine (AASM).  [name], MD, PhD Medical Director, [facility]) [place], ABPN (Neurology and Sleep)              Technical Report:   General Information  Name: Katherine Short, Katherine Short  BMI: 30.73 Physician: [name], MD  ID: [id number] Height: 64.0 in Technician: Richard Miu, RPSGT  Sex: Female Weight: 179.0 lb Record: [id number]  Age: 5 [April 09, 1951] Date: 12/28/2022    Medical & Medication History    72 year old female with an underlying medical history of hypertension, hyperlipidemia, coronary artery disease with history of STEMI, stroke in August 2023, headaches, reflux disease, anxiety, and borderline obesity, who reports snoring and nonrestorative sleep, feeling tired during the day, difficulty initiating and maintaining sleep for the past few months, particularly after her stroke and heart attack in August 2023. She has not tried any medication for this. Tylenol, Zovirax,  Aspirin, Neurontin, Antivert, Lopressor, Nitrostat, Ozempic, Protonix, Crestor, Brilinta   Sleep Disorder      Comments   The patient came into the sleep lab for a PSG. The patient took gabapentin and Vicodin prior to the start of the study. The patient did not use mouth guard during the study. Per pt she does not use mouth guard. EKG kept in NSR with occasional PVC's. Mild to moderate snoring. PLM's witnessed. One restroom trip. All respiratory events scored with a 4% desat. The patient slept lateral only. All sleep stages observed.     Lights out: 08:18:44 PM Lights on: 05:03:28 AM   Time Total Supine Side Prone Upright  Recording (TRT) 8h 44.37m0h 1.564mh 43.57m56m 0.57m 81m0.57m  37mep (TST) 6h 44.68m 0h64m57m 6h 757m68m 0h 0457m 0h 0.1157m  Late57m N1 N2 N3 REM Onset Per. Slp. Eff.  Actual 0h 0.57m 0h 6.68m83m 11.57m1m 25.68m 26m32.68m 018m7.68m 77757m%   [id number] Wake N1 N2 N3 REM  Total 120.0 22.5 268.0 34.5 79.5  Supine 1.5 0.0 0.0 0.0 0.0  Side 118.5 22.5 268.0 34.5 79.5  Prone 0.0 0.0 0.0 0.0 0.0  Upright 0.0 0.0 0.0 0.0 0.0   Stg % Wake N1 N2 N3 REM  Total 22.9 5.6 66.3 8.5 19.7  Supine 0.3 0.0 0.0 0.0 0.0  Side 22.6 5.6 66.3 8.5 19.7  Prone 0.0 0.0 0.0 0.0 0.0  Upright 0.0 0.0 0.0 0.0 0.0     Apnea Summary Sub Supine Side Prone Upright  Total 0 Total 0 0 0 0 0    REM 0 0 0 0 0    NREM 0 0 0 0 0  Obs 0 REM 0 0 0 0 0    NREM 0 0 0 0 0  Mix 0 REM 0 0 0 0 0    NREM 0 0 0 0 0  Cen 0 REM 0 0 0 0 0    NREM 0 0 0 0 0   Rera Summary Sub Supine Side Prone Upright  Total 0 Total 0 0 0 0 0    REM 0 0 0 0 0    NREM 0 0 0 0 0   Hypopnea Summary Sub Supine Side Prone Upright  Total 5 Total 5 0 5 0 0    REM 3 0 3 0 0    NREM 2 0 2 0 0   4% Hypopnea Summary Sub Supine Side Prone Upright  Total (4%) 5 Total 5 0 5 0 0    REM 3 0 3 0 0    NREM 2 0 2 0 0     AHI Total Obs Mix Cen  0.74 Apnea 0.00 0.00 0.00 0.00   Hypopnea 0.74 -- -- --  0.74 Hypopnea (4%) 0.74 -- -- --  Total  Supine Side Prone Upright  Position AHI 0.74 0.00 0.74 0.00 0.00  REM AHI 2.26   NREM AHI 0.37   Position RDI 0.74 0.00 0.74 0.00 0.00  REM RDI 2.26   NREM RDI 0.37    4% Hypopnea Total Supine Side Prone Upright  Position AHI (4%) 0.74 0.00 0.74 0.00 0.00  REM AHI (4%) 2.26   NREM AHI (4%) 0.37   Position RDI (4%) 0.74 0.00 0.74 0.00 0.00  REM RDI (4%) 2.26   NREM RDI (4%) 0.37    Desaturation Information Threshold: 2% <100% <90% <80% <70% <60% <50% <40%  Supine 2.0 0.0 0.0 0.0 0.0 0.0 0.0  Side 116.0 3.0 0.0 0.0 0.0 0.0 0.0  Prone 0.0 0.0 0.0 0.0 0.0 0.0 0.0  Upright 0.0 0.0 0.0 0.0 0.0 0.0 0.0  Total 118.0 3.0 0.0 0.0 0.0 0.0 0.0  Index 14.4 0.4 0.0 0.0 0.0 0.0 0.0   Threshold: 3% <100% <90% <80% <70% <60% <50% <40%  Supine 2.0 0.0 0.0 0.0 0.0 0.0 0.0  Side 23.0 3.0 0.0 0.0 0.0 0.0 0.0  Prone 0.0 0.0 0.0 0.0 0.0 0.0 0.0  Upright 0.0 0.0 0.0 0.0 0.0 0.0 0.0  Total 25.0 3.0 0.0 0.0 0.0 0.0 0.0  Index 3.0 0.4 0.0 0.0 0.0 0.0 0.0   Threshold: 4% <100% <90% <80% <70% <60% <50% <40%  Supine 1.0 0.0 0.0 0.0 0.0 0.0 0.0  Side 12.0 3.0 0.0 0.0 0.0 0.0 0.0  Prone 0.0 0.0 0.0 0.0 0.0 0.0 0.0  Upright 0.0 0.0 0.0 0.0 0.0 0.0 0.0  Total 13.0 3.0 0.0 0.0 0.0 0.0 0.0  Index 1.6 0.4 0.0 0.0 0.0 0.0 0.0   Threshold: 3% <100% <90% <80% <70% <60% <50% <40%  Supine 2 0 0 0 0 0 0  Side 23 3 0 0 0 0 0  Prone 0 0 0 0 0 0 0  Upright 0 0 0 0 0 0 0  Total 25 3 0 0 0 0 0   Awakening/Arousal Information # of Awakenings 14  Wake after sleep onset 87.16m Wake after persistent sleep 83.571m Arousal Assoc. Arousals Index  Apneas 0 0.0  Hypopneas 4 0.6  Leg Movements 0 0.0  Snore 0 0.0  PTT Arousals 0 0.0  Spontaneous 34 5.0  Total 38 5.6  Leg Movement Information PLMS LMs Index  Total LMs during PLMS 45 6.7  LMs w/ Microarousals 0 0.0   LM LMs Index  w/ Microarousal 0 0.0  w/ Awakening 0 0.0  w/ Resp Event 0 0.0  Spontaneous 28 4.2  Total 28 4.2     Desaturation threshold  setting: 3% Minimum desaturation setting: 10 seconds SaO2 nadir: 84% The longest event was a 72 sec obstructive Hypopnea with a minimum SaO2 of 88%. The lowest SaO2 was 84% associated with a 27 sec obstructive Hypopnea. EKG Rates EKG Avg Max Min  Awake 77 108 59  Asleep 74 90 62  EKG Events: Tachycardia

## 2023-01-11 ENCOUNTER — Telehealth: Payer: Self-pay | Admitting: *Deleted

## 2023-01-11 NOTE — Telephone Encounter (Signed)
Spoke to pt and relayed the sleep study results to her. No cpap or other machine needed.  She has no OSA.  Mild to moderate snoring.  Keep off back and weight loss may assist with snoring.  She verbalized understanding.

## 2023-01-11 NOTE — Telephone Encounter (Signed)
-----  Message from [name], MD sent at 01/03/2023  6:35 PM EST ----- Patient referred by Dr. Krista Blue, seen by me on 10/04/22, diagnostic PSG on 12/28/22.   Please call and notify the patient that the recent sleep study did not show any significant obstructive sleep apnea. She had mild to moderate snoring. Some weight loss and avoiding the back sleep position at home may improve snoring. Treatment with a positive airway pressure device, such as CPAP or autoPAP is not indicated. At this juncture, she can follow up with Dr. Krista Blue and her other providers as planned/scheduled.  Thanks,  [name], MD, PhD [facility])

## 2023-02-03 NOTE — Progress Notes (Unsigned)
Cardiology Office Note:    Date:  02/03/2023   ID:  Katherine Short, DOB [birth year], MRN [id number]  PCP:  Valentino Nose, [facility] Cardiologist:  Cordell Coke Martinique, MD { Click to update primary MD,subspecialty MD or APP then REFRESH:1}    Referring MD: Valentino Nose, FNP   No chief complaint on file. ***  History of Present Illness:    Katherine Short is a 72 y.o. female with a history of CAD s/p STEMI, DES-RCA in 07/2022, CVA, hypertension, hyperlipidemia, GERD, and anxiety who presents for follow-up related to CAD.  Myoview in March 2011 was low risk.  Echocardiogram at the time was normal.  She presented to the ED on 08/08/2022 with acute onset chest pain. EKG showed inferior ST elevation with reciprocal changes in anterior leads.  She was transferred to [facility] for emergent cardiac catheterization which revealed acute occlusion of the PL branch of RCA s/p DES, otherwise diffuse moderate disease and small caliber LAD and diagonal vessels, EF 55-65%. Echocardiogram showed EF 60 to 65%,  normal LV function, no RWMA, G1 DD, reduced RV systolic function, mild LA enlargement, and mild mitral valve regurgitation.  Consideration of outpatient Myoview in 3 to 6 months was recommended to reassess residual LAD stenosis and evaluate for anterior wall ischemia.  She was started on aspirin, Brilinta, low-dose metoprolol, and Crestor (previously on simvastatin). Home Benicar was held at discharge due to borderline BP.  She presented to the ED again on 08/12/2022 with reports of left arm tingling, left hip and left leg tingling and diaphoresis. MRI of the brain showed 2 small cortical infarcts in the left parietal lobe, chronic microhemorrhage in the right corona radiata, and tiny chronic infarct in the left cerebellum. MRA, carotid ultrasound, and Zio patch were recommended.  She was discharged home in stable condition. Carotid dopplers done were OK. Event monitor ordered but never  completed. Seen by Neuro and sleep study recommended. This showed no significant sleep apnea.     Past Medical History:  Diagnosis Date   Anxiety    Chest pain 03/19/2010   nuclear study was negative   Depression    Dyslipidemia    GERD (gastroesophageal reflux disease)    Headache(784.0)    Hypertension    Obstructive sleep apnea 08/24/2022   STEMI (ST elevation myocardial infarction) ([place])    Stroke [facility])     Past Surgical History:  Procedure Laterality Date   BREAST LUMPECTOMY WITH RADIOACTIVE SEED LOCALIZATION Left 07/07/2017   Procedure: LEFT BREAST LUMPECTOMY WITH RADIOACTIVE SEED LOCALIZATION;  Surgeon: Jovita Kussmaul, MD;  Location: [place];  Service: General;  Laterality: Left;   BREAST LUMPECTOMY WITH RADIOACTIVE SEED LOCALIZATION Left 07/18/2017   Procedure: LEFT BREAST LUMPECTOMY WITH RADIOACTIVE SEED LOCALIZATION;  Surgeon: Jovita Kussmaul, MD;  Location: [place];  Service: General;  Laterality: Left;   CATARACT EXTRACTION W/PHACO  11/22/2011   Procedure: CATARACT EXTRACTION PHACO AND INTRAOCULAR LENS PLACEMENT ([place]);  Surgeon: Tonny Branch;  Location: [facility];  Service: Ophthalmology;  Laterality: Left;  CDE:7.76   CATARACT EXTRACTION W/PHACO  12/02/2011   Procedure: CATARACT EXTRACTION PHACO AND INTRAOCULAR LENS PLACEMENT ([facility]);  Surgeon: Tonny Branch;  Location: [facility];  Service: Ophthalmology;  Laterality: Right;  CDE=11.92   COLONOSCOPY     CORONARY/GRAFT ACUTE MI REVASCULARIZATION N/A 08/08/2022   Procedure: Coronary/Graft Acute MI Revascularization;  Surgeon: Martinique, Zaire Levesque M, MD;  Location: [place] [facility];  Service: Cardiovascular;  Laterality: N/A;   LEFT HEART CATH AND CORONARY ANGIOGRAPHY N/A 08/08/2022   Procedure: LEFT HEART CATH AND CORONARY ANGIOGRAPHY;  Surgeon: Martinique, Chi Woodham M, MD;  Location: [place] [facility];  Service: Cardiovascular;  Laterality: N/A;   TUBAL LIGATION      Current Medications: No outpatient medications have been marked as  taking for the 02/07/23 encounter (Appointment) with Martinique, Tavaughn Silguero M, MD.     Allergies:   Penicillins   Social History   Socioeconomic History   Marital status: Married    Spouse name: Not on file   Number of children: Not on file   Years of education: Not on file   Highest education level: Not on file  Occupational History   Not on file  Tobacco Use   Smoking status: Never   Smokeless tobacco: Never  Vaping Use   Vaping Use: Never used  Substance and Sexual Activity   Alcohol use: No   Drug use: No   Sexual activity: Not on file  Other Topics Concern   Not on file  Social History Narrative   Caffiene sprite occasional. (Or Sprite zero).    Education HS grad   Retired:  Research officer, trade union.    Social Determinants of Health   Financial Resource Strain: Not on file  Food Insecurity: Not on file  Transportation Needs: Not on file  Physical Activity: Not on file  Stress: Not on file  Social Connections: Not on file     Family History: The patient's ***family history includes Cancer (age of onset: 44) in her mother; Diabetes in her father; Hypertension in her brother; Thyroid disease in her sister, sister, and sister.  ROS:   Please see the history of present illness.    *** All other systems reviewed and are negative.  EKGs/Labs/Other Studies Reviewed:    The following studies were reviewed today: Echo 08/09/22: IMPRESSIONS     1. Left ventricular ejection fraction, by estimation, is 60 to 65%. The  left ventricle has normal function. The left ventricle has no regional  wall motion abnormalities. Left ventricular diastolic parameters are  consistent with Grade I diastolic  dysfunction (impaired relaxation).   2. Right ventricular systolic function is mildly reduced. The right  ventricular size is normal. Tricuspid regurgitation signal is inadequate  for assessing PA pressure.   3. Left atrial size was mildly dilated.   4. The mitral valve is abnormal. Mild  mitral valve regurgitation.   5. The aortic valve is tricuspid. Aortic valve regurgitation is not  visualized.   6. The inferior vena cava is normal in size with greater than 50%  respiratory variability, suggesting right atrial pressure of 3 mmHg.   Cardiac cath/PCI 08/09/22: Procedures  Coronary/Graft Acute MI Revascularization  LEFT HEART CATH AND CORONARY ANGIOGRAPHY   Conclusion      RPAV lesion is 100% stenosed.   Mid RCA lesion is 30% stenosed.   Mid LAD lesion is 60% stenosed.   2nd Diag lesion is 70% stenosed.   1st Diag lesion is 50% stenosed.   1st Mrg lesion is 50% stenosed.   A drug-eluting stent was successfully placed using a SYNERGY XD 2.50X20.   Post intervention, there is a 0% residual stenosis.   The left ventricular systolic function is normal.   LV end diastolic pressure is mildly elevated.   The left ventricular ejection fraction is 55-65% by visual estimate.   Acute occlusion of large PL branch of the RCA Diffuse moderate disease in  small caliber LAD and diagonal vessels.  Good LV function with inferobasal HK Mildly elevated LVEDP 20 mm Hg Successful PCI of the PL branch of the RCA with DES x 1.    Plan: DAPT with ASA and Brilinta for one year. May be a candidate for fast track discharge depending on clinical course.     Diagnostic Dominance: Right  Intervention      EKG:  EKG is *** ordered today.  The ekg ordered today demonstrates ***  Recent Labs: 08/12/2022: BUN 12; Creatinine, Ser 1.15; Hemoglobin 14.1; Platelets 337; Potassium 5.1; Sodium 142 11/01/2022: ALT 31  Recent Lipid Panel    Component Value Date/Time   CHOL 116 10/01/2022 0931   TRIG 127 10/01/2022 0931   HDL 39 (L) 10/01/2022 0931   CHOLHDL 3.0 10/01/2022 0931   CHOLHDL 3.4 08/08/2022 2238   VLDL 38 08/08/2022 2238   LDLCALC 54 10/01/2022 0931   Dated 12/09/22: A1c 6%. Cholesterol 112, triglycerides 136, HDL 37, LDL 51, CMET and CBC normal.   Risk  Assessment/Calculations:   {Does this patient have ATRIAL FIBRILLATION?:[phone]}  No BP recorded.  {Refresh Note OR Click here to enter BP  :1}***         Physical Exam:    VS:  There were no vitals taken for this visit.    Wt Readings from Last 3 Encounters:  12/03/22 177 lb 0.5 oz (80.3 kg)  11/01/22 180 lb 1.9 oz (81.7 kg)  11/01/22 180 lb 6.4 oz (81.8 kg)     GEN: *** Well nourished, well developed in no acute distress HEENT: Normal NECK: No JVD; No carotid bruits LYMPHATICS: No lymphadenopathy CARDIAC: ***RRR, no murmurs, rubs, gallops RESPIRATORY:  Clear to auscultation without rales, wheezing or rhonchi  ABDOMEN: Soft, non-tender, non-distended MUSCULOSKELETAL:  No edema; No deformity  SKIN: Warm and dry NEUROLOGIC:  Alert and oriented x 3 PSYCHIATRIC:  Normal affect   ASSESSMENT:    No diagnosis found. PLAN:    In order of problems listed above:  1. CAD: s/p STEMI, DES-RCA in 07/2022. Consideration of outpatient Myoview in 3 to 6 months was recommended to reassess residual LAD stenosis and evaluate for anterior wall ischemia, however, in the absence of symptoms this was deferred.  Stable with no anginal symptoms.  She does note some ongoing fatigue, continue to monitor symptoms. If she has persistent fatigue, could consider stress test at next follow-up.  Continue aspirin, Brilinta, metoprolol, and Crestor.  2. CVA: S/p 2 small cortical infarcts in the left parietal lobe, chronic microhemorrhage in the right corona radiata, and tiny chronic infarct in the left cerebellum. Recent echo showed EF 60 to 65%, normal LV function, no RWMA, G1 DD, reduced RV systolic function, mild LA enlargement, and mild mitral valve regurgitation.  Dopplers were unremarkable.  Cardiac monitor was ordered.  Patient states she wore the monitor for a week, the monitor fell off.  She returned to the vendor, however, they never received the monitor.  She does not wish to repeat monitor at this  time.  She denies palpitations, dizziness, presyncope, or syncope. Would defer to neurology, could consider loop recorder vs 30 day monitor is she has any concerning symptoms in the future. Continue aspirin, Crestor.   3. Hypertension: BP well controlled. Continue current antihypertensive regimen.    4. Hyperlipidemia:  LDL was 54 in 09/2022.  ALT was slightly elevated on most recent labs.  We will repeat LFTs today.  Continue aspirin, Crestor.        {  Are you ordering a CV Procedure (e.g. stress test, cath, DCCV, TEE, etc)?   Press F2        :[id number]    Medication Adjustments/Labs and Tests Ordered: Current medicines are reviewed at length with the patient today.  Concerns regarding medicines are outlined above.  No orders of the defined types were placed in this encounter.  No orders of the defined types were placed in this encounter.   There are no Patient Instructions on file for this visit.   Signed, Rylah Fukuda [place], MD  02/03/2023 7:34 AM    [place]

## 2023-02-07 ENCOUNTER — Encounter: Payer: Self-pay | Admitting: Cardiology

## 2023-02-07 ENCOUNTER — Ambulatory Visit: Payer: Medicare HMO | Attending: Cardiology | Admitting: Cardiology

## 2023-02-07 VITALS — BP 128/72 | HR 81 | Ht 64.0 in | Wt 181.0 lb

## 2023-02-07 DIAGNOSIS — I10 Essential (primary) hypertension: Secondary | ICD-10-CM | POA: Diagnosis not present

## 2023-02-07 DIAGNOSIS — E785 Hyperlipidemia, unspecified: Secondary | ICD-10-CM | POA: Diagnosis not present

## 2023-02-07 DIAGNOSIS — I25118 Atherosclerotic heart disease of native coronary artery with other forms of angina pectoris: Secondary | ICD-10-CM | POA: Diagnosis not present

## 2023-02-07 NOTE — Patient Instructions (Signed)
Medication Instructions:  No changes *If you need a refill on your cardiac medications before your next appointment, please call your pharmacy*  Testing/Procedures: CARDIAC PET- Your physician has requested that you have a Cardiac Pet Stress Test. [facility] [place] [address] The schedulers will call you to get this scheduled. Please follow instructions below and call the office with any questions/concerns [phone]).   How to Prepare for Your Cardiac PET/CT Stress Test:  1. Please do not take these medications before your test:   Medications that may interfere with the cardiac pharmacological stress agent (ex. nitrates - including erectile dysfunction medications, isosorbide mononitrate, tamulosin or beta-blockers) the day of the exam. (Erectile dysfunction medication should be held for at least 72 hrs prior to test) Theophylline containing medications for 12 hours. Dipyridamole 48 hours prior to the test. Your remaining medications may be taken with water.  2. Nothing to eat or drink, except water, 3 hours prior to arrival time.   NO caffeine/decaffeinated products, or chocolate 12 hours prior to arrival.  3. NO perfume, cologne or lotion  4. Total time is 1 to 2 hours; you may want to bring reading material for the waiting time.  5. Please report to Radiology at the [facility] Main Entr[facility] 30 minutes early for your test.  [place], [place] [address]  Diabetic Preparation:  Hold oral medications. You may take NPH and Lantus insulin. Do not take Humalog or Humulin R (Regular Insulin) the day of your test. Check blood sugars prior to leaving the house. If able to eat breakfast prior to 3 hour fasting, you may take all medications, including your insulin, Do not worry if you miss your breakfast dose of insulin - start at your next meal.  IF YOU THINK YOU MAY BE PREGNANT, OR  ARE NURSING PLEASE INFORM THE TECHNOLOGIST.  In preparation for your appointment, medication and supplies will be purchased.  Appointment availability is limited, so if you need to cancel or reschedule, please call the Radiology Department at [phone]  24 hours in advance to avoid a cancellation fee of $100.00  What to Expect After you Arrive:  Once you arrive and check in for your appointment, you will be taken to a preparation room within the Radiology Department.  A technologist or Nurse will obtain your medical history, verify that you are correctly prepped for the exam, and explain the procedure.  Afterwards,  an IV will be started in your arm and electrodes will be placed on your skin for EKG monitoring during the stress portion of the exam. Then you will be escorted to the PET/CT scanner.  There, staff will get you positioned on the scanner and obtain a blood pressure and EKG.  During the exam, you will continue to be connected to the EKG and blood pressure machines.  A small, safe amount of a radioactive tracer will be injected in your IV to obtain a series of pictures of your heart along with an injection of a stress agent.    After your Exam:  It is recommended that you eat a meal and drink a caffeinated beverage to counter act any effects of the stress agent.  Drink plenty of fluids for the remainder of the day and urinate frequently for the first couple of hours after the exam.  Your doctor will inform you of your test results within 7-10 business days.  For questions about  your test or how to prepare for your test, please call: Marchia Bond, Cardiac Imaging Nurse Navigator  Gordy Clement, Cardiac Imaging Nurse Navigator Office: [phone]    Follow-Up: At [facility], you and your health needs are our priority.  As part of our continuing mission to provide you with exceptional heart care, we have created designated Provider Care Teams.  These Care Teams include your  primary Cardiologist (physician) and Advanced Practice Providers (APPs -  Physician Assistants and Nurse Practitioners) who all work together to provide you with the care you need, when you need it.  We recommend signing up for the patient portal called "MyChart".  Sign up information is provided on this After Visit Summary.  MyChart is used to connect with patients for Virtual Visits (Telemedicine).  Patients are able to view lab/test results, encounter notes, upcoming appointments, etc.  Non-urgent messages can be sent to your provider as well.   To learn more about what you can do with MyChart, go to [email or web address].    Your next appointment:   6 month(s)  Provider:   Peter Martinique, MD

## 2023-02-17 ENCOUNTER — Other Ambulatory Visit: Payer: Self-pay

## 2023-02-22 NOTE — Progress Notes (Unsigned)
No chief complaint on file.     ASSESSMENT AND PLAN  Katherine Short is a 72 y.o. female   Left parietal lobe stroke  Small vessel disease, likely incidental findings, the location and the small size would not explain her left-sided symptoms,  She does have multiple vascular risk factors, aging, hypertension, hyperlipidemia, obesity, prediabetes,   Carotid ultrasound bilateral ICA near normal  Continue aspirin and Brilinta for secondary stroke prevention measures and per cardiology recommendations s/p stent  Sleep study 12/2022 negative for OSA  Cervical degenerative changes  Likely explain her left neck pain, shoulder, left arm paresthesia  Left hip pain, low back pain  X-ray of left hip, lumbar  Return to clinic in 6 months with nurse practitioner     DIAGNOSTIC DATA (LABS, IMAGING, TESTING) - I reviewed patient records, labs, notes, testing and imaging myself where available.  MRI brain 08/13/2022 IMPRESSION: 1. Positive for two tiny cortical infarcts in the left parietal lobe - right side sensory representation area. No associated hemorrhage or mass effect. 2. Otherwise advanced but nonspecific signal changes in the bilateral cerebral white matter, most commonly due to chronic small vessel disease. Possible chronic microhemorrhage in the right corona radiata, and tiny chronic infarct in the left cerebellum.  MR cervical spine 08/13/2022 IMPRESSION: 1. 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 2. Multifactorial mild spinal stenosis and spinal cord mass effect at C4-C5, and to a lesser extent C5-C6. No spinal cord signal abnormality. 3. Moderate to severe degenerative neural foraminal stenosis at the bilateral C4, bilateral C5, and right C6 nerve levels.  DG lumbar spine 08/24/2022 IMPRESSION: 1. No fracture or acute finding. 2. Degenerative changes including a grade 1 anterolisthesis of L3 on L4, stable  from the prior radiographs.  DG HIP left 08/24/2022 IMPRESSION: Negative.     MEDICAL HISTORY:  Update 02/23/2023 JM: Patient returns for 34-monthstroke follow-up.   Compliant on aspirin and Crestor as well as Brilinta, routinely follows with cardiology Blood pressure ***     Consult visit 08/24/2022 Dr. YKrista Blue Katherine GUILLENis a 72year old right-handed female, accompanied by her 2 sisters, seen in request by her primary care nurse practitioner MDiona Brownerfor evaluation of stroke, initial evaluation was on August 29 23  I reviewed and summarized the referring note.  Past medical history STEMI August 10, 2022, status post PCI, on aspirin and Brilinta dual antiplatelet agent Hypertension Hyperlipidemia Pre-DM GERD.   She had a heart attack August 10, 2022, had a stent put in, was put on dual antiplatelet agent aspirin plus Brilinta for 1 year, laboratory evaluations, triglyceride 188, LDL 56, A1c 5.7  August 13, 2022, she had sudden onset of numbness at left lateral arm, lateral leg, prompting her emergency room visit, over the past few weeks, she had worsening left hip pain, difficult to sleep on her left side, also complains of left neck pain, radiating pain to left shoulder  Personally reviewed MRI of the brain, 2 tiny positive DWI lesion at the left parietal region, right sensory representation area, moderate small vessel disease,  MRI of cervical spine showed multilevel degenerative changes, moderate to severe foraminal narrowing at bilateral C4, C5, right C6, no evidence of spinal cord compression  Echocardiogram showed no significant abnormality Ultrasound of carotid arteries pending at cardiologist office August 25, 2022 She is wearing 2 weeks cardiac monitoring  She has small jaw, snore, catching her breath in her sleep, have daytime sleepiness and fatigue  PHYSICAL EXAM:   There were no vitals filed for this visit.  Not recorded     There is no height  or weight on file to calculate BMI.  PHYSICAL EXAMNIATION:  Gen: NAD, conversant, well nourised, well groomed                     Cardiovascular: Regular rate rhythm, no peripheral edema, warm, nontender. Eyes: Conjunctivae clear without exudates or hemorrhage Neck: Supple, no carotid bruits. Pulmonary: Clear to auscultation bilaterally   NEUROLOGICAL EXAM:  MENTAL STATUS: Speech/cognition: Awake, alert, oriented to history taking and casual conversation CRANIAL NERVES: CN II: Visual fields are full to confrontation. Pupils are round equal and briskly reactive to light. CN III, IV, VI: extraocular movement are normal. No ptosis. CN V: Facial sensation is intact to light touch CN VII: Face is symmetric with normal eye closure  CN VIII: Hearing is normal to causal conversation. CN IX, X: Phonation is normal. CN XI: Head turning and shoulder shrug are intact CN XII: Narrow oropharyngeal space MOTOR: There is no pronator drift of out-stretched arms. Muscle bulk and tone are normal. Muscle strength is normal.,  Cross leg testing showed left hip pain  REFLEXES: Reflexes are 2+ and symmetric at the biceps, triceps, knees, and ankles. Plantar responses are flexor.  SENSORY: Intact to light touch, pinprick and vibratory sensation are intact in fingers and toes.  COORDINATION: There is no trunk or limb dysmetria noted.  GAIT/STANCE: Posture is normal. Gait is steady with normal steps, base, arm swing, and turning. Heel and toe walking are normal. Tandem gait is normal.  Romberg is absent.  REVIEW OF SYSTEMS:  Full 14 system review of systems performed and notable only for as above All other review of systems were negative.   ALLERGIES: Allergies  Allergen Reactions   Penicillins Itching and Rash         HOME MEDICATIONS: Current Outpatient Medications  Medication Sig Dispense Refill   acetaminophen (TYLENOL) 500 MG tablet Take 500 mg by mouth every 6 (six) hours as  needed for moderate pain.     acyclovir (ZOVIRAX) 200 MG capsule Take 200 mg by mouth 2 (two) times daily.      aspirin EC 81 MG tablet Take 1 tablet (81 mg total) by mouth daily. Swallow whole. 90 tablet 3   gabapentin (NEURONTIN) 300 MG capsule Take 300 mg by mouth 3 (three) times daily.  1   meclizine (ANTIVERT) 25 MG tablet Take 1 tablet (25 mg total) by mouth 4 (four) times daily as needed for dizziness. (Patient taking differently: Take 25 mg by mouth 3 (three) times daily as needed for dizziness.) 40 tablet 0   metoprolol tartrate (LOPRESSOR) 25 MG tablet Take 1/2 tablet (12.5 mg total) by mouth 2 (two) times daily. 90 tablet 3   nitroGLYCERIN (NITROSTAT) 0.4 MG SL tablet Place 1 tablet (0.4 mg total) under the tongue every 5 (five) minutes as needed for chest pain. 25 tablet 0   pantoprazole (PROTONIX) 40 MG tablet Take 40 mg by mouth daily.     rosuvastatin (CRESTOR) 40 MG tablet Take 1 tablet (40 mg total) by mouth daily. 90 tablet 3   ticagrelor (BRILINTA) 90 MG TABS tablet Take 1 tablet (90 mg total) by mouth 2 (two) times daily. 180 tablet 2   WEGOVY 1 MG/0.5ML SOAJ Inject 1 mg into the skin as directed.     No current facility-administered medications for this visit.    PAST  MEDICAL HISTORY: Past Medical History:  Diagnosis Date   Anxiety    Chest pain 03/19/2010   nuclear study was negative   Depression    Dyslipidemia    GERD (gastroesophageal reflux disease)    Headache(784.0)    Hypertension    Obstructive sleep apnea 08/24/2022   STEMI (ST elevation myocardial infarction) ([place])    Stroke [facility])     PAST SURGICAL HISTORY: Past Surgical History:  Procedure Laterality Date   BREAST LUMPECTOMY WITH RADIOACTIVE SEED LOCALIZATION Left 07/07/2017   Procedure: LEFT BREAST LUMPECTOMY WITH RADIOACTIVE SEED LOCALIZATION;  Surgeon: Jovita Kussmaul, MD;  Location: [place];  Service: General;  Laterality: Left;   BREAST LUMPECTOMY WITH RADIOACTIVE SEED LOCALIZATION Left 07/18/2017    Procedure: LEFT BREAST LUMPECTOMY WITH RADIOACTIVE SEED LOCALIZATION;  Surgeon: Jovita Kussmaul, MD;  Location: [place];  Service: General;  Laterality: Left;   CATARACT EXTRACTION W/PHACO  11/22/2011   Procedure: CATARACT EXTRACTION PHACO AND INTRAOCULAR LENS PLACEMENT ([place]);  Surgeon: Tonny Branch;  Location: [facility];  Service: Ophthalmology;  Laterality: Left;  CDE:7.76   CATARACT EXTRACTION W/PHACO  12/02/2011   Procedure: CATARACT EXTRACTION PHACO AND INTRAOCULAR LENS PLACEMENT ([facility]);  Surgeon: Tonny Branch;  Location: [facility];  Service: Ophthalmology;  Laterality: Right;  CDE=11.92   COLONOSCOPY     CORONARY/GRAFT ACUTE MI REVASCULARIZATION N/A 08/08/2022   Procedure: Coronary/Graft Acute MI Revascularization;  Surgeon: Martinique, Peter M, MD;  Location: [place] [facility];  Service: Cardiovascular;  Laterality: N/A;   LEFT HEART CATH AND CORONARY ANGIOGRAPHY N/A 08/08/2022   Procedure: LEFT HEART CATH AND CORONARY ANGIOGRAPHY;  Surgeon: Martinique, Peter M, MD;  Location: [place] [facility];  Service: Cardiovascular;  Laterality: N/A;   TUBAL LIGATION      FAMILY HISTORY: Family History  Problem Relation Age of Onset   Cancer Mother 58       breast   Diabetes Father    Thyroid disease Sister    Thyroid disease Sister    Thyroid disease Sister    Hypertension Brother     SOCIAL HISTORY: Social History   Socioeconomic History   Marital status: Married    Spouse name: Not on file   Number of children: Not on file   Years of education: Not on file   Highest education level: Not on file  Occupational History   Not on file  Tobacco Use   Smoking status: Never   Smokeless tobacco: Never  Vaping Use   Vaping Use: Never used  Substance and Sexual Activity   Alcohol use: No   Drug use: No   Sexual activity: Not on file  Other Topics Concern   Not on file  Social History Narrative   Caffiene sprite occasional. (Or Sprite zero).    Education HS grad   Retired:   Research officer, trade union.    Social Determinants of Health   Financial Resource Strain: Not on file  Food Insecurity: Not on file  Transportation Needs: Not on file  Physical Activity: Not on file  Stress: Not on file  Social Connections: Not on file  Intimate Partner Violence: Not on file      I spent *** minutes of face-to-face and non-face-to-face time with patient.  This included previsit chart review, lab review, study review, order entry, electronic health record documentation, patient education  Frann Rider, [facility]  [facility] [address] [place], [place] [address]  Phone [phone] Fax [phone] Note: This document  was prepared with digital dictation and possible smart phrase technology. Any transcriptional errors that result from this process are unintentional.

## 2023-02-23 ENCOUNTER — Encounter: Payer: Self-pay | Admitting: Adult Health

## 2023-02-23 ENCOUNTER — Ambulatory Visit (INDEPENDENT_AMBULATORY_CARE_PROVIDER_SITE_OTHER): Payer: Medicare HMO | Admitting: Adult Health

## 2023-02-23 VITALS — BP 116/78 | HR 59 | Ht 64.0 in | Wt 175.6 lb

## 2023-02-23 DIAGNOSIS — E538 Deficiency of other specified B group vitamins: Secondary | ICD-10-CM | POA: Diagnosis not present

## 2023-02-23 DIAGNOSIS — E559 Vitamin D deficiency, unspecified: Secondary | ICD-10-CM | POA: Diagnosis not present

## 2023-02-23 DIAGNOSIS — I639 Cerebral infarction, unspecified: Secondary | ICD-10-CM

## 2023-02-23 DIAGNOSIS — G4719 Other hypersomnia: Secondary | ICD-10-CM

## 2023-02-23 NOTE — Patient Instructions (Addendum)
We will check B12 and thyroid levels today - if these are normal, please ensure you follow up with your primary doctor sooner to discuss ongoing fatigue and insomnia concerns.   Some fatigue may be coming from morning dose of gabapentin, can try to take just in the evening time (instead of taking '300mg'$  AM and '600mg'$  PM, you can take '900mg'$  PM)  Continue aspirin 81 mg daily  and Crestor for secondary stroke prevention  Continue to follow up with PCP regarding blood pressure and cholesterol management  Maintain strict control of hypertension with blood pressure goal below 130/90 and cholesterol with LDL cholesterol (bad cholesterol) goal below 70 mg/dL.   Signs of a Stroke? Follow the BEFAST method:  Balance Watch for a sudden loss of balance, trouble with coordination or vertigo Eyes Is there a sudden loss of vision in one or both eyes? Or double vision?  Face: Ask the person to smile. Does one side of the face droop or is it numb?  Arms: Ask the person to raise both arms. Does one arm drift downward? Is there weakness or numbness of a leg? Speech: Ask the person to repeat a simple phrase. Does the speech sound slurred/strange? Is the person confused ? Time: If you observe any of these signs, call 911.        Thank you for coming to see [place] at [facility]. I hope we have been able to provide you high quality care today.  You may receive a patient satisfaction survey over the next few weeks. We would appreciate your feedback and comments so that we may continue to improve ourselves and the health of our patients.

## 2023-02-24 LAB — VITAMIN D 25 HYDROXY (VIT D DEFICIENCY, FRACTURES): Vit D, 25-Hydroxy: 43.4 ng/mL (ref 30.0–100.0)

## 2023-02-24 LAB — TSH: TSH: 2.33 u[IU]/mL (ref 0.450–4.500)

## 2023-02-24 LAB — VITAMIN B12: Vitamin B-12: 347 pg/mL (ref 232–1245)

## 2023-02-25 ENCOUNTER — Telehealth (HOSPITAL_COMMUNITY): Payer: Self-pay | Admitting: Emergency Medicine

## 2023-02-25 NOTE — Telephone Encounter (Signed)
Reaching out to patient to offer assistance regarding upcoming cardiac imaging study; pt verbalizes understanding of appt date/time, parking situation and where to check in, pre-test NPO status and medications ordered, and verified current allergies; name and call back number provided for further questions should they arise Marchia Bond RN Navigator Cardiac Imaging [facility] [phone] office [phone] cell  Arrival [address]  Denies iv issues No food 3 h No caffeine 12 h Holding metoprolol

## 2023-03-01 ENCOUNTER — Encounter (HOSPITAL_COMMUNITY): Payer: Medicare HMO

## 2023-03-07 ENCOUNTER — Other Ambulatory Visit: Payer: Self-pay | Admitting: Emergency Medicine

## 2023-03-07 DIAGNOSIS — I25118 Atherosclerotic heart disease of native coronary artery with other forms of angina pectoris: Secondary | ICD-10-CM

## 2023-03-09 ENCOUNTER — Telehealth: Payer: Self-pay | Admitting: Cardiology

## 2023-03-09 ENCOUNTER — Other Ambulatory Visit: Payer: Self-pay | Admitting: Cardiology

## 2023-03-09 ENCOUNTER — Telehealth (HOSPITAL_COMMUNITY): Payer: Self-pay | Admitting: *Deleted

## 2023-03-09 DIAGNOSIS — I25118 Atherosclerotic heart disease of native coronary artery with other forms of angina pectoris: Secondary | ICD-10-CM

## 2023-03-09 DIAGNOSIS — R0789 Other chest pain: Secondary | ICD-10-CM

## 2023-03-09 DIAGNOSIS — I2089 Other forms of angina pectoris: Secondary | ICD-10-CM

## 2023-03-09 NOTE — Telephone Encounter (Signed)
Pt reached and given instructions for MPI study scheduled on 03/10/23. 

## 2023-03-09 NOTE — Telephone Encounter (Signed)
Cardiac PET was denied by insurance- would you like to order Lexi instead?   Will send to MD/RN before contacting patient to advise of further steps.   Thanks!

## 2023-03-09 NOTE — Telephone Encounter (Signed)
Spoke to patient Dr.Jordan ordered a Lexiscan.Scheduler will call back to schedule.

## 2023-03-09 NOTE — Telephone Encounter (Signed)
Patient is concerned because her PET scan was cancelled and she states no one spoke with her to tell her why. I informed her, per notes, the authorization was denied by insurance and a message was sent to MD/RN. Patient would like to know what her next steps are.

## 2023-03-10 ENCOUNTER — Ambulatory Visit (HOSPITAL_COMMUNITY): Payer: Medicare HMO | Attending: Cardiology

## 2023-03-10 VITALS — Ht 64.0 in | Wt 175.0 lb

## 2023-03-10 DIAGNOSIS — I25118 Atherosclerotic heart disease of native coronary artery with other forms of angina pectoris: Secondary | ICD-10-CM | POA: Diagnosis present

## 2023-03-10 DIAGNOSIS — R0789 Other chest pain: Secondary | ICD-10-CM | POA: Diagnosis present

## 2023-03-10 DIAGNOSIS — I10 Essential (primary) hypertension: Secondary | ICD-10-CM | POA: Diagnosis present

## 2023-03-10 DIAGNOSIS — E785 Hyperlipidemia, unspecified: Secondary | ICD-10-CM | POA: Diagnosis present

## 2023-03-10 MED ORDER — TECHNETIUM TC 99M TETROFOSMIN IV KIT
32.7000 | PACK | Freq: Once | INTRAVENOUS | Status: AC | PRN
  Administered 2023-03-10: 32.7 via INTRAVENOUS

## 2023-03-10 MED ORDER — TECHNETIUM TC 99M TETROFOSMIN IV KIT
11.0000 | PACK | Freq: Once | INTRAVENOUS | Status: AC | PRN
  Administered 2023-03-10: 11 via INTRAVENOUS

## 2023-03-10 MED ORDER — REGADENOSON 0.4 MG/5ML IV SOLN
0.4000 mg | Freq: Once | INTRAVENOUS | Status: AC
  Administered 2023-03-10: 0.4 mg via INTRAVENOUS

## 2023-03-10 MED ORDER — AMINOPHYLLINE 25 MG/ML IV SOLN
75.0000 mg | Freq: Once | INTRAVENOUS | Status: AC
  Administered 2023-03-10: 75 mg via INTRAVENOUS

## 2023-03-11 LAB — MYOCARDIAL PERFUSION IMAGING
LV dias vol: 42 mL (ref 46–106)
LV sys vol: 13 mL
Nuc Stress EF: 69 %
Peak HR: 93 {beats}/min
Rest HR: 71 {beats}/min
Rest Nuclear Isotope Dose: 11 mCi
SDS: 0
SRS: 2
SSS: 2
ST Depression (mm): 0 mm
Stress Nuclear Isotope Dose: 32.7 mCi
TID: 1

## 2023-05-13 ENCOUNTER — Other Ambulatory Visit: Payer: Self-pay | Admitting: Nurse Practitioner

## 2023-05-13 ENCOUNTER — Telehealth: Payer: Self-pay | Admitting: *Deleted

## 2023-05-13 NOTE — Telephone Encounter (Signed)
   Pre-operative Risk Assessment    Patient Name: Katherine Short  DOB: 1951-08-06 MRN: [id number]      Request for Surgical Clearance    Procedure:   COLONOSCOPY  Date of Surgery:  Clearance 09/05/23                                 Surgeon:  DR. MANN Surgeon's Group or Practice Name:  [facility] Phone number:  [phone] Fax number:  [phone]   Type of Clearance Requested:   NEED TO HOLD BRILINTA   Type of Anesthesia:   PROPOFOL   Additional requests/questions:    Elpidio Anis   05/13/2023, 1:26 PM

## 2023-05-13 NOTE — Telephone Encounter (Signed)
Primary Cardiologist:Peter Swaziland, MD  Chart reviewed as part of pre-operative protocol coverage. Because of Katherine Short's past medical history and time since last visit, he/she will require a follow-up visit in order to better assess preoperative cardiovascular risk.  Pre-op covering staff: -Patient has an appointment 8/26 at which time clearance can be addressed. Appointment notes have been updated. - Please contact requesting surgeon's office via preferred method (i.e, phone, fax) to inform them of need for appointment prior to surgery.  Levi Aland, NP-C  05/13/2023, 1:36 PM [address] Office [phone] Fax [phone]

## 2023-05-13 NOTE — Telephone Encounter (Signed)
Pt has appt 08/22/23 with Dr. Swaziland. Per pre op APP, clearance to be addressed at appt. I will update all parties involved.

## 2023-05-18 ENCOUNTER — Other Ambulatory Visit: Payer: Self-pay | Admitting: Nurse Practitioner

## 2023-05-27 ENCOUNTER — Telehealth: Payer: Self-pay | Admitting: Cardiology

## 2023-05-27 NOTE — Telephone Encounter (Signed)
Katherine Short from [facility] called in stating she needs pt seen today because they fell twice. Please advise.

## 2023-05-27 NOTE — Telephone Encounter (Signed)
Call from [facility] stating patient needs to be seen today.  When speaking with nurse, Lupita Leash, she states patient has fallen twice in 2 months and continued dizziness since her heart attack.  Found it is not emergent, for patient to be seen.  The doctor ask she be seen by cardiology rather than PCP.  Offerred appt next week on Tuesday afternoon, but patient ask for morning appt.  Advised the next morning appt was next Friday and she asks for that one.  Scheduled with NP for June 7. Advised that appt is at the [facility].  [facility] will ensure she has directions

## 2023-05-28 ENCOUNTER — Other Ambulatory Visit: Payer: Self-pay | Admitting: Nurse Practitioner

## 2023-06-03 ENCOUNTER — Encounter (HOSPITAL_BASED_OUTPATIENT_CLINIC_OR_DEPARTMENT_OTHER): Payer: Self-pay | Admitting: Family

## 2023-06-03 ENCOUNTER — Telehealth: Payer: Self-pay | Admitting: Cardiology

## 2023-06-03 ENCOUNTER — Other Ambulatory Visit (INDEPENDENT_AMBULATORY_CARE_PROVIDER_SITE_OTHER): Payer: Medicare HMO

## 2023-06-03 ENCOUNTER — Ambulatory Visit (INDEPENDENT_AMBULATORY_CARE_PROVIDER_SITE_OTHER): Payer: Medicare HMO | Admitting: Family

## 2023-06-03 VITALS — BP 108/69 | HR 71 | Ht 64.0 in | Wt 167.4 lb

## 2023-06-03 DIAGNOSIS — I10 Essential (primary) hypertension: Secondary | ICD-10-CM

## 2023-06-03 DIAGNOSIS — R55 Syncope and collapse: Secondary | ICD-10-CM

## 2023-06-03 DIAGNOSIS — R296 Repeated falls: Secondary | ICD-10-CM

## 2023-06-03 DIAGNOSIS — W19XXXA Unspecified fall, initial encounter: Secondary | ICD-10-CM

## 2023-06-03 DIAGNOSIS — E785 Hyperlipidemia, unspecified: Secondary | ICD-10-CM

## 2023-06-03 DIAGNOSIS — Z8673 Personal history of transient ischemic attack (TIA), and cerebral infarction without residual deficits: Secondary | ICD-10-CM | POA: Diagnosis not present

## 2023-06-03 DIAGNOSIS — I25118 Atherosclerotic heart disease of native coronary artery with other forms of angina pectoris: Secondary | ICD-10-CM | POA: Diagnosis not present

## 2023-06-03 NOTE — Telephone Encounter (Signed)
Patient stated she had heart monitor placed today and she pressed the button but did not see the green light.

## 2023-06-03 NOTE — Telephone Encounter (Signed)
Called and spoke to pt. She stated she was driving and was not able to look at the monitor to see if the light came on or not.   Informed pt to check it when she is able to look in a mirror so she will be able to see it. Explained to push the monitor button and hold it until she sees a green light flash. This means the monitor is working. Also explained if the monitor does not flash a light, or flashes orange, something is wrong with the monitor and we will need to send her a new one.  Pt stated she is going to try to pull over in her car and check it in that mirror and she will let us know if it is not working. Pt verbalized thanks for the call.

## 2023-06-03 NOTE — Progress Notes (Signed)
Office Visit    Patient Name: Katherine Short Date of Encounter: 06/03/2023  PCP:  Leone Payor, FNP   [facility]  Cardiologist:  Peter Swaziland, MD  Advanced Practice Provider:  No care team member to display Electrophysiologist:  None      Chief Complaint    Katherine Short is a 72 y.o. female presents today for dyspnea   Past Medical History    Past Medical History:  Diagnosis Date   Anxiety    Chest pain 03/19/2010   nuclear study was negative   Depression    Dyslipidemia    GERD (gastroesophageal reflux disease)    Headache(784.0)    Hypertension    Obstructive sleep apnea 08/24/2022   STEMI (ST elevation myocardial infarction) (HCC)    Stroke [facility])    Past Surgical History:  Procedure Laterality Date   BREAST LUMPECTOMY WITH RADIOACTIVE SEED LOCALIZATION Left 07/07/2017   Procedure: LEFT BREAST LUMPECTOMY WITH RADIOACTIVE SEED LOCALIZATION;  Surgeon: Griselda Miner, MD;  Location: [facility] OR;  Service: General;  Laterality: Left;   BREAST LUMPECTOMY WITH RADIOACTIVE SEED LOCALIZATION Left 07/18/2017   Procedure: LEFT BREAST LUMPECTOMY WITH RADIOACTIVE SEED LOCALIZATION;  Surgeon: Griselda Miner, MD;  Location: [facility];  Service: General;  Laterality: Left;   CATARACT EXTRACTION W/PHACO  11/22/2011   Procedure: CATARACT EXTRACTION PHACO AND INTRAOCULAR LENS PLACEMENT (IOC);  Surgeon: Gemma Payor;  Location: [facility];  Service: Ophthalmology;  Laterality: Left;  CDE:7.76   CATARACT EXTRACTION W/PHACO  12/02/2011   Procedure: CATARACT EXTRACTION PHACO AND INTRAOCULAR LENS PLACEMENT (IOC);  Surgeon: Gemma Payor;  Location: [facility];  Service: Ophthalmology;  Laterality: Right;  CDE=11.92   COLONOSCOPY     CORONARY/GRAFT ACUTE MI REVASCULARIZATION N/A 08/08/2022   Procedure: Coronary/Graft Acute MI Revascularization;  Surgeon: Swaziland, Peter M, MD;  Location: [facility] INVASIVE CV LAB;  Service: Cardiovascular;  Laterality: N/A;   LEFT HEART CATH  AND CORONARY ANGIOGRAPHY N/A 08/08/2022   Procedure: LEFT HEART CATH AND CORONARY ANGIOGRAPHY;  Surgeon: Swaziland, Peter M, MD;  Location: [facility] INVASIVE CV LAB;  Service: Cardiovascular;  Laterality: N/A;   TUBAL LIGATION      Allergies  Allergies  Allergen Reactions   Penicillins Itching and Rash         History of Present Illness    Katherine Short is a 72 y.o. female with a hx of CAD s/p STEMI with DES-RCA 07/2022, CVA, hypertension, hyperlipidemia, GERD, anxiety last seen 02/07/23 by Dr. Swaziland.  Presented to the ED 08/08/2022 acute onset chest pain with EKG revealing STEMI.  Underwent emergent LHC with acute occlusion of PL branch of RCA/P DES with otherwise moderate disease and small caliber LAD and diagonal vessels.  Echo LVEF 60 to 65%, no RWMA, grade 1 diastolic dysfunction, reduced RV systolic function, mild LA enlargement, mild MR.  Discharged on aspirin, Brilinta, metoprolol, Crestor.  Repeat ED visit 08/12/2022 with left sided tingling with MRI revealing 2 small cortical infarcts in the left parietal lobe, chronic microhemorrhage in the right corona radiata and tiny chronic infarct in the left cerebellum.  Carotid Dopplers with no stenosis.  Event monitor ordered but never completed.  Recommended for sleep study by neurology with no significant sleep apnea.  Last in 02/07/2013 by Dr. Swaziland noting fatigue and not sleeping well.  Walking 3 miles a day.  She had stable class II angina.  She was recommended for coronary PET however this was transition to [facility]  performed 03/10/2023 with normal perfusion and EF.  Presents today for follow-up. She reports two falls over the last two months. Will get hot/sweaty while walking her 3 miles on a trail with her sister then will fall. Most recently landed on her knees, the previous time landed on her right hip. Does not recall feeling dizzy prior to her fall. No loss of consciousness. Reports no chest pain, pressure, tightness.  She does not eat or  drink prior to taking this walks with her sister.  Does not monitor blood pressure at home.   For breakfast eats some waffles or bowl of cereal, for lunch - does not eat lunch - then a very small dinner. Drinks water or body armor during the day. Does not eat or drink prior or during her walks with her sister.   Persistent difficulties with sleep and recently resumed temazepam.  Notes she has had difficulty with depression which she discussed with her OBGYN last week and remains on Wellbutrin. Notes persistent fatigue since MI, encouraged to discuss with PCP as recent myoview unremarkable.    EKGs/Labs/Other Studies Reviewed:   The following studies were reviewed today: Cardiac Studies & Procedures   CARDIAC CATHETERIZATION  CARDIAC CATHETERIZATION 08/09/2022  Narrative   RPAV lesion is 100% stenosed.   Mid RCA lesion is 30% stenosed.   Mid LAD lesion is 60% stenosed.   2nd Diag lesion is 70% stenosed.   1st Diag lesion is 50% stenosed.   1st Mrg lesion is 50% stenosed.   A drug-eluting stent was successfully placed using a SYNERGY XD 2.50X20.   Post intervention, there is a 0% residual stenosis.   The left ventricular systolic function is normal.   LV end diastolic pressure is mildly elevated.   The left ventricular ejection fraction is 55-65% by visual estimate.  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 Good LV function with inferobasal HK Mildly elevated LVEDP 20 mm Hg Successful PCI of the PL branch of the RCA with DES x 1.  Plan: DAPT with ASA and Brilinta for one year. May be a candidate for fast track discharge depending on clinical course.  Findings Coronary Findings Diagnostic  Dominance: Right  Left Anterior Descending Vessel is small. Mid LAD lesion is 60% stenosed.  First Diagonal Branch Vessel is small in size. 1st Diag lesion is 50% stenosed.  Second Diagonal Branch Vessel is small  in size. 2nd Diag lesion is 70% stenosed.  Left Circumflex  First Obtuse Marginal Branch 1st Mrg lesion is 50% stenosed.  Right Coronary Artery Vessel was injected. Vessel is large. Mid RCA lesion is 30% stenosed.  Right Posterior Atrioventricular Artery Vessel is large in size. RPAV lesion is 100% stenosed.  First Right Posterolateral Branch Vessel is large in size.  Second Right Posterolateral Branch Vessel is large in size.  Intervention  RPAV lesion Stent CATH VISTA GUIDE 6FR JR4 guide catheter was inserted. Lesion crossed with guidewire using a WIRE ASAHI PROWATER 180CM. Pre-stent angioplasty was performed using a BALLN SAPPHIRE 2.0X12. A drug-eluting stent was successfully placed using a SYNERGY XD 2.50X20. Stent strut is well apposed. Post-stent angioplasty was performed using a BALL SAPPHIRE NC24 2.75X15. Maximum pressure:  16 atm. Post-Intervention Lesion Assessment The intervention was successful. Pre-interventional TIMI flow is 0. Post-intervention TIMI flow is 3. No complications occurred at this lesion. There is a 0% residual stenosis post intervention.   STRESS TESTS  MYOCARDIAL PERFUSION IMAGING  03/11/2023  Narrative   The study is normal. The study is low risk.   No ST deviation was noted.   LV perfusion is normal. There is no evidence of ischemia. There is no evidence of infarction.   Left ventricular function is normal. End diastolic cavity size is normal. End systolic cavity size is normal.   Prior study available for comparison from 03/19/2010.  Normal stress nuclear study with no ischemia or infarction; gated EF 69.   ECHOCARDIOGRAM  ECHOCARDIOGRAM COMPLETE 08/09/2022  Narrative ECHOCARDIOGRAM REPORT    Patient Name:   Katherine Short Date of Exam: 08/09/2022 Medical Rec #:  [id number]       Height:       64.0 in Accession #:    [id number]      Weight:       183.0 lb Date of Birth:  [birth year]       BSA:          1.884 m Patient Age:    70  years        BP:           125/73 mmHg Patient Gender: F               HR:           71 bpm. Exam Location:  Inpatient  Procedure: 2D Echo, Color Doppler and Cardiac Doppler  Indications:    Acute MI i21.9  History:        Patient has prior history of Echocardiogram examinations, most recent 03/19/2010. Risk Factors:Hypertension and Dyslipidemia.  Sonographer:    Irving Burton Senior RDCS Referring Phys: [phone] PETER M Swaziland  IMPRESSIONS   1. Left ventricular ejection fraction, by estimation, is 60 to 65%. The left ventricle has normal function. The left ventricle has no regional wall motion abnormalities. Left ventricular diastolic parameters are consistent with Grade I diastolic dysfunction (impaired relaxation). 2. Right ventricular systolic function is mildly reduced. The right ventricular size is normal. Tricuspid regurgitation signal is inadequate for assessing PA pressure. 3. Left atrial size was mildly dilated. 4. The mitral valve is abnormal. Mild mitral valve regurgitation. 5. The aortic valve is tricuspid. Aortic valve regurgitation is not visualized. 6. The inferior vena cava is normal in size with greater than 50% respiratory variability, suggesting right atrial pressure of 3 mmHg.  FINDINGS Left Ventricle: Left ventricular ejection fraction, by estimation, is 60 to 65%. The left ventricle has normal function. The left ventricle has no regional wall motion abnormalities. The left ventricular internal cavity size was normal in size. There is no left ventricular hypertrophy. Left ventricular diastolic parameters are consistent with Grade I diastolic dysfunction (impaired relaxation). Indeterminate filling pressures.  Right Ventricle: The right ventricular size is normal. No increase in right ventricular wall thickness. Right ventricular systolic function is mildly reduced. Tricuspid regurgitation signal is inadequate for assessing PA pressure.  Left Atrium: Left atrial size was mildly  dilated.  Right Atrium: Right atrial size was normal in size.  Pericardium: There is no evidence of pericardial effusion.  Mitral Valve: The mitral valve is abnormal. There is mild thickening of the anterior and posterior mitral valve leaflet(s). Mild mitral valve regurgitation.  Tricuspid Valve: The tricuspid valve is grossly normal. Tricuspid valve regurgitation is trivial.  Aortic Valve: The aortic valve is tricuspid. Aortic valve regurgitation is not visualized.  Pulmonic Valve: The pulmonic valve was normal in structure. Pulmonic valve regurgitation is not visualized.  Aorta: The aortic root and ascending aorta are  structurally normal, with no evidence of dilitation.  Venous: The inferior vena cava is normal in size with greater than 50% respiratory variability, suggesting right atrial pressure of 3 mmHg.  IAS/Shunts: No atrial level shunt detected by color flow Doppler.   LEFT VENTRICLE PLAX 2D LVIDd:         4.00 cm   Diastology LVIDs:         3.30 cm   LV e' medial:    5.22 cm/s LV PW:         1.00 cm   LV E/e' medial:  15.6 LV IVS:        1.00 cm   LV e' lateral:   8.05 cm/s LVOT diam:     2.10 cm   LV E/e' lateral: 10.1 LV SV:         65 LV SV Index:   35 LVOT Area:     3.46 cm   RIGHT VENTRICLE RV S prime:     9.79 cm/s TAPSE (M-mode): 2.0 cm  LEFT ATRIUM             Index        RIGHT ATRIUM           Index LA diam:        3.80 cm 2.02 cm/m   RA Area:     11.00 cm LA Vol (A2C):   35.7 ml 18.95 ml/m  RA Volume:   20.20 ml  10.72 ml/m LA Vol (A4C):   66.0 ml 35.04 ml/m LA Biplane Vol: 51.5 ml 27.34 ml/m AORTIC VALVE LVOT Vmax:   86.20 cm/s LVOT Vmean:  60.600 cm/s LVOT VTI:    0.189 m  AORTA Ao Root diam: 3.00 cm Ao Asc diam:  3.60 cm  MITRAL VALVE MV Area (PHT): 3.95 cm    SHUNTS MV Decel Time: 192 msec    Systemic VTI:  0.19 m MV E velocity: 81.40 cm/s  Systemic Diam: 2.10 cm MV A velocity: 95.50 cm/s MV E/A ratio:  0.85  Zoila Shutter  MD Electronically signed by Zoila Shutter MD Signature Date/Time: 08/09/2022/1:30:57 PM    Final              EKG:  EKG is ordered today.  The ekg ordered today demonstrates NSR 71 bpm with no acute ST/T wave changes.   Recent Labs: 08/12/2022: BUN 12; Creatinine, Ser 1.15; Hemoglobin 14.1; Platelets 337; Potassium 5.1; Sodium 142 11/01/2022: ALT 31 02/23/2023: TSH 2.330  Recent Lipid Panel    Component Value Date/Time   CHOL 116 10/01/2022 0931   TRIG 127 10/01/2022 0931   HDL 39 (L) 10/01/2022 0931   CHOLHDL 3.0 10/01/2022 0931   CHOLHDL 3.4 08/08/2022 2238   VLDL 38 08/08/2022 2238   LDLCALC 54 10/01/2022 0931     Home Medications   Current Meds  Medication Sig   acetaminophen (TYLENOL) 500 MG tablet Take 500 mg by mouth every 6 (six) hours as needed for moderate pain.   acyclovir (ZOVIRAX) 200 MG capsule Take 200 mg by mouth 2 (two) times daily.    ALPRAZolam (XANAX) 0.5 MG tablet    aspirin EC 81 MG tablet Take 1 tablet (81 mg total) by mouth daily. Swallow whole.   buPROPion (WELLBUTRIN XL) 150 MG 24 hr tablet Take 1 tablet every day by oral route.   gabapentin (NEURONTIN) 300 MG capsule Take 300 mg by mouth 3 (three) times daily.   meclizine (ANTIVERT) 25 MG tablet Take 1 tablet (  25 mg total) by mouth 4 (four) times daily as needed for dizziness. (Patient taking differently: Take 25 mg by mouth 3 (three) times daily as needed for dizziness.)   metoprolol tartrate (LOPRESSOR) 25 MG tablet TAKE 1/2 TABLET(12.5 MG) BY MOUTH TWICE DAILY   nitroGLYCERIN (NITROSTAT) 0.4 MG SL tablet Place 1 tablet (0.4 mg total) under the tongue every 5 (five) minutes as needed for chest pain.   pantoprazole (PROTONIX) 40 MG tablet Take 40 mg by mouth daily.   rosuvastatin (CRESTOR) 40 MG tablet TAKE 1 TABLET(40 MG) BY MOUTH DAILY   temazepam (RESTORIL) 30 MG capsule Take 30 mg by mouth at bedtime as needed for sleep.   ticagrelor (BRILINTA) 90 MG TABS tablet TAKE 1 TABLET(90 MG) BY MOUTH  TWICE DAILY   WEGOVY 1 MG/0.5ML SOAJ Inject 1 mg into the skin as directed.     Review of Systems      All other systems reviewed and are otherwise negative except as noted above.  Physical Exam    VS:  BP 108/69   Pulse 71   Ht 5\' 4"  (1.626 m)   Wt 167 lb 6.4 oz (75.9 kg)   SpO2 97%   BMI 28.73 kg/m  , BMI Body mass index is 28.73 kg/m.  Wt Readings from Last 3 Encounters:  06/03/23 167 lb 6.4 oz (75.9 kg)  03/10/23 175 lb (79.4 kg)  02/23/23 175 lb 9.6 oz (79.7 kg)     GEN: Well nourished, well developed, in no acute distress. HEENT: normal. Neck: Supple, no JVD, carotid bruits, or masses. Cardiac: RRR, no murmurs, rubs, or gallops. No clubbing, cyanosis, edema.  Radials/PT 2+ and equal bilaterally.  Respiratory:  Respirations regular and unlabored, clear to auscultation bilaterally. GI: Soft, nontender, nondistended. MS: No deformity or atrophy. Skin: Warm and dry, no rash. Neuro:  Strength and sensation are intact. Psych: Normal affect.  Assessment & Plan    Falls / Orthostatic hypotension -2 falls over the past few months after walking a trail in the morning with her sister.  Does not eat/drink prior to walking.  No syncope, lightheadedness, prodromal symptoms.  Anticipate etiology orthostatic hypotension/vasovagal.  However, given history of CVA will plan for 14-day monitor to rule out arrhythmia as contributory. Education provided on orthostatic precautions: Stay well hydrated, eat regular meals, wear compression socks, make position changes slowly.   Orthostatic VS for the past 24 hrs (Last 3 readings):  BP- Lying Pulse- Lying BP- Sitting Pulse- Sitting BP- Standing at 0 minutes Pulse- Standing at 0 minutes BP- Standing at 3 minutes Pulse- Standing at 3 minutes  06/03/23 0805 104/69 69 108/72 68 91/72 77 92/81 77    CAD s/p STEMI and DES-RCA 07/2022.  Known residual LAD, diagonal, OM disease-  Myoview 02/2023 low risk. Stable with no anginal symptoms. No indication  for ischemic evaluation.  GDMT aspirin, brilinta (DAPT x 1 year), rosuvastatin, metoprolol. Heart healthy diet and regular cardiovascular exercise encouraged.    CVA-s/p 2 small cortical infarct in left parietal lobe, chronic microhemorrhage in the right corona radiata and tiny chronic infarct in left cerebellum.  Carotid Dopplers unremarkable.  Sleep study no sleep apnea.  Event monitor previously ordered but not completed.  Echo 07/2022 normal LVEF 60 to 60%, no RWMA, grade 1 diastolic dysfunction, reduced RV systolic function, mild left atrial enlargement and mild MR.  Hyperlipidemia, LDL goal less than 55- Continue Rosuvastatin 40mg  daily. Denies myalgias.          Disposition: Follow  up  as scheduled  with Peter Swaziland, MD or APP.  Signed, Alver Sorrow, NP 06/03/2023, 10:01 AM [facility]

## 2023-06-03 NOTE — Patient Instructions (Addendum)
Medication Instructions:  Your physician recommends that you continue on your current medications as directed. Please refer to the Current Medication list given to you today.  *If you need a refill on your cardiac medications before your next appointment, please call your pharmacy*  Testing/Procedures: Your physician has recommended that you wear a Zio monitor.   This monitor is a medical device that records the heart's electrical activity. Doctors most often use these monitors to diagnose arrhythmias. Arrhythmias are problems with the speed or rhythm of the heartbeat. The monitor is a small device applied to your chest. You can wear one while you do your normal daily activities. While wearing this monitor if you have any symptoms to push the button and record what you felt. Once you have worn this monitor for the period of time provider prescribed (Usually 14 days), you will return the monitor device in the postage paid box. Once it is returned they will download the data collected and provide [place] with a report which the provider will then review and we will call you with those results. Important tips:  Avoid showering during the first 24 hours of wearing the monitor. Avoid excessive sweating to help maximize wear time. Do not submerge the device, no hot tubs, and no swimming pools. Keep any lotions or oils away from the patch. After 24 hours you may shower with the patch on. Take brief showers with your back facing the shower head.  Do not remove patch once it has been placed because that will interrupt data and decrease adhesive wear time. Push the button when you have any symptoms and write down what you were feeling. Once you have completed wearing your monitor, remove and place into box which has postage paid and place in your outgoing mailbox.  If for some reason you have misplaced your box then call our office and we can provide another box and/or mail it off for you.  Follow-Up: At [facility], you and your health needs are our priority.  As part of our continuing mission to provide you with exceptional heart care, we have created designated Provider Care Teams.  These Care Teams include your primary Cardiologist (physician) and Advanced Practice Providers (APPs -  Physician Assistants and Nurse Practitioners) who all work together to provide you with the care you need, when you need it.  We recommend signing up for the patient portal called "MyChart".  Sign up information is provided on this After Visit Summary.  MyChart is used to connect with patients for Virtual Visits (Telemedicine).  Patients are able to view lab/test results, encounter notes, upcoming appointments, etc.  Non-urgent messages can be sent to your provider as well.   To learn more about what you can do with MyChart, go to [email or web address].    Your next appointment:   Follow up as scheduled   Other Instructions:  Orthostatic Hypotension Blood pressure is a measurement of how strongly, or weakly, your circulating blood is pressing against the walls of your arteries. Orthostatic hypotension is a drop in blood pressure that can happen when you change positions, such as when you go from lying down to standing. Arteries are blood vessels that carry blood from your heart throughout your body. When blood pressure is too low, you may not get enough blood to your brain or to the rest of your organs. Orthostatic hypotension can cause light-headedness, sweating, rapid heartbeat, blurred vision, and fainting. These symptoms require further investigation into the cause. What are  the causes? Orthostatic hypotension can be caused by many things, including: Sudden changes in posture, such as standing up quickly after you have been sitting or lying down. Loss of blood (anemia) or loss of body fluids (dehydration). Heart problems, neurologic problems, or hormone problems. Pregnancy. Aging. The risk for this  condition increases as you get older. Severe infection (sepsis). Certain medicines, such as medicines for high blood pressure or medicines that make the body lose excess fluids (diuretics). What are the signs or symptoms? Symptoms of this condition may include: Weakness, light-headedness, or dizziness. Sweating. Blurred vision. Tiredness (fatigue). Rapid heartbeat. Fainting, in severe cases. How is this diagnosed? This condition is diagnosed based on: Your symptoms and medical history. Your blood pressure measurements. Your health care provider will check your blood pressure when you are: Lying down. Sitting. Standing. A blood pressure reading is recorded as two numbers, such as "120 over 80" (or 120/80). The first ("top") number is called the systolic pressure. It is a measure of the pressure in your arteries as your heart beats. The second ("bottom") number is called the diastolic pressure. It is a measure of the pressure in your arteries when your heart relaxes between beats. Blood pressure is measured in a unit called mmHg. Healthy blood pressure for most adults is 120/80 mmHg. Orthostatic hypotension is defined as a 20 mmHg drop in systolic pressure or a 10 mmHg drop in diastolic pressure within 3 minutes of standing. Other information or tests that may be used to diagnose orthostatic hypotension include: Your other vital signs, such as your heart rate and temperature. Blood tests. An electrocardiogram (ECG) or echocardiogram. A Holter monitor. This is a device you wear that records your heart rhythm continuously, usually for 24-48 hours. Tilt table test. For this test, you will be safely secured to a table that moves you from a lying position to an upright position. Your heart rhythm and blood pressure will be monitored during the test. How is this treated? This condition may be treated by: Changing your diet. This may involve eating more salt (sodium) or drinking more  water. Changing the dosage of certain medicines you are taking that might be lowering your blood pressure. Correcting the underlying reason for the orthostatic hypotension. Wearing compression stockings. Taking medicines to raise your blood pressure. Avoiding actions that trigger symptoms. Follow these instructions at home: Medicines Take over-the-counter and prescription medicines only as told by your health care provider. Follow instructions from your health care provider about changing the dosage of your current medicines, if this applies. Do not stop or adjust any of your medicines on your own. Eating and drinking  Drink enough fluid to keep your urine pale yellow. Eat extra salt only as directed. Do not add extra salt to your diet unless advised by your health care provider. Eat frequent, small meals. Avoid standing up suddenly after eating. General instructions  Get up slowly from lying down or sitting positions. This gives your blood pressure a chance to adjust. Avoid hot showers and excessive heat as directed by your health care provider. Engage in regular physical activity as directed by your health care provider. If you have compression stockings, wear them as told. Keep all follow-up visits. This is important. Contact a health care provider if: You have a fever for more than 2-3 days. You feel more thirsty than usual. You feel dizzy or weak. Get help right away if: You have chest pain. You have a fast or irregular heartbeat. You become sweaty  or feel light-headed. You feel short of breath. You faint. You have any symptoms of a stroke. "BE FAST" is an easy way to remember the main warning signs of a stroke: B - Balance. Signs are dizziness, sudden trouble walking, or loss of balance. E - Eyes. Signs are trouble seeing or a sudden change in vision. F - Face. Signs are sudden weakness or numbness of the face, or the face or eyelid drooping on one side. A - Arms. Signs  are weakness or numbness in an arm. This happens suddenly and usually on one side of the body. S - Speech. Signs are sudden trouble speaking, slurred speech, or trouble understanding what people say. T - Time. Time to call emergency services. Write down what time symptoms started. You have other signs of a stroke, such as: A sudden, severe headache with no known cause. Nausea or vomiting. Seizure. These symptoms may represent a serious problem that is an emergency. Do not wait to see if the symptoms will go away. Get medical help right away. Call your local emergency services (911 in the [place] Do not drive yourself to the hospital. Summary Orthostatic hypotension is a sudden drop in blood pressure. It can cause light-headedness, sweating, rapid heartbeat, blurred vision, and fainting. Orthostatic hypotension can be diagnosed by having your blood pressure taken while lying down, sitting, and then standing. Treatment may involve changing your diet, wearing compression stockings, sitting up slowly, adjusting your medicines, or correcting the underlying reason for the orthostatic hypotension. Get help right away if you have chest pain, a fast or irregular heartbeat, or symptoms of a stroke. This information is not intended to replace advice given to you by your health care provider. Make sure you discuss any questions you have with your health care provider. Document Revised: 02/26/2021 Document Reviewed: 02/26/2021 Elsevier Patient Education  2024 [employer].

## 2023-06-06 ENCOUNTER — Telehealth: Payer: Self-pay | Admitting: Family

## 2023-06-06 NOTE — Telephone Encounter (Signed)
Pt called in stating she has a mammogram scheduled this Friday, however she has a heart monitor on and unsure if she can wear that during her mammogram or not. Please advise.

## 2023-06-06 NOTE — Telephone Encounter (Signed)
Checked with mammo tech, patient will need to be rescheduled if monitor not complete before test.   Returned call to patient and advised her that she needs to wear that heart monitor on 6/21, advised her that the earliest she could have test done is the week of 6/24. She states that is fine and will call to reschedule her mammogram. Patient is appreciative of the call.

## 2023-08-01 ENCOUNTER — Other Ambulatory Visit (HOSPITAL_COMMUNITY): Payer: Self-pay | Admitting: Nephrology

## 2023-08-01 DIAGNOSIS — N178 Other acute kidney failure: Secondary | ICD-10-CM

## 2023-08-05 ENCOUNTER — Telehealth: Payer: Self-pay | Admitting: Cardiology

## 2023-08-05 NOTE — Telephone Encounter (Signed)
Advised patient of information a d she will F/U with PCP

## 2023-08-05 NOTE — Telephone Encounter (Signed)
Returned call to pt about the buzzing in her chest. Left side of chest feels like a sensation, vibrating while she was walking this morning. No SOB, lightheadedness or dizziness. Pt has a headache. Pt states she has these episodes at least one a day. They only last about 5 seconds.

## 2023-08-05 NOTE — Telephone Encounter (Signed)
Patient stated she is getting a "buzzing" sensation in her chest area.

## 2023-08-11 ENCOUNTER — Ambulatory Visit (HOSPITAL_COMMUNITY)
Admission: RE | Admit: 2023-08-11 | Discharge: 2023-08-11 | Disposition: A | Discharge status: Home / Self Care | Payer: Medicare HMO | Source: Ambulatory Visit | Attending: Nephrology | Admitting: Nephrology

## 2023-08-11 ENCOUNTER — Other Ambulatory Visit (HOSPITAL_COMMUNITY)
Admission: RE | Admit: 2023-08-11 | Discharge: 2023-08-11 | Disposition: A | Discharge status: Home / Self Care | Payer: Medicare HMO | Source: Ambulatory Visit | Attending: Nephrology | Admitting: Nephrology

## 2023-08-11 DIAGNOSIS — N178 Other acute kidney failure: Secondary | ICD-10-CM | POA: Insufficient documentation

## 2023-08-11 LAB — URINALYSIS, COMPLETE (UACMP) WITH MICROSCOPIC
Bilirubin Urine: NEGATIVE
Glucose, UA: NEGATIVE mg/dL
Ketones, ur: NEGATIVE mg/dL
Nitrite: NEGATIVE
Protein, ur: 30 mg/dL — AB
Specific Gravity, Urine: 1.008 (ref 1.005–1.030)
pH: 6 (ref 5.0–8.0)

## 2023-08-11 LAB — RENAL FUNCTION PANEL
Albumin: 4.3 g/dL (ref 3.5–5.0)
Anion gap: 12 (ref 5–15)
BUN: 19 mg/dL (ref 8–23)
CO2: 25 mmol/L (ref 22–32)
Calcium: 9.5 mg/dL (ref 8.9–10.3)
Chloride: 101 mmol/L (ref 98–111)
Creatinine, Ser: 1.35 mg/dL — ABNORMAL HIGH (ref 0.44–1.00)
GFR, Estimated: 42 mL/min — ABNORMAL LOW (ref >60)
Glucose, Bld: 95 mg/dL (ref 70–99)
Phosphorus: 3.4 mg/dL (ref 2.5–4.6)
Potassium: 3.7 mmol/L (ref 3.5–5.1)
Sodium: 138 mmol/L (ref 135–145)

## 2023-08-11 LAB — PROTEIN / CREATININE RATIO, URINE
Creatinine, Urine: 56 mg/dL
Protein Creatinine Ratio: 1.04 mg/mg{Cre} — ABNORMAL HIGH (ref 0.00–0.15)
Total Protein, Urine: 58 mg/dL

## 2023-08-11 LAB — URIC ACID: Uric Acid, Serum: 5.8 mg/dL (ref 2.5–7.1)

## 2023-08-11 LAB — CK: Total CK: 125 U/L (ref 38–234)

## 2023-08-11 LAB — MAGNESIUM: Magnesium: 2.6 mg/dL — ABNORMAL HIGH (ref 1.7–2.4)

## 2023-08-12 LAB — KAPPA/LAMBDA LIGHT CHAINS
Kappa free light chain: 25 mg/L — ABNORMAL HIGH (ref 3.3–19.4)
Kappa, lambda light chain ratio: 2.14 — ABNORMAL HIGH (ref 0.26–1.65)
Lambda free light chains: 11.7 mg/L (ref 5.7–26.3)

## 2023-08-13 LAB — C4 COMPLEMENT: Complement C4, Body Fluid: 23 mg/dL (ref 12–38)

## 2023-08-13 LAB — PARATHYROID HORMONE, INTACT (NO CA): PTH: 16 pg/mL (ref 15–65)

## 2023-08-13 LAB — C3 COMPLEMENT: C3 Complement: 152 mg/dL (ref 82–167)

## 2023-08-14 LAB — PROTEIN ELECTROPHORESIS, SERUM
A/G Ratio: 1.4 (ref 0.7–1.7)
Albumin ELP: 4.2 g/dL (ref 2.9–4.4)
Alpha-1-Globulin: 0.2 g/dL (ref 0.0–0.4)
Alpha-2-Globulin: 0.8 g/dL (ref 0.4–1.0)
Beta Globulin: 1 g/dL (ref 0.7–1.3)
Gamma Globulin: 1 g/dL (ref 0.4–1.8)
Globulin, Total: 3.1 g/dL (ref 2.2–3.9)
Total Protein ELP: 7.3 g/dL (ref 6.0–8.5)

## 2023-08-15 LAB — ANCA PROFILE
Anti-MPO Antibodies: <0.2 U (ref 0.0–0.9)
Anti-PR3 Antibodies: <0.2 U (ref 0.0–0.9)
Atypical P-ANCA titer: <1:20 {titer}
C-ANCA: <1:20 {titer}
P-ANCA: <1:20 {titer}

## 2023-08-16 LAB — FANA STAINING PATTERNS: Speckled Pattern: 24529 — ABNORMAL HIGH

## 2023-08-16 LAB — ANTINUCLEAR ANTIBODIES, IFA: ANA Ab, IFA: POSITIVE — AB

## 2023-08-18 NOTE — Progress Notes (Signed)
Cardiology Office Note:    Date:  08/22/2023   ID:  [name], DOB 11/18/1951, MRN [id number]  PCP:  Katherine Payor, FNP   [facility] Providers Cardiologist:  Katherine Enck [place], MD     Referring MD: Katherine Payor, FNP   Chief Complaint  Patient presents with   Coronary Artery Disease    History of Present Illness:    Katherine Short is a 72 y.o. female with a history of CAD s/p STEMI, DES-RCA in 07/2022, CVA, hypertension, hyperlipidemia, GERD, and anxiety who presents for follow-up related to CAD.  Myoview in March 2011 was low risk.  Echocardiogram at the time was normal.  She presented to the ED on 08/08/2022 with acute onset chest pain. EKG showed inferior ST elevation with reciprocal changes in anterior leads.  She was transferred to [facility] for emergent cardiac catheterization which revealed acute occlusion of the PL branch of RCA s/p DES, otherwise diffuse moderate disease and small caliber LAD and diagonal vessels, EF 55-65%. Echocardiogram showed EF 60 to 65%,  normal LV function, no RWMA, G1 DD, reduced RV systolic function, mild LA enlargement, and mild mitral valve regurgitation.   She was started on aspirin, Brilinta, low-dose metoprolol, and Crestor (previously on simvastatin). Home Benicar was held at discharge due to borderline BP.  She presented to the ED again on 08/12/2022 with reports of left arm tingling, left hip and left leg tingling and diaphoresis. MRI of the brain showed 2 small cortical infarcts in the left parietal lobe, chronic microhemorrhage in the right corona radiata, and tiny chronic infarct in the left cerebellum. MRA, carotid ultrasound, and Zio patch were recommended.  She was discharged home in stable condition. Carotid dopplers done were OK. Event monitor ordered but never completed. Seen by Neuro and sleep study recommended. This showed no significant sleep apnea.   When I saw  her in Feb her major complaint was of a lack of energy. Insomnia.  She is walking 3 miles a day and doing some work outside. Noted some chest discomfort at times across her upper chest. Has not needed to take sl Ntg. She had a Myoview study that showed normal perfusion and EF.   She was seen in our office in June. Noted a couple of falls while walking. Event monitor showed no significant arrhythmia to explain falls.   She reports she is doing well. Minor vibration sense in her chest. Still feels tired and has some lightheadedness. No real pain. She is walking 3 miles a day. She was seen by Nephrology- Dr Katherine Short. Started on losartan. She did have screening home ABIs done showing 0.84 on the left and 0.87 on the right. No claudication symptoms. Screening spirometry normal.     Past Medical History:  Diagnosis Date   Anxiety    Chest pain 03/19/2010   nuclear study was negative   Depression    Dyslipidemia    GERD (gastroesophageal reflux disease)    Headache(784.0)    Hypertension    Obstructive sleep apnea 08/24/2022   STEMI (ST elevation myocardial infarction) (HCC)    Stroke [facility])     Past Surgical History:  Procedure Laterality Date   BREAST LUMPECTOMY WITH RADIOACTIVE SEED LOCALIZATION Left 07/07/2017   Procedure: LEFT BREAST LUMPECTOMY WITH RADIOACTIVE SEED LOCALIZATION;  Surgeon: Griselda Miner, MD;  Location: [facility] OR;  Service: General;  Laterality: Left;   BREAST LUMPECTOMY WITH RADIOACTIVE SEED LOCALIZATION Left 07/18/2017   Procedure: LEFT BREAST LUMPECTOMY WITH RADIOACTIVE  SEED LOCALIZATION;  Surgeon: Griselda Miner, MD;  Location: [facility];  Service: General;  Laterality: Left;   CATARACT EXTRACTION W/PHACO  11/22/2011   Procedure: CATARACT EXTRACTION PHACO AND INTRAOCULAR LENS PLACEMENT (IOC);  Surgeon: Gemma Short;  Location: [facility] ORS;  Service: Ophthalmology;  Laterality: Left;  CDE:7.76   CATARACT EXTRACTION W/PHACO  12/02/2011   Procedure: CATARACT EXTRACTION PHACO AND INTRAOCULAR LENS PLACEMENT (IOC);  Surgeon: Gemma Short;   Location: [facility] ORS;  Service: Ophthalmology;  Laterality: Right;  CDE=11.92   COLONOSCOPY     CORONARY/GRAFT ACUTE MI REVASCULARIZATION N/A 08/08/2022   Procedure: Coronary/Graft Acute MI Revascularization;  Surgeon: Short, [place] Huberty M, MD;  Location: [facility] CV LAB;  Service: Cardiovascular;  Laterality: N/A;   LEFT HEART CATH AND CORONARY ANGIOGRAPHY N/A 08/08/2022   Procedure: LEFT HEART CATH AND CORONARY ANGIOGRAPHY;  Surgeon: Short, Jannae Fagerstrom M, MD;  Location: [facility] INVASIVE CV LAB;  Service: Cardiovascular;  Laterality: N/A;   TUBAL LIGATION      Current Medications: Current Meds  Medication Sig   acetaminophen (TYLENOL) 500 MG tablet Take 500 mg by mouth every 6 (six) hours as needed for moderate pain.   aspirin EC 81 MG tablet Take 1 tablet (81 mg total) by mouth daily. Swallow whole.   buPROPion (WELLBUTRIN XL) 150 MG 24 hr tablet Take 1 tablet every day by oral route.   gabapentin (NEURONTIN) 300 MG capsule Take 300 mg by mouth 3 (three) times daily.   losartan (COZAAR) 25 MG tablet Take 25 mg by mouth daily.   pantoprazole (PROTONIX) 40 MG tablet Take 40 mg by mouth daily.   rosuvastatin (CRESTOR) 40 MG tablet TAKE 1 TABLET(40 MG) BY MOUTH DAILY   temazepam (RESTORIL) 30 MG capsule Take 30 mg by mouth at bedtime as needed for sleep.   WEGOVY 1 MG/0.5ML SOAJ Inject 1 mg into the skin once a week.   [DISCONTINUED] metoprolol tartrate (LOPRESSOR) 25 MG tablet TAKE 1/2 TABLET(12.5 MG) BY MOUTH TWICE DAILY   [DISCONTINUED] ticagrelor (BRILINTA) 90 MG TABS tablet TAKE 1 TABLET(90 MG) BY MOUTH TWICE DAILY     Allergies:   Penicillins   Social History   Socioeconomic History   Marital status: Married    Spouse name: Not on file   Number of children: Not on file   Years of education: Not on file   Highest education level: Not on file  Occupational History   Not on file  Tobacco Use   Smoking status: Never   Smokeless tobacco: Never  Vaping Use   Vaping status: Never Used  Substance  and Sexual Activity   Alcohol use: No   Drug use: No   Sexual activity: Not on file  Other Topics Concern   Not on file  Social History Narrative   Caffiene sprite occasional. (Or Sprite zero).    Education HS grad   Retired:  Engineer, petroleum.    Social Determinants of Health   Financial Resource Strain: Not on file  Food Insecurity: Not on file  Transportation Needs: Not on file  Physical Activity: Not on file  Stress: Not on file  Social Connections: Not on file     Family History: The patient's family history includes Cancer (age of onset: 51) in her mother; Diabetes in her father; Hypertension in her brother; Thyroid disease in her sister, sister, and sister.  ROS:   Please see the history of present illness.     All other systems reviewed and are  negative.  EKGs/Labs/Other Studies Reviewed:    The following studies were reviewed today: Echo 08/09/22: IMPRESSIONS     1. Left ventricular ejection fraction, by estimation, is 60 to 65%. The  left ventricle has normal function. The left ventricle has no regional  wall motion abnormalities. Left ventricular diastolic parameters are  consistent with Grade I diastolic  dysfunction (impaired relaxation).   2. Right ventricular systolic function is mildly reduced. The right  ventricular size is normal. Tricuspid regurgitation signal is inadequate  for assessing PA pressure.   3. Left atrial size was mildly dilated.   4. The mitral valve is abnormal. Mild mitral valve regurgitation.   5. The aortic valve is tricuspid. Aortic valve regurgitation is not  visualized.   6. The inferior vena cava is normal in size with greater than 50%  respiratory variability, suggesting right atrial pressure of 3 mmHg.   Cardiac cath/PCI 08/09/22: Procedures  Coronary/Graft Acute MI Revascularization  LEFT HEART CATH AND CORONARY ANGIOGRAPHY   Conclusion      RPAV lesion is 100% stenosed.   Mid RCA lesion is 30% stenosed.   Mid LAD  lesion is 60% stenosed.   2nd Diag lesion is 70% stenosed.   1st Diag lesion is 50% stenosed.   1st Mrg lesion is 50% stenosed.   A drug-eluting stent was successfully placed using a SYNERGY XD 2.50X20.   Post intervention, there is a 0% residual stenosis.   The left ventricular systolic function is normal.   LV end diastolic pressure is mildly elevated.   The left ventricular ejection fraction is 55-65% by visual estimate.   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  Good LV function with inferobasal HK Mildly elevated LVEDP 20 mm Hg Successful PCI of the PL branch of the RCA with DES x 1.    Plan: DAPT with ASA and Brilinta for one year. May be a candidate for fast track discharge depending on clinical course.     Diagnostic Dominance: Right  Intervention   Myoview 03/10/23: Study Highlights      The study is normal. The study is low risk.   No ST deviation was noted.   LV perfusion is normal. There is no evidence of ischemia. There is no evidence of infarction.   Left ventricular function is normal. End diastolic cavity size is normal. End systolic cavity size is normal.   Prior study available for comparison from 03/19/2010.   Normal stress nuclear study with no ischemia or infarction; gated EF 69.  Event monitor 06/14/23: Study Highlights      Normal sinus rhythm with first degree AV block   Rare short runs of PAT. longest 12 seconds   Rare PVCs with bigeminy and trigeminy     Patch Wear Time:  4 days and 8 hours (2024-06-07T08:38:31-0400 to 2024-06-11T16:56:41-0400)   Patient had a min HR of 51 bpm, max HR of 171 bpm, and avg HR of 72 bpm. Predominant underlying rhythm was Sinus Rhythm. First Degree AV Block was present. Bundle Branch Block/IVCD was present. 4 Supraventricular Tachycardia runs occurred, the run with  the fastest interval lasting 5 beats with a max rate of 171 bpm, the longest  lasting 12.3 secs with an avg rate of 108 bpm. Isolated SVEs were rare (<1.0%), SVE Couplets were rare (<1.0%), and SVE Triplets were rare (<1.0%). Isolated VEs were rare  (<1.0%), and no VE Couplets or VE Triplets were  present. Ventricular Bigeminy and Trigeminy were present.    EKG Interpretation Date/Time:  Monday August 22 2023 07:48:51 EDT Ventricular Rate:  63 PR Interval:  172 QRS Duration:  82 QT Interval:  408 QTC Calculation: 417 R Axis:   -21  Text Interpretation: Normal sinus rhythm Low voltage QRS Inferior infarct (cited on or before 10-Aug-2022) When compared with ECG of 12-Aug-2022 20:35, Left anterior fascicular block is no longer Present Criteria for Anterior infarct are no longer Present Criteria for Anterolateral infarct are no longer Present Confirmed by Short, Essynce Munsch [phone]) on 08/22/2023 7:52:04 AM    Recent Labs: 11/01/2022: ALT 31 02/23/2023: TSH 2.330 08/11/2023: BUN 19; Creatinine, Ser 1.35; Magnesium 2.6; Potassium 3.7; Sodium 138  Recent Lipid Panel    Component Value Date/Time   CHOL 116 10/01/2022 0931   TRIG 127 10/01/2022 0931   HDL 39 (L) 10/01/2022 0931   CHOLHDL 3.0 10/01/2022 0931   CHOLHDL 3.4 08/08/2022 2238   VLDL 38 08/08/2022 2238   LDLCALC 54 10/01/2022 0931   Dated 12/09/22: A1c 6%. Cholesterol 112, triglycerides 136, HDL 37, LDL 51, CMET and CBC normal.  Dated 06/16/23: cholesterol 120, triglycerides 122, HDL 48, LDL 50. A1c 6.2%. creatinine 1.35. GFR 43. Otherwise CMET and CBC normal.   Risk Assessment/Calculations:                Physical Exam:    VS:  BP 102/64 (BP Location: Left Arm, Patient Position: Sitting, Cuff Size: Normal)   Pulse 63   Ht 5\' 4"  (1.626 m)   Wt 159 lb 12.8 oz (72.5 kg)   BMI 27.43 kg/m     Wt Readings from Last 3 Encounters:  08/22/23 159 lb 12.8 oz (72.5 kg)  06/03/23 167 lb 6.4 oz (75.9 kg)  03/10/23 175 lb (79.4 kg)     GEN:  Well nourished, well developed in no acute distress HEENT:  Normal NECK: No JVD; No carotid bruits LYMPHATICS: No lymphadenopathy CARDIAC: RRR, no murmurs, rubs, gallops RESPIRATORY:  Clear to auscultation without rales, wheezing or rhonchi  ABDOMEN: Soft, non-tender, non-distended MUSCULOSKELETAL:  No edema; No deformity  SKIN: Warm and dry NEUROLOGIC:  Alert and oriented x 3 PSYCHIATRIC:  Normal affect   ASSESSMENT:    1. Coronary artery disease of native artery of native heart with stable angina pectoris (HCC)   2. Hypertension, unspecified type   3. Hyperlipidemia LDL goal <70   4. History of CVA (cerebrovascular accident)     PLAN:    In order of problems listed above:  1. CAD: s/p STEMI, DES-RCA in 07/2022. Stable class 1 angina. She has residual LAD, diagonal and OM disease on cath. Myoview study showed no ischemia and normal EF.  Continue aspirin and Crestor. May discontinue Brilinta at this point. Also can stop Metoprolol.   2. CVA: S/p 2 small cortical infarcts in the left parietal lobe, chronic microhemorrhage in the right corona radiata, and tiny chronic infarct in the left cerebellum. Echo EF 60 to 65%, normal LV function, no RWMA, G1 DD, reduced RV systolic function, mild LA enlargement, and mild mitral valve regurgitation.  Dopplers were unremarkable.  Event monitor showed no Afib.    3. Hypertension: BP well controlled. Continue losartan. May stop metoprolol.    4. Hyperlipidemia:  LDL was 50. Continue aspirin, Crestor.         Medication Adjustments/Labs and Tests Ordered: Current medicines are reviewed at length with the patient today.  Concerns regarding medicines are outlined above.  Orders Placed This Encounter  Procedures   EKG 12-Lead   No orders of the defined types were placed in this encounter.   Patient Instructions  Medication Instructions:  Your physician has recommended you make the following change in your medication:  - STOP taking Metoprolol - STOP taking Brilinta  *If you need a refill on your  cardiac medications before your next appointment, please call your pharmacy*   Follow-Up: At [facility], you and your health needs are our priority.  As part of our continuing mission to provide you with exceptional heart care, we have created designated Provider Care Teams.  These Care Teams include your primary Cardiologist (physician) and Advanced Practice Providers (APPs -  Physician Assistants and Nurse Practitioners) who all work together to provide you with the care you need, when you need it.  We recommend signing up for the patient portal called "MyChart".  Sign up information is provided on this After Visit Summary.  MyChart is used to connect with patients for Virtual Visits (Telemedicine).  Patients are able to view lab/test results, encounter notes, upcoming appointments, etc.  Non-urgent messages can be sent to your provider as well.   To learn more about what you can do with MyChart, go to [email or web address].    Your next appointment:   6 month(s): CALL IN NOVEMBER TO SCHEDULE NEXT APPOINTMENT   Provider:   Jude Linck [place], MD        Signed, Katherine Reppucci [place], MD  08/22/2023 8:11 AM    [facility]

## 2023-08-22 ENCOUNTER — Encounter: Payer: Self-pay | Admitting: Cardiology

## 2023-08-22 ENCOUNTER — Ambulatory Visit: Payer: Medicare HMO | Attending: Cardiology | Admitting: Cardiology

## 2023-08-22 VITALS — BP 102/64 | HR 63 | Ht 64.0 in | Wt 159.8 lb

## 2023-08-22 DIAGNOSIS — Z8673 Personal history of transient ischemic attack (TIA), and cerebral infarction without residual deficits: Secondary | ICD-10-CM | POA: Diagnosis not present

## 2023-08-22 DIAGNOSIS — E785 Hyperlipidemia, unspecified: Secondary | ICD-10-CM

## 2023-08-22 DIAGNOSIS — I10 Essential (primary) hypertension: Secondary | ICD-10-CM

## 2023-08-22 DIAGNOSIS — I25118 Atherosclerotic heart disease of native coronary artery with other forms of angina pectoris: Secondary | ICD-10-CM | POA: Diagnosis not present

## 2023-08-22 NOTE — Patient Instructions (Signed)
Medication Instructions:  Your physician has recommended you make the following change in your medication:  - STOP taking Metoprolol - STOP taking Brilinta  *If you need a refill on your cardiac medications before your next appointment, please call your pharmacy*   Follow-Up: At [facility], you and your health needs are our priority.  As part of our continuing mission to provide you with exceptional heart care, we have created designated Provider Care Teams.  These Care Teams include your primary Cardiologist (physician) and Advanced Practice Providers (APPs -  Physician Assistants and Nurse Practitioners) who all work together to provide you with the care you need, when you need it.  We recommend signing up for the patient portal called "MyChart".  Sign up information is provided on this After Visit Summary.  MyChart is used to connect with patients for Virtual Visits (Telemedicine).  Patients are able to view lab/test results, encounter notes, upcoming appointments, etc.  Non-urgent messages can be sent to your provider as well.   To learn more about what you can do with MyChart, go to [email or web address].    Your next appointment:   6 month(s): CALL IN NOVEMBER TO SCHEDULE NEXT APPOINTMENT   Provider:   Peter [place], MD

## 2023-08-23 ENCOUNTER — Other Ambulatory Visit: Payer: Self-pay | Admitting: Nurse Practitioner

## 2023-09-05 ENCOUNTER — Telehealth: Payer: Self-pay | Admitting: Cardiology

## 2023-09-05 MED ORDER — NITROGLYCERIN 0.4 MG SL SUBL: 0.4000 mg | SUBLINGUAL_TABLET | SUBLINGUAL | 3 refills | Status: DC | PRN

## 2023-09-05 NOTE — Telephone Encounter (Signed)
  Patient is asking if she still needs to carry Nitroglycerin with her at all times since he took her off some of her other medications. Please advise.

## 2023-09-05 NOTE — Telephone Encounter (Signed)
Returned call to pt. Pt saw Dr. Swaziland on the 26th of August. She says she has not needed her nitroglycerin in the year she has had it. She would like to know since it has been a year should she get a new script and still carry it with her? Her heart attack was last year. Please advise.

## 2023-09-05 NOTE — Telephone Encounter (Signed)
Called patient. Advised she should have NTG on hand -- refilled to [facility]

## 2023-09-05 NOTE — Telephone Encounter (Signed)
[place], Peter M, MD  Cv Div Nl Triage; Charna Elizabeth, LPNJust now (11:38 AM)    Yes I think it is a good idea to have on hand just in case. Should get a new bottle yearly  Peter [place] MD, [facility]

## 2023-09-08 ENCOUNTER — Other Ambulatory Visit (HOSPITAL_COMMUNITY)
Admission: RE | Admit: 2023-09-08 | Discharge: 2023-09-08 | Disposition: A | Discharge status: Home / Self Care | Payer: Medicare HMO | Source: Ambulatory Visit | Attending: Nephrology | Admitting: Nephrology

## 2023-09-08 DIAGNOSIS — N1832 Chronic kidney disease, stage 3b: Secondary | ICD-10-CM | POA: Diagnosis present

## 2023-09-08 LAB — RENAL FUNCTION PANEL
Albumin: 4.2 g/dL (ref 3.5–5.0)
Anion gap: 13 (ref 5–15)
BUN: 12 mg/dL (ref 8–23)
CO2: 29 mmol/L (ref 22–32)
Calcium: 9.5 mg/dL (ref 8.9–10.3)
Chloride: 100 mmol/L (ref 98–111)
Creatinine, Ser: 0.96 mg/dL (ref 0.44–1.00)
GFR, Estimated: >60 mL/min (ref >60)
Glucose, Bld: 106 mg/dL — ABNORMAL HIGH (ref 70–99)
Phosphorus: 4 mg/dL (ref 2.5–4.6)
Potassium: 3.7 mmol/L (ref 3.5–5.1)
Sodium: 142 mmol/L (ref 135–145)

## 2023-09-08 LAB — PROTEIN / CREATININE RATIO, URINE
Creatinine, Urine: 345 mg/dL
Protein Creatinine Ratio: 0.26 mg/mg{creat} — ABNORMAL HIGH (ref 0.00–0.15)
Total Protein, Urine: 90 mg/dL

## 2023-10-11 ENCOUNTER — Other Ambulatory Visit (HOSPITAL_BASED_OUTPATIENT_CLINIC_OR_DEPARTMENT_OTHER): Payer: Self-pay

## 2024-01-04 ENCOUNTER — Ambulatory Visit (INDEPENDENT_AMBULATORY_CARE_PROVIDER_SITE_OTHER): Payer: Medicare HMO

## 2024-01-04 ENCOUNTER — Encounter: Payer: Self-pay | Admitting: Podiatry

## 2024-01-04 ENCOUNTER — Ambulatory Visit (INDEPENDENT_AMBULATORY_CARE_PROVIDER_SITE_OTHER): Payer: Medicare HMO | Admitting: Podiatry

## 2024-01-04 DIAGNOSIS — R202 Paresthesia of skin: Secondary | ICD-10-CM

## 2024-01-04 DIAGNOSIS — I999 Unspecified disorder of circulatory system: Secondary | ICD-10-CM | POA: Diagnosis not present

## 2024-01-04 DIAGNOSIS — R200 Anesthesia of skin: Secondary | ICD-10-CM | POA: Diagnosis not present

## 2024-01-04 DIAGNOSIS — D169 Benign neoplasm of bone and articular cartilage, unspecified: Secondary | ICD-10-CM | POA: Diagnosis not present

## 2024-01-04 NOTE — Progress Notes (Signed)
 Patient states she subjective:   Patient ID: Katherine Short, female   DOB: 73 y.o.   MRN: [id number]   HPI The long-term history of coldness in her feet and numbness and tingling pain.  Points to the left fifth digit states that it has been getting very sore and she does not know whether or not she may have done something and patient does not smoke likes to be active   Review of Systems  All other systems reviewed and are negative.       Objective:  Physical Exam Vitals and nursing note reviewed.  Constitutional:      Appearance: She is well-developed.  Pulmonary:     Effort: Pulmonary effort is normal.  Musculoskeletal:        General: Normal range of motion.  Skin:    General: Skin is warm.  Neurological:     Mental Status: She is alert.     Neurovascular status indicates mild diminishment of circulatory status no indications of claudication symptomatology and pain in the left fifth digit that occurred over the last few weeks with upon analysis showing some redness in the toe and no ulceration or preulcerative condition.     Assessment:  I do think this is cold exposure which is occurred left over right with probability for mild vascular disease along with possibility probability for Raynaud's syndrome     Plan:  H&P reviewed at great length and at this point I explained the absolute importance of not exposing her feet to cold and we discussed what to watch out for.  I did apply a cushioning to the left fifth digit and I want her to use warm socks and not go barefoot at all or even in socks by themselves  X-rays indicate possibility for spur left fifth digit but no other pathology noted with no other indications of pathology

## 2024-02-18 NOTE — Progress Notes (Unsigned)
 Cardiology Office Note:    Date:  02/24/2024   ID:  Katherine Short, DOB [birth year], MRN [id number]  PCP:  Leone Payor, FNP   [facility] Providers Cardiologist:  Khalessi Blough [place], MD     Referring MD: Leone Payor, FNP   Chief Complaint  Patient presents with   Follow-up    6 months.   Edema    History of Present Illness:    Katherine Short is a 73 y.o. female with a history of CAD s/p STEMI, DES-RCA in 07/2022, CVA, hypertension, hyperlipidemia, GERD, and anxiety who presents for follow-up related to CAD.  Myoview in March 2011 was low risk.  Echocardiogram at the time was normal.  She presented to the ED on 08/08/2022 with acute onset chest pain. EKG showed inferior ST elevation with reciprocal changes in anterior leads.  She was transferred to [facility] for emergent cardiac catheterization which revealed acute occlusion of the PL branch of RCA s/p DES, otherwise diffuse moderate disease and small caliber LAD and diagonal vessels, EF 55-65%. Echocardiogram showed EF 60 to 65%,  normal LV function, no RWMA, G1 DD, reduced RV systolic function, mild LA enlargement, and mild mitral valve regurgitation.   She was started on aspirin, Brilinta, low-dose metoprolol, and Crestor (previously on simvastatin). Home Benicar was held at discharge due to borderline BP.  She presented to the ED again on 08/12/2022 with reports of left arm tingling, left hip and left leg tingling and diaphoresis. MRI of the brain showed 2 small cortical infarcts in the left parietal lobe, chronic microhemorrhage in the right corona radiata, and tiny chronic infarct in the left cerebellum. MRA, carotid ultrasound, and Zio patch were recommended.  She was discharged home in stable condition. Carotid dopplers done were OK. Event monitor ordered but never completed. Seen by Neuro and sleep study recommended. This showed no significant sleep apnea.   When I saw  her in Feb 2024 her major complaint was of a lack of  energy. Insomnia. She is walking 3 miles a day and doing some work outside. Noted some chest discomfort at times across her upper chest. Has not needed to take sl Ntg. She had a Myoview study that showed normal perfusion and EF.    On follow up today she is doing well. No chest pain or SOB. Walks daily for 3 miles. Minimal claudication if walks up hills. Has continued to lose weight on Wegovy. Down 13 more lbs.     Past Medical History:  Diagnosis Date   Anxiety    Chest pain 03/19/2010   nuclear study was negative   Depression    Dyslipidemia    GERD (gastroesophageal reflux disease)    Headache(784.0)    Hypertension    Obstructive sleep apnea 08/24/2022   STEMI (ST elevation myocardial infarction) (HCC)    Stroke [facility])     Past Surgical History:  Procedure Laterality Date   BREAST LUMPECTOMY WITH RADIOACTIVE SEED LOCALIZATION Left 07/07/2017   Procedure: LEFT BREAST LUMPECTOMY WITH RADIOACTIVE SEED LOCALIZATION;  Surgeon: Griselda Miner, MD;  [facility];  Service: General;  Laterality: Left;   BREAST LUMPECTOMY WITH RADIOACTIVE SEED LOCALIZATION Left 07/18/2017   Procedure: LEFT BREAST LUMPECTOMY WITH RADIOACTIVE SEED LOCALIZATION;  Surgeon: Griselda Miner, MD;  Location: [facility];  Service: General;  Laterality: Left;   CATARACT EXTRACTION W/PHACO  11/22/2011   Procedure: CATARACT EXTRACTION PHACO AND INTRAOCULAR LENS PLACEMENT (IOC);  Surgeon: Gemma Payor;  Location: [facility]  ORS;  Service: Ophthalmology;  Laterality: Left;  CDE:7.76   CATARACT EXTRACTION W/PHACO  12/02/2011   Procedure: CATARACT EXTRACTION PHACO AND INTRAOCULAR LENS PLACEMENT (IOC);  Surgeon: Gemma Payor;  Location: [facility] ORS;  Service: Ophthalmology;  Laterality: Right;  CDE=11.92   COLONOSCOPY     CORONARY/GRAFT ACUTE MI REVASCULARIZATION N/A 08/08/2022   Procedure: Coronary/Graft Acute MI Revascularization;  Surgeon: [place], Tracina Beaumont M, MD;  Location: [facility] INVASIVE CV LAB;  Service: Cardiovascular;   Laterality: N/A;   LEFT HEART CATH AND CORONARY ANGIOGRAPHY N/A 08/08/2022   Procedure: LEFT HEART CATH AND CORONARY ANGIOGRAPHY;  Surgeon: [place], Tisha Cline M, MD;  Location: [facility] INVASIVE CV LAB;  Service: Cardiovascular;  Laterality: N/A;   TUBAL LIGATION      Current Medications: Current Meds  Medication Sig   acetaminophen (TYLENOL) 500 MG tablet Take 500 mg by mouth every 6 (six) hours as needed for moderate pain.   ASPIRIN LOW DOSE 81 MG tablet TAKE 1 TABLET(81 MG) BY MOUTH DAILY. SWALLOW WHOLE   buPROPion (WELLBUTRIN XL) 300 MG 24 hr tablet Take 300 mg by mouth daily.   gabapentin (NEURONTIN) 300 MG capsule Take 300 mg by mouth 3 (three) times daily.   losartan (COZAAR) 25 MG tablet Take 25 mg by mouth daily.   pantoprazole (PROTONIX) 40 MG tablet Take 40 mg by mouth daily.   rosuvastatin (CRESTOR) 40 MG tablet TAKE 1 TABLET(40 MG) BY MOUTH DAILY   temazepam (RESTORIL) 30 MG capsule Take 30 mg by mouth at bedtime as needed for sleep.   WEGOVY 1 MG/0.5ML SOAJ Inject 1 mg into the skin once a week.   [DISCONTINUED] buPROPion (WELLBUTRIN XL) 150 MG 24 hr tablet Take 1 tablet every day by oral route.   [DISCONTINUED] nitroGLYCERIN (NITROSTAT) 0.4 MG SL tablet Place 1 tablet (0.4 mg total) under the tongue every 5 (five) minutes as needed for chest pain.     Allergies:   Penicillins   Social History   Socioeconomic History   Marital status: Married    Spouse name: Not on file   Number of children: Not on file   Years of education: Not on file   Highest education level: Not on file  Occupational History   Not on file  Tobacco Use   Smoking status: Never   Smokeless tobacco: Never  Vaping Use   Vaping status: Never Used  Substance and Sexual Activity   Alcohol use: No   Drug use: No   Sexual activity: Not on file  Other Topics Concern   Not on file  Social History Narrative   Caffiene sprite occasional. (Or Sprite zero).    Education HS grad   Retired:  Engineer, petroleum.     Social Drivers of Corporate investment banker Strain: Not on file  Food Insecurity: Not on file  Transportation Needs: Not on file  Physical Activity: Not on file  Stress: Not on file  Social Connections: Not on file     Family History: The patient's family history includes Cancer (age of onset: 41) in her mother; Diabetes in her father; Hypertension in her brother; Thyroid disease in her sister, sister, and sister.  ROS:   Please see the history of present illness.     All other systems reviewed and are negative.  EKGs/Labs/Other Studies Reviewed:    The following studies were reviewed today: Echo 08/09/22: IMPRESSIONS     1. Left ventricular ejection fraction, by estimation, is 60 to 65%. The  left ventricle  has normal function. The left ventricle has no regional  wall motion abnormalities. Left ventricular diastolic parameters are  consistent with Grade I diastolic  dysfunction (impaired relaxation).   2. Right ventricular systolic function is mildly reduced. The right  ventricular size is normal. Tricuspid regurgitation signal is inadequate  for assessing PA pressure.   3. Left atrial size was mildly dilated.   4. The mitral valve is abnormal. Mild mitral valve regurgitation.   5. The aortic valve is tricuspid. Aortic valve regurgitation is not  visualized.   6. The inferior vena cava is normal in size with greater than 50%  respiratory variability, suggesting right atrial pressure of 3 mmHg.   Cardiac cath/PCI 08/09/22: Procedures  Coronary/Graft Acute MI Revascularization  LEFT HEART CATH AND CORONARY ANGIOGRAPHY   Conclusion      RPAV lesion is 100% stenosed.   Mid RCA lesion is 30% stenosed.   Mid LAD lesion is 60% stenosed.   2nd Diag lesion is 70% stenosed.   1st Diag lesion is 50% stenosed.   1st Mrg lesion is 50% stenosed.   A drug-eluting stent was successfully placed using a SYNERGY XD 2.50X20.   Post intervention, there is a 0% residual  stenosis.   The left ventricular systolic function is normal.   LV end diastolic pressure is mildly elevated.   The left ventricular ejection fraction is 55-65% by visual estimate.   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  Good LV function with inferobasal HK Mildly elevated LVEDP 20 mm Hg Successful PCI of the PL branch of the RCA with DES x 1.    Plan: DAPT with ASA and Brilinta for one year. May be a candidate for fast track discharge depending on clinical course.     Diagnostic Dominance: Right  Intervention   Myoview 03/10/23: Study Highlights      The study is normal. The study is low risk.   No ST deviation was noted.   LV perfusion is normal. There is no evidence of ischemia. There is no evidence of infarction.   Left ventricular function is normal. End diastolic cavity size is normal. End systolic cavity size is normal.   Prior study available for comparison from 03/19/2010.   Normal stress nuclear study with no ischemia or infarction; gated EF 69.  Event monitor 06/14/23: Study Highlights      Normal sinus rhythm with first degree AV block   Rare short runs of PAT. longest 12 seconds   Rare PVCs with bigeminy and trigeminy     Patch Wear Time:  4 days and 8 hours (2024-06-07T08:38:31-0400 to 2024-06-11T16:56:41-0400)   Patient had a min HR of 51 bpm, max HR of 171 bpm, and avg HR of 72 bpm. Predominant underlying rhythm was Sinus Rhythm. First Degree AV Block was present. Bundle Branch Block/IVCD was present. 4 Supraventricular Tachycardia runs occurred, the run with  the fastest interval lasting 5 beats with a max rate of 171 bpm, the longest lasting 12.3 secs with an avg rate of 108 bpm. Isolated SVEs were rare (<1.0%), SVE Couplets were rare (<1.0%), and SVE Triplets were rare (<1.0%). Isolated VEs were rare  (<1.0%), and no VE Couplets or VE Triplets were present. Ventricular Bigeminy and  Trigeminy were present.         Recent Labs: 08/11/2023: Magnesium 2.6 09/08/2023: BUN 12; Creatinine, Ser 0.96; Potassium 3.7; Sodium 142  Recent Lipid Panel  Component Value Date/Time   CHOL 116 10/01/2022 0931   TRIG 127 10/01/2022 0931   HDL 39 (L) 10/01/2022 0931   CHOLHDL 3.0 10/01/2022 0931   CHOLHDL 3.4 08/08/2022 2238   VLDL 38 08/08/2022 2238   LDLCALC 54 10/01/2022 0931   Dated 12/09/22: A1c 6%. Cholesterol 112, triglycerides 136, HDL 37, LDL 51, CMET and CBC normal.  Dated 06/16/23: cholesterol 120, triglycerides 122, HDL 48, LDL 50. A1c 6.2%. creatinine 1.35. GFR 43. Otherwise CMET and CBC normal.  Dated 12/12/23: cholesterol 129, triglycerides 97, HDL 50, LDL 61. A1c 6%., CBC and CMET normal  Risk Assessment/Calculations:                Physical Exam:    VS:  BP (!) 112/58 (BP Location: Left Arm, Patient Position: Sitting, Cuff Size: Normal)   Pulse 79   Ht 5\' 4"  (1.626 m)   Wt 146 lb (66.2 kg)   BMI 25.06 kg/m     Wt Readings from Last 3 Encounters:  02/24/24 146 lb (66.2 kg)  08/22/23 159 lb 12.8 oz (72.5 kg)  06/03/23 167 lb 6.4 oz (75.9 kg)     GEN:  Well nourished, well developed in no acute distress HEENT: Normal NECK: No JVD; No carotid bruits LYMPHATICS: No lymphadenopathy CARDIAC: RRR, no murmurs, rubs, gallops RESPIRATORY:  Clear to auscultation without rales, wheezing or rhonchi  ABDOMEN: Soft, non-tender, non-distended MUSCULOSKELETAL:  No edema; No deformity  SKIN: Warm and dry NEUROLOGIC:  Alert and oriented x 3 PSYCHIATRIC:  Normal affect   ASSESSMENT:    1. Coronary artery disease of native artery of native heart with stable angina pectoris (HCC)   2. History of CVA (cerebrovascular accident)   3. Hypertension, unspecified type   4. Hyperlipidemia LDL goal <70      PLAN:    In order of problems listed above:  1. CAD: s/p STEMI, DES-RCA in 07/2022. She is asymptomatic. Myoview last year was normal. Continue aspirin and  Crestor. Currently on no antianginal therapy  2. CVA: S/p 2 small cortical infarcts in the left parietal lobe, chronic microhemorrhage in the right corona radiata, and tiny chronic infarct in the left cerebellum. Echo unremarkable.   Dopplers were unremarkable.  Event monitor showed no Afib.    3. Hypertension: BP well controlled. Continue losartan.    4. Hyperlipidemia:  LDL was 61. Continue aspirin, Crestor.        Followup in 6 months  Medication Adjustments/Labs and Tests Ordered: Current medicines are reviewed at length with the patient today.  Concerns regarding medicines are outlined above.  No orders of the defined types were placed in this encounter.  Meds ordered this encounter  Medications   nitroGLYCERIN (NITROSTAT) 0.4 MG SL tablet    Sig: Place 1 tablet (0.4 mg total) under the tongue every 5 (five) minutes as needed for chest pain.    Dispense:  25 tablet    Refill:  3    There are no Patient Instructions on file for this visit.   Signed, Takhia Spoon [place], MD  02/24/2024 10:04 AM     [facility]

## 2024-02-24 ENCOUNTER — Encounter: Payer: Self-pay | Admitting: Cardiology

## 2024-02-24 ENCOUNTER — Ambulatory Visit: Payer: Medicare HMO | Attending: Cardiology | Admitting: Cardiology

## 2024-02-24 VITALS — BP 112/58 | HR 79 | Ht 64.0 in | Wt 146.0 lb

## 2024-02-24 DIAGNOSIS — I25118 Atherosclerotic heart disease of native coronary artery with other forms of angina pectoris: Secondary | ICD-10-CM | POA: Diagnosis not present

## 2024-02-24 DIAGNOSIS — E785 Hyperlipidemia, unspecified: Secondary | ICD-10-CM | POA: Diagnosis not present

## 2024-02-24 DIAGNOSIS — I10 Essential (primary) hypertension: Secondary | ICD-10-CM

## 2024-02-24 DIAGNOSIS — Z8673 Personal history of transient ischemic attack (TIA), and cerebral infarction without residual deficits: Secondary | ICD-10-CM

## 2024-02-24 MED ORDER — NITROGLYCERIN 0.4 MG SL SUBL: 0.4000 mg | SUBLINGUAL_TABLET | SUBLINGUAL | 3 refills | Status: AC | PRN

## 2024-02-24 NOTE — Patient Instructions (Signed)
 Medication Instructions:  Continue same medications *If you need a refill on your cardiac medications before your next appointment, please call your pharmacy*   Lab Work: None ordered   Testing/Procedures: None ordered   Follow-Up: At [facility], you and your health needs are our priority.  As part of our continuing mission to provide you with exceptional heart care, we have created designated Provider Care Teams.  These Care Teams include your primary Cardiologist (physician) and Advanced Practice Providers (APPs -  Physician Assistants and Nurse Practitioners) who all work together to provide you with the care you need, when you need it.  We recommend signing up for the patient portal called "MyChart".  Sign up information is provided on this After Visit Summary.  MyChart is used to connect with patients for Virtual Visits (Telemedicine).  Patients are able to view lab/test results, encounter notes, upcoming appointments, etc.  Non-urgent messages can be sent to your provider as well.   To learn more about what you can do with MyChart, go to [email or web address].    Your next appointment:  6 months   Call in April to schedule August appointment     Provider:  Dr.Jordan

## 2024-03-08 ENCOUNTER — Other Ambulatory Visit (HOSPITAL_COMMUNITY)
Admission: RE | Admit: 2024-03-08 | Discharge: 2024-03-08 | Disposition: A | Discharge status: Home / Self Care | Source: Ambulatory Visit | Attending: Nephrology | Admitting: Nephrology

## 2024-03-08 DIAGNOSIS — R3129 Other microscopic hematuria: Secondary | ICD-10-CM | POA: Diagnosis not present

## 2024-03-08 DIAGNOSIS — R8290 Unspecified abnormal findings in urine: Secondary | ICD-10-CM | POA: Insufficient documentation

## 2024-03-08 DIAGNOSIS — N178 Other acute kidney failure: Secondary | ICD-10-CM | POA: Diagnosis present

## 2024-03-08 DIAGNOSIS — R808 Other proteinuria: Secondary | ICD-10-CM | POA: Insufficient documentation

## 2024-03-08 LAB — URINALYSIS, COMPLETE (UACMP) WITH MICROSCOPIC
Bacteria, UA: NONE SEEN
Bilirubin Urine: NEGATIVE
Glucose, UA: NEGATIVE mg/dL
Hgb urine dipstick: NEGATIVE
Ketones, ur: NEGATIVE mg/dL
Nitrite: NEGATIVE
Protein, ur: NEGATIVE mg/dL
Specific Gravity, Urine: 1.02 (ref 1.005–1.030)
pH: 5 (ref 5.0–8.0)

## 2024-03-08 LAB — RENAL FUNCTION PANEL
Albumin: 3.9 g/dL (ref 3.5–5.0)
Anion gap: 9 (ref 5–15)
BUN: 17 mg/dL (ref 8–23)
CO2: 28 mmol/L (ref 22–32)
Calcium: 9.7 mg/dL (ref 8.9–10.3)
Chloride: 103 mmol/L (ref 98–111)
Creatinine, Ser: 0.86 mg/dL (ref 0.44–1.00)
GFR, Estimated: >60 mL/min (ref >60)
Glucose, Bld: 96 mg/dL (ref 70–99)
Phosphorus: 4.2 mg/dL (ref 2.5–4.6)
Potassium: 4.5 mmol/L (ref 3.5–5.1)
Sodium: 140 mmol/L (ref 135–145)

## 2024-03-08 LAB — CBC WITH DIFFERENTIAL/PLATELET
Abs Immature Granulocytes: 0.01 10*3/uL (ref 0.00–0.07)
Basophils Absolute: 0 10*3/uL (ref 0.0–0.1)
Basophils Relative: 1 %
Eosinophils Absolute: 0.2 10*3/uL (ref 0.0–0.5)
Eosinophils Relative: 3 %
HCT: 45.4 % (ref 36.0–46.0)
Hemoglobin: 14.4 g/dL (ref 12.0–15.0)
Immature Granulocytes: 0 %
Lymphocytes Relative: 34 %
Lymphs Abs: 2.3 10*3/uL (ref 0.7–4.0)
MCH: 29.4 pg (ref 26.0–34.0)
MCHC: 31.7 g/dL (ref 30.0–36.0)
MCV: 92.8 fL (ref 80.0–100.0)
Monocytes Absolute: 0.6 10*3/uL (ref 0.1–1.0)
Monocytes Relative: 9 %
Neutro Abs: 3.7 10*3/uL (ref 1.7–7.7)
Neutrophils Relative %: 53 %
Platelets: 281 10*3/uL (ref 150–400)
RBC: 4.89 MIL/uL (ref 3.87–5.11)
RDW: 13.3 % (ref 11.5–15.5)
WBC: 6.9 10*3/uL (ref 4.0–10.5)
nRBC: 0 % (ref 0.0–0.2)

## 2024-03-08 LAB — PROTEIN / CREATININE RATIO, URINE
Creatinine, Urine: 126 mg/dL
Protein Creatinine Ratio: 0.11 mg/mg{creat} (ref 0.00–0.15)
Total Protein, Urine: 14 mg/dL

## 2024-03-08 LAB — URIC ACID: Uric Acid, Serum: 4.3 mg/dL (ref 2.5–7.1)

## 2024-06-02 ENCOUNTER — Other Ambulatory Visit: Payer: Self-pay | Admitting: Nurse Practitioner

## 2024-06-11 ENCOUNTER — Telehealth: Payer: Self-pay | Admitting: Nurse Practitioner

## 2024-06-11 MED ORDER — ROSUVASTATIN CALCIUM 40 MG PO TABS: 40.0000 mg | ORAL_TABLET | Freq: Every day | ORAL | 2 refills | Status: DC

## 2024-06-11 NOTE — Telephone Encounter (Signed)
*  STAT* If patient is at the pharmacy, call can be transferred to refill team.   1. Which medications need to be refilled? (please list name of each medication and dose if known) rosuvastatin  (CRESTOR ) 40 MG tablet  ASPIRIN  LOW DOSE 81 MG tablet  2. Which pharmacy/location (including street and city if local pharmacy) is medication to be sent to? [facility] [phone] - [place], [place] - [address] AT [facility] OF [address] & [address] [phone]   3. Do they need a 30 day or 90 day supply? 90 pt states Crestor  not rec'vd by pharmacy

## 2024-06-11 NOTE — Telephone Encounter (Signed)
 RX for Rosuvastatin  not received by Pharmacy. I sent in another RX for it.

## 2024-06-15 ENCOUNTER — Other Ambulatory Visit: Payer: Self-pay | Admitting: Gastroenterology

## 2024-06-15 DIAGNOSIS — R1310 Dysphagia, unspecified: Secondary | ICD-10-CM

## 2024-06-19 ENCOUNTER — Ambulatory Visit (HOSPITAL_COMMUNITY)
Admission: RE | Admit: 2024-06-19 | Discharge: 2024-06-19 | Disposition: A | Discharge status: Home / Self Care | Source: Ambulatory Visit | Attending: Nurse Practitioner | Admitting: Nurse Practitioner

## 2024-06-19 ENCOUNTER — Other Ambulatory Visit (HOSPITAL_COMMUNITY): Payer: Self-pay | Admitting: Nurse Practitioner

## 2024-06-19 DIAGNOSIS — R748 Abnormal levels of other serum enzymes: Secondary | ICD-10-CM

## 2024-06-19 DIAGNOSIS — R2242 Localized swelling, mass and lump, left lower limb: Secondary | ICD-10-CM | POA: Diagnosis present

## 2024-06-26 ENCOUNTER — Ambulatory Visit
Admission: RE | Admit: 2024-06-26 | Discharge: 2024-06-26 | Disposition: A | Discharge status: Home / Self Care | Source: Ambulatory Visit | Attending: Gastroenterology | Admitting: Gastroenterology

## 2024-06-26 DIAGNOSIS — R1310 Dysphagia, unspecified: Secondary | ICD-10-CM

## 2024-06-27 ENCOUNTER — Ambulatory Visit (HOSPITAL_COMMUNITY)
Admission: RE | Admit: 2024-06-27 | Discharge: 2024-06-27 | Disposition: A | Discharge status: Home / Self Care | Source: Ambulatory Visit | Attending: Nurse Practitioner | Admitting: Nurse Practitioner

## 2024-06-27 DIAGNOSIS — R748 Abnormal levels of other serum enzymes: Secondary | ICD-10-CM | POA: Insufficient documentation

## 2024-07-16 ENCOUNTER — Telehealth: Payer: Self-pay

## 2024-07-16 NOTE — Telephone Encounter (Signed)
   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

## 2024-07-17 ENCOUNTER — Other Ambulatory Visit (HOSPITAL_COMMUNITY): Payer: Self-pay | Admitting: Orthopaedic Surgery

## 2024-07-17 ENCOUNTER — Telehealth: Payer: Self-pay

## 2024-07-17 DIAGNOSIS — M79672 Pain in left foot: Secondary | ICD-10-CM

## 2024-07-17 NOTE — Telephone Encounter (Signed)
 Primary Cardiologist:Peter Sw[place]il[place], MD   Preoperative team, please contact this patient and set up a phone call appointment for further preoperative risk assessment. Please obtain consent and complete medication review. Thank you for your help.   I confirm that guidance regarding antiplatelet and oral anticoagulation therapy has been completed and, if necessary, noted below.  Ideally aspirin  should be continued without interruption, however if the bleeding risk is too great, aspirin  may be held for 5-7 days prior to surgery. Please resume aspirin  post operatively when it is felt to be safe from a bleeding standpoint.   I also confirmed the patient resides in the state of [place] . As per [facility] Medical Board telemedicine laws, the patient must reside in the state in which the provider is licensed.   Rosaline EMERSON Bane, NP-C  07/17/2024, 11:52 AM [address] [place], [place] [address] Office [phone] Fax [phone]

## 2024-07-17 NOTE — Telephone Encounter (Signed)
Patient has been scheduled for televisit.

## 2024-07-17 NOTE — Telephone Encounter (Signed)
 Patient has been scheduled for televisit med rec and consent done     Patient Consent for Virtual Visit         Katherine Short has provided verbal consent on 07/17/2024 for a virtual visit (video or telephone).   CONSENT FOR VIRTUAL VISIT FOR:  Katherine Short  By participating in this virtual visit I agree to the following:  I hereby voluntarily request, consent and authorize [facility] and its employed or contracted physicians, physician assistants, nurse practitioners or other licensed health care professionals (the Practitioner), to provide me with telemedicine health care services (the "Services) as deemed necessary by the treating Practitioner. I acknowledge and consent to receive the Services by the Practitioner via telemedicine. I understand that the telemedicine visit will involve communicating with the Practitioner through live audiovisual communication technology and the disclosure of certain medical information by electronic transmission. I acknowledge that I have been given the opportunity to request an in-person assessment or other available alternative prior to the telemedicine visit and am voluntarily participating in the telemedicine visit.  I understand that I have the right to withhold or withdraw my consent to the use of telemedicine in the course of my care at any time, without affecting my right to future care or treatment, and that the Practitioner or I may terminate the telemedicine visit at any time. I understand that I have the right to inspect all information obtained and/or recorded in the course of the telemedicine visit and may receive copies of available information for a reasonable fee.  I understand that some of the potential risks of receiving the Services via telemedicine include:  Delay or interruption in medical evaluation due to technological equipment failure or disruption; Information transmitted may not be sufficient (e.g. poor resolution of  images) to allow for appropriate medical decision making by the Practitioner; and/or  In rare instances, security protocols could fail, causing a breach of personal health information.  Furthermore, I acknowledge that it is my responsibility to provide information about my medical history, conditions and care that is complete and accurate to the best of my ability. I acknowledge that Practitioner's advice, recommendations, and/or decision may be based on factors not within their control, such as incomplete or inaccurate data provided by me or distortions of diagnostic images or specimens that may result from electronic transmissions. I understand that the practice of medicine is not an exact science and that Practitioner makes no warranties or guarantees regarding treatment outcomes. I acknowledge that a copy of this consent can be made available to me via my patient portal [facility] MyChart), or I can request a printed copy by calling the office of [facility].    I understand that my insurance will be billed for this visit.   I have read or had this consent read to me. I understand the contents of this consent, which adequately explains the benefits and risks of the Services being provided via telemedicine.  I have been provided ample opportunity to ask questions regarding this consent and the Services and have had my questions answered to my satisfaction. I give my informed consent for the services to be provided through the use of telemedicine in my medical care

## 2024-07-20 ENCOUNTER — Ambulatory Visit: Attending: Internal Medicine

## 2024-07-20 DIAGNOSIS — Z01810 Encounter for preprocedural cardiovascular examination: Secondary | ICD-10-CM

## 2024-07-20 NOTE — Progress Notes (Signed)
 Virtual Visit via Telephone Note   Because of [name] co-morbid illnesses, she is at least at moderate risk for complications without adequate follow up.  This format is felt to be most appropriate for this patient at this time.  Due to technical limitations with video connection (technology), today's appointment will be conducted as an audio only telehealth visit, and Katherine Short verbally agreed to proceed in this manner.   All issues noted in this document were discussed and addressed.  No physical exam could be performed with this format.  Evaluation Performed:  Preoperative cardiovascular risk assessment _____________   Date:  07/20/2024   Patient ID:  Katherine Short, DOB 10-19-1951, MRN [id number] Patient Location:  Home Provider location:   Office  Primary Care Provider:  Aura Portal, FNP Primary Cardiologist:  Peter Swaz[place], MD  Chief Complaint / Patient Profile   73 y.o. y/o female with a h/o CAD S/P STEMI, CVA, hypertension, hyperlipidemia, GERD, and anxiety who is pending left foot bunionette correction and excision of fifth toe chronic nonunion fragment and presents today for telephonic preoperative cardiovascular risk assessment.  History of Present Illness    Katherine Short is a 73 y.o. female who presents via audio/video conferencing for a telehealth visit today.  Pt was last seen in cardiology clinic on 02/19/24 by Dr. Swaziland.  At that time Katherine Short was doing well .  The patient is now pending procedure as outlined above. Since her last visit, she has been doing well without any pain. She does have some issues with the heat.  Ideally aspirin  should be continued without interruption, however if the bleeding risk is too great, aspirin  may be held for 5-7 days prior to surgery. Please resume aspirin  post operatively when it is felt to be safe from a bleeding standpoint.   Past Medical History    Past Medical History:  Diagnosis Date   Anxiety     Chest pain 03/19/2010   nuclear study was negative   Depression    Dyslipidemia    GERD (gastroesophageal reflux disease)    Headache(784.0)    Hypertension    Obstructive sleep apnea 08/24/2022   STEMI (ST elevation myocardial infarction) (HCC)    Stroke [facility])    Past Surgical History:  Procedure Laterality Date   BREAST LUMPECTOMY WITH RADIOACTIVE SEED LOCALIZATION Left 07/07/2017   Procedure: LEFT BREAST LUMPECTOMY WITH RADIOACTIVE SEED LOCALIZATION;  Surgeon: Curvin Deward MOULD, MD;  Location: [facility] OR;  Service: General;  Laterality: Left;   BREAST LUMPECTOMY WITH RADIOACTIVE SEED LOCALIZATION Left 07/18/2017   Procedure: LEFT BREAST LUMPECTOMY WITH RADIOACTIVE SEED LOCALIZATION;  Surgeon: Curvin Deward MOULD, MD;  Location: [facility];  Service: General;  Laterality: Left;   CATARACT EXTRACTION W/PHACO  11/22/2011   Procedure: CATARACT EXTRACTION PHACO AND INTRAOCULAR LENS PLACEMENT (IOC);  Surgeon: Cherene Mania;  Location: [facility];  Service: Ophthalmology;  Laterality: Left;  CDE:7.76   CATARACT EXTRACTION W/PHACO  12/02/2011   Procedure: CATARACT EXTRACTION PHACO AND INTRAOCULAR LENS PLACEMENT (IOC);  Surgeon: Cherene Mania;  Location: [facility];  Service: Ophthalmology;  Laterality: Right;  CDE=11.92   COLONOSCOPY     CORONARY/GRAFT ACUTE MI REVASCULARIZATION N/A 08/08/2022   Procedure: Coronary/Graft Acute MI Revascularization;  Surgeon: Swaziland, Peter M, MD;  Location: [facility] INVASIVE CV LAB;  Service: Cardiovascular;  Laterality: N/A;   LEFT HEART CATH AND CORONARY ANGIOGRAPHY N/A 08/08/2022   Procedure: LEFT HEART CATH AND CORONARY ANGIOGRAPHY;  Surgeon: Swaziland, Peter  M, MD;  Location: [facility] INVASIVE CV LAB;  Service: Cardiovascular;  Laterality: N/A;   TUBAL LIGATION      Allergies  Allergies  Allergen Reactions   Penicillins Itching and Rash         Home Medications    Prior to Admission medications   Medication Sig Start Date End Date Taking? Authorizing Provider  acetaminophen   (TYLENOL ) 500 MG tablet Take 500 mg by mouth every 6 (six) hours as needed for moderate pain.    [provider]  ASPIRIN  LOW DOSE 81 MG tablet TAKE 1 TABLET(81 MG) BY MOUTH DAILY. SWALLOW WHOLE 08/24/23   Daneen Damien BROCKS, NP  buPROPion  (WELLBUTRIN  XL) 300 MG 24 hr tablet Take 300 mg by mouth daily.    [provider]  gabapentin  (NEURONTIN ) 300 MG capsule Take 300 mg by mouth 3 (three) times daily. 06/05/17   [provider]  losartan (COZAAR) 25 MG tablet Take 25 mg by mouth daily. 08/18/23 08/17/24  [provider]  nitroGLYCERIN  (NITROSTAT ) 0.4 MG SL tablet Place 1 tablet (0.4 mg total) under the tongue every 5 (five) minutes as needed for chest pain. 02/24/24   [place], Peter M, MD  pantoprazole  (PROTONIX ) 40 MG tablet Take 40 mg by mouth daily.    [provider]  rosuvastatin  (CRESTOR ) 40 MG tablet Take 1 tablet (40 mg total) by mouth daily. 06/11/24   [place], Peter M, MD  temazepam (RESTORIL) 30 MG capsule Take 30 mg by mouth at bedtime as needed for sleep.    [provider]  WEGOVY 1 MG/0.5ML SOAJ Inject 1 mg into the skin once a week. 10/26/22   [provider]    Physical Exam    Vital Signs:  Katherine Short does not have vital signs available for review today.  Given telephonic nature of communication, physical exam is limited. AAOx3. NAD. Normal affect.  Speech and respirations are unlabored.  Accessory Clinical Findings    None  Assessment & Plan    1.  Preoperative Cardiovascular Risk Assessment:   Katherine Short perioperative risk of a major cardiac event is 6.6% according to the Revised Cardiac Risk Index (RCRI).  Therefore, she is at high risk for perioperative complications.   Her functional capacity is good at 5.62 METs according to the Duke Activity Status Index (DASI). Recommendations: According to ACC/AHA guidelines, no further cardiovascular testing needed.  The patient may proceed to surgery at acceptable  risk.   Antiplatelet and/or Anticoagulation Recommendations: Aspirin  can be held for 5-7 days prior to her surgery.  Please resume Aspirin  post operatively when it is felt to be safe from a bleeding standpoint.   The patient was advised that if she develops new symptoms prior to surgery to contact our office to arrange for a follow-up visit, and she verbalized understanding.   A copy of this note will be routed to requesting surgeon.  Time:   Today, I have spent 11 minutes with the patient with telehealth technology discussing medical history, symptoms, and management plan.     Katherine LOISE Fabry, PA-C  07/20/2024, 9:10 AM

## 2024-07-23 ENCOUNTER — Ambulatory Visit (HOSPITAL_COMMUNITY)
Admission: RE | Admit: 2024-07-23 | Discharge: 2024-07-23 | Disposition: A | Discharge status: Home / Self Care | Source: Ambulatory Visit | Attending: Orthopaedic Surgery | Admitting: Orthopaedic Surgery

## 2024-07-23 DIAGNOSIS — M79672 Pain in left foot: Secondary | ICD-10-CM | POA: Insufficient documentation

## 2024-08-10 NOTE — Progress Notes (Signed)
 Cardiology Office Note:    Date:  08/22/2024   ID:  Katherine Short, DOB 03/05/1951, MRN [id number]  PCP:  Aura Portal, FNP   [facility] Providers Cardiologist:  Avrie Kedzierski [place], MD     Referring MD: Aura Portal, FNP   Chief Complaint  Patient presents with   Coronary Artery Disease    History of Present Illness:    Katherine Short is a 73 y.o. female with a history of CAD s/p STEMI, DES-RCA in 07/2022, CVA, hypertension, hyperlipidemia, GERD, and anxiety who presents for follow-up related to CAD.  Myoview  in March 2011 was low risk.  Echocardiogram at the time was normal.  She presented to the ED on 08/08/2022 with acute onset chest pain. EKG showed inferior ST elevation with reciprocal changes in anterior leads.  She was transferred to [facility] for emergent cardiac catheterization which revealed acute occlusion of the PL branch of RCA s/p DES, otherwise diffuse moderate disease and small caliber LAD and diagonal vessels, EF 55-65%. Echocardiogram showed EF 60 to 65%,  normal LV function, no RWMA, G1 DD, reduced RV systolic function, mild LA enlargement, and mild mitral valve regurgitation.   She was started on aspirin , Brilinta , low-dose metoprolol , and Crestor  (previously on simvastatin ). Home Benicar  was held at discharge due to borderline BP.  She presented to the ED again on 08/12/2022 with reports of left arm tingling, left hip and left leg tingling and diaphoresis. MRI of the brain showed 2 small cortical infarcts in the left parietal lobe, chronic microhemorrhage in the right corona radiata, and tiny chronic infarct in the left cerebellum. MRA, carotid ultrasound, and Zio patch were recommended.  She was discharged home in stable condition. Carotid dopplers done were OK. Event monitor ordered but never completed. Seen by Neuro and sleep study recommended. This showed no significant sleep apnea.   When I saw  her in Feb 2024 her major complaint was of a lack of energy.  Insomnia. She is walking 3 miles a day and doing some work outside. Noted some chest discomfort at times across her upper chest. Has not needed to take sl Ntg. She had a Myoview  study that showed normal perfusion and EF.    On follow up today she is doing well. No chest pain or SOB. Walks regularly and does a lot of yard work. Helps take care of grandchildren. Notes recent labs showed elevation of transaminases. Plan repeat this next month.  Weight  is stable on Wegovy. Has a stress fracture in foot and may need surgery.    Past Medical History:  Diagnosis Date   Anxiety    Chest pain 03/19/2010   nuclear study was negative   Depression    Dyslipidemia    GERD (gastroesophageal reflux disease)    Headache(784.0)    Hypertension    Obstructive sleep apnea 08/24/2022   STEMI (ST elevation myocardial infarction) (HCC)    Stroke [facility])     Past Surgical History:  Procedure Laterality Date   BREAST LUMPECTOMY WITH RADIOACTIVE SEED LOCALIZATION Left 07/07/2017   Procedure: LEFT BREAST LUMPECTOMY WITH RADIOACTIVE SEED LOCALIZATION;  Surgeon: Curvin Deward MOULD, MD;  Location: [facility] OR;  Service: General;  Laterality: Left;   BREAST LUMPECTOMY WITH RADIOACTIVE SEED LOCALIZATION Left 07/18/2017   Procedure: LEFT BREAST LUMPECTOMY WITH RADIOACTIVE SEED LOCALIZATION;  Surgeon: Curvin Deward MOULD, MD;  Location: [facility];  Service: General;  Laterality: Left;   CATARACT EXTRACTION W/PHACO  11/22/2011   Procedure: CATARACT  EXTRACTION PHACO AND INTRAOCULAR LENS PLACEMENT (IOC);  Surgeon: Cherene Mania;  Location: [facility];  Service: Ophthalmology;  Laterality: Left;  CDE:7.76   CATARACT EXTRACTION W/PHACO  12/02/2011   Procedure: CATARACT EXTRACTION PHACO AND INTRAOCULAR LENS PLACEMENT (IOC);  Surgeon: Cherene Mania;  Location: [facility];  Service: Ophthalmology;  Laterality: Right;  CDE=11.92   COLONOSCOPY     CORONARY/GRAFT ACUTE MI REVASCULARIZATION N/A 08/08/2022   Procedure: Coronary/Graft Acute MI  Revascularization;  Surgeon: Swaziland, Salena Ortlieb M, MD;  Location: [facility] INVASIVE CV LAB;  Service: Cardiovascular;  Laterality: N/A;   LEFT HEART CATH AND CORONARY ANGIOGRAPHY N/A 08/08/2022   Procedure: LEFT HEART CATH AND CORONARY ANGIOGRAPHY;  Surgeon: Swazil[place], Kjirsten Bloodgood M, MD;  Location: [facility] INVASIVE CV LAB;  Service: Cardiovascular;  Laterality: N/A;   TUBAL LIGATION      Current Medications: Current Meds  Medication Sig   acetaminophen  (TYLENOL ) 500 MG tablet Take 500 mg by mouth every 6 (six) hours as needed for moderate pain.   ASPIRIN  LOW DOSE 81 MG tablet TAKE 1 TABLET(81 MG) BY MOUTH DAILY. SWALLOW WHOLE   buPROPion  (WELLBUTRIN  XL) 300 MG 24 hr tablet Take 300 mg by mouth daily.   gabapentin  (NEURONTIN ) 300 MG capsule Take 300 mg by mouth 3 (three) times daily.   losartan (COZAAR) 25 MG tablet Take 25 mg by mouth daily.   nitroGLYCERIN  (NITROSTAT ) 0.4 MG SL tablet Place 1 tablet (0.4 mg total) under the tongue every 5 (five) minutes as needed for chest pain.   pantoprazole  (PROTONIX ) 40 MG tablet Take 40 mg by mouth daily.   rosuvastatin  (CRESTOR ) 40 MG tablet Take 1 tablet (40 mg total) by mouth daily.   temazepam (RESTORIL) 30 MG capsule Take 30 mg by mouth at bedtime as needed for sleep.   WEGOVY 1 MG/0.5ML SOAJ Inject 1 mg into the skin once a week.     Allergies:   Penicillins   Social History   Socioeconomic History   Marital status: Married    Spouse name: Not on file   Number of children: Not on file   Years of education: Not on file   Highest education level: Not on file  Occupational History   Not on file  Tobacco Use   Smoking status: Never   Smokeless tobacco: Never  Vaping Use   Vaping status: Never Used  Substance and Sexual Activity   Alcohol use: No   Drug use: No   Sexual activity: Not on file  Other Topics Concern   Not on file  Social History Narrative   Caffiene sprite occasional. (Or Sprite zero).    Education HS grad   Retired:  Engineer, petroleum.     Social Drivers of Corporate investment banker Strain: Not on file  Food Insecurity: Not on file  Transportation Needs: Not on file  Physical Activity: Not on file  Stress: Not on file  Social Connections: Not on file     Family History: The patient's family history includes Cancer (age of onset: 37) in her mother; Diabetes in her father; Hypertension in her brother; Thyroid disease in her sister, sister, and sister.  ROS:   Please see the history of present illness.     All other systems reviewed and are negative.  EKGs/Labs/Other Studies Reviewed:    The following studies were reviewed today: Echo 08/09/22: IMPRESSIONS     1. Left ventricular ejection fraction, by estimation, is 60 to 65%. The  left ventricle has normal function. The  left ventricle has no regional  wall motion abnormalities. Left ventricular diastolic parameters are  consistent with Grade I diastolic  dysfunction (impaired relaxation).   2. Right ventricular systolic function is mildly reduced. The right  ventricular size is normal. Tricuspid regurgitation signal is inadequate  for assessing PA pressure.   3. Left atrial size was mildly dilated.   4. The mitral valve is abnormal. Mild mitral valve regurgitation.   5. The aortic valve is tricuspid. Aortic valve regurgitation is not  visualized.   6. The inferior vena cava is normal in size with greater than 50%  respiratory variability, suggesting right atrial pressure of 3 mmHg.   Cardiac cath/PCI 08/09/22: Procedures  Coronary/Graft Acute MI Revascularization  LEFT HEART CATH AND CORONARY ANGIOGRAPHY   Conclusion      RPAV lesion is 100% stenosed.   Mid RCA lesion is 30% stenosed.   Mid LAD lesion is 60% stenosed.   2nd Diag lesion is 70% stenosed.   1st Diag lesion is 50% stenosed.   1st Mrg lesion is 50% stenosed.   A drug-eluting stent was successfully placed using a SYNERGY XD 2.50X20.   Post intervention, there is a 0% residual  stenosis.   The left ventricular systolic function is normal.   LV end diastolic pressure is mildly elevated.   The left ventricular ejection fraction is 55-65% by visual estimate.   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  Good LV function with inferobasal HK Mildly elevated LVEDP 20 mm Hg Successful PCI of the PL branch of the RCA with DES x 1.    Plan: DAPT with ASA and Brilinta  for one year. May be a candidate for fast track discharge depending on clinical course.     Diagnostic Dominance: Right  Intervention   Myoview  03/10/23: Study Highlights      The study is normal. The study is low risk.   No ST deviation was noted.   LV perfusion is normal. There is no evidence of ischemia. There is no evidence of infarction.   Left ventricular function is normal. End diastolic cavity size is normal. End systolic cavity size is normal.   Prior study available for comparison from 03/19/2010.   Normal stress nuclear study with no ischemia or infarction; gated EF 69.  Event monitor 06/14/23: Study Highlights      Normal sinus rhythm with first degree AV block   Rare short runs of PAT. longest 12 seconds   Rare PVCs with bigeminy and trigeminy     Patch Wear Time:  4 days and 8 hours (2024-06-07T08:38:31-0400 to 2024-06-11T16:56:41-0400)   Patient had a min HR of 51 bpm, max HR of 171 bpm, and avg HR of 72 bpm. Predominant underlying rhythm was Sinus Rhythm. First Degree AV Block was present. Bundle Branch Block/IVCD was present. 4 Supraventricular Tachycardia runs occurred, the run with  the fastest interval lasting 5 beats with a max rate of 171 bpm, the longest lasting 12.3 secs with an avg rate of 108 bpm. Isolated SVEs were rare (<1.0%), SVE Couplets were rare (<1.0%), and SVE Triplets were rare (<1.0%). Isolated VEs were rare  (<1.0%), and no VE Couplets or VE Triplets were present. Ventricular Bigeminy and  Trigeminy were present.    EKG Interpretation Date/Time:  Wednesday August 22 2024 07:55:47 EDT Ventricular Rate:  65 PR Interval:  176 QRS Duration:  78 QT Interval:  400 QTC Calculation: 416 R  Axis:   -3  Text Interpretation: Normal sinus rhythm Possible Inferior infarct , age undetermined When compared with ECG of August 2024 No significant change was found Confirmed by [place], Arion Shankles [phone]) on 08/22/2024 8:02:35 AM    Recent Labs: 03/08/2024: BUN 17; Creatinine, Ser 0.86; Hemoglobin 14.4; Platelets 281; Potassium 4.5; Sodium 140  Recent Lipid Panel    Component Value Date/Time   CHOL 116 10/01/2022 0931   TRIG 127 10/01/2022 0931   HDL 39 (L) 10/01/2022 0931   CHOLHDL 3.0 10/01/2022 0931   CHOLHDL 3.4 08/08/2022 2238   VLDL 38 08/08/2022 2238   LDLCALC 54 10/01/2022 0931   Dated 12/09/22: A1c 6%. Cholesterol 112, triglycerides 136, HDL 37, LDL 51, CMET and CBC normal.  Dated 06/16/23: cholesterol 120, triglycerides 122, HDL 48, LDL 50. A1c 6.2%. creatinine 1.35. GFR 43. Otherwise CMET and CBC normal.  Dated 12/12/23: cholesterol 129, triglycerides 97, HDL 50, LDL 61. A1c 6%., CBC and CMET normal Dated 06/13/24: cholesterol 128, triglycerides 73, HDL 53, LDL 60. A1c 5.8%. ALT 81. Other labs ok.   EKG Interpretation Date/Time:  Wednesday August 22 2024 07:55:47 EDT Ventricular Rate:  65 PR Interval:  176 QRS Duration:  78 QT Interval:  400 QTC Calculation: 416 R Axis:   -3  Text Interpretation: Normal sinus rhythm Possible Inferior infarct , age undetermined When compared with ECG of August 2024 No significant change was found Confirmed by [place], Levoy Geisen [id number]) on 08/22/2024 8:02:35 AM    Risk Assessment/Calculations:                Physical Exam:    VS:  BP 118/64 (BP Location: Right Leg, Cuff Size: Normal)   Pulse 65   Ht 5' 4 (1.626 m)   Wt 143 lb 2 oz (64.9 kg)   BMI 24.57 kg/m     Wt Readings from Last 3 Encounters:  08/22/24 143 lb 2 oz (64.9 kg)   02/24/24 146 lb (66.2 kg)  08/22/23 159 lb 12.8 oz (72.5 kg)     GEN:  Well nourished, well developed in no acute distress HEENT: Normal NECK: No JVD; No carotid bruits LYMPHATICS: No lymphadenopathy CARDIAC: RRR, no murmurs, rubs, gallops RESPIRATORY:  Clear to auscultation without rales, wheezing or rhonchi  ABDOMEN: Soft, non-tender, non-distended MUSCULOSKELETAL:  No edema; No deformity  SKIN: Warm and dry NEUROLOGIC:  Alert and oriented x 3 PSYCHIATRIC:  Normal affect   ASSESSMENT:    1. Coronary artery disease of native artery of native heart with stable angina pectoris (HCC)   2. History of CVA (cerebrovascular accident)   3. Hyperlipidemia LDL goal <70   4. Hypertension, unspecified type       PLAN:    In order of problems listed above:  1. CAD: s/p STEMI, DES-RCA in 07/2022. She is asymptomatic. Myoview  March 2024 was normal. Continue aspirin  and Crestor . Currently on no antianginal therapy. If she does need foot surgery she is cleared from a CV standpoint.   2. CVA: S/p 2 small cortical infarcts in the left parietal lobe, chronic microhemorrhage in the right corona radiata, and tiny chronic infarct in the left cerebellum. Echo unremarkable.   Dopplers were unremarkable.  Event monitor showed no Afib.    3. Hypertension: BP well controlled. Continue losartan.    4. Hyperlipidemia:  LDL was 60. new transaminase elevation. Plan repeat lab this month. If still elevated would reduce Crestor  by half and then repeat lab. If LDL increases may consider adding Zetia or Repatha.  Followup in 6 months  Medication Adjustments/Labs and Tests Ordered: Current medicines are reviewed at length with the patient today.  Concerns regarding medicines are outlined above.  Orders Placed This Encounter  Procedures   EKG 12-Lead   EKG 12-Lead   No orders of the defined types were placed in this encounter.   Patient Instructions  Medication Instructions:  Continue same  medications *If you need a refill on your cardiac medications before your next appointment, please call your pharmacy*  Lab Work: None ordered  Testing/Procedures: None ordered  Follow-Up: At [facility], you and your health needs are our priority.  As part of our continuing mission to provide you with exceptional heart care, our providers are all part of one team.  This team includes your primary Cardiologist (physician) and Advanced Practice Providers or APPs (Physician Assistants and Nurse Practitioners) who all work together to provide you with the care you need, when you need it.  Your next appointment:  1 year   Call in April to schedule August appointment     Provider:  Dr.Berdell Hostetler    We recommend signing up for the patient portal called MyChart.  Sign up information is provided on this After Visit Summary.  MyChart is used to connect with patients for Virtual Visits (Telemedicine).  Patients are able to view lab/test results, encounter notes, upcoming appointments, etc.  Non-urgent messages can be sent to your provider as well.   To learn more about what you can do with MyChart, go to [email or web address].          Signed, Angeliyah Kirkey [place], MD  08/22/2024 8:14 AM    [facility]

## 2024-08-22 ENCOUNTER — Encounter: Payer: Self-pay | Admitting: Cardiology

## 2024-08-22 ENCOUNTER — Ambulatory Visit: Attending: Cardiology | Admitting: Cardiology

## 2024-08-22 VITALS — BP 118/64 | HR 65 | Ht 64.0 in | Wt 143.1 lb

## 2024-08-22 DIAGNOSIS — E785 Hyperlipidemia, unspecified: Secondary | ICD-10-CM

## 2024-08-22 DIAGNOSIS — Z8673 Personal history of transient ischemic attack (TIA), and cerebral infarction without residual deficits: Secondary | ICD-10-CM | POA: Diagnosis not present

## 2024-08-22 DIAGNOSIS — I10 Essential (primary) hypertension: Secondary | ICD-10-CM

## 2024-08-22 DIAGNOSIS — I25118 Atherosclerotic heart disease of native coronary artery with other forms of angina pectoris: Secondary | ICD-10-CM | POA: Diagnosis not present

## 2024-08-22 NOTE — Patient Instructions (Signed)
 Medication Instructions:  Continue same medications *If you need a refill on your cardiac medications before your next appointment, please call your pharmacy*  Lab Work: None ordered  Testing/Procedures: None ordered  Follow-Up: At [facility], you and your health needs are our priority.  As part of our continuing mission to provide you with exceptional heart care, our providers are all part of one team.  This team includes your primary Cardiologist (physician) and Advanced Practice Providers or APPs (Physician Assistants and Nurse Practitioners) who all work together to provide you with the care you need, when you need it.  Your next appointment:  1 year   Call in April to schedule August appointment     Provider:  Dr.Jordan   We recommend signing up for the patient portal called MyChart.  Sign up information is provided on this After Visit Summary.  MyChart is used to connect with patients for Virtual Visits (Telemedicine).  Patients are able to view lab/test results, encounter notes, upcoming appointments, etc.  Non-urgent messages can be sent to your provider as well.   To learn more about what you can do with MyChart, go to [email or web address].

## 2024-08-24 NOTE — Progress Notes (Signed)
 Order(s) created erroneously. Erroneous order ID: [id number]  Order moved by: ELOISA KRABBE  Order move date/time: 08/24/2024 11:16 AM  Source Patient: [id number]  Source Contact: 08/22/2024  Destination Patient: [id number]  Destination Contact: 06/08/2023

## 2024-09-17 ENCOUNTER — Other Ambulatory Visit: Payer: Self-pay | Admitting: Nurse Practitioner

## 2024-11-05 ENCOUNTER — Other Ambulatory Visit (HOSPITAL_COMMUNITY): Payer: Self-pay | Admitting: Nurse Practitioner

## 2024-11-05 ENCOUNTER — Telehealth: Payer: Self-pay | Admitting: Cardiology

## 2024-11-05 DIAGNOSIS — R42 Dizziness and giddiness: Secondary | ICD-10-CM

## 2024-11-05 NOTE — Telephone Encounter (Signed)
 Spoke to patient she stated while she was in grocery store on 11/2 she became very dizzy,she did not black out.Stated she has been having headaches and dizziness for the past 1 month.Stated 2 days later 11/4 at her daughter's home she did black out.Stated daughter witnessed and helped her to floor.Stated she does not remember blacking out.She saw PCP this morning and a head ct and carotid dopplers ordered,they have not been scheduled yet.PCP did not do a EKG.She wanted to know if Dr.Jordan recommends any more test or does she need to be seen.Message sent to Dr.Jordan for advice.

## 2024-11-05 NOTE — Telephone Encounter (Signed)
 Pt c/o Syncope: STAT if syncope occurred within 24 hours and pt complains of lightheadedness  Did you pass out today?  No    When is the last time you passed out?  11/02   Has this occurred multiple times?  Yes    Did you have any symptoms prior to passing out?  Dizziness    5. Did you fall? If so, are you on a blood thinner? Dis not fall. Had help being eased to the ground, but doesn't remember much. Not on a blood thinner.   Patient says Dr. Shona is sending this information to the office + CT head is being ordered to check for stroke. Carotid is also being ordered.

## 2024-11-07 NOTE — Telephone Encounter (Signed)
 Spoke to patient Dr.Jordan's advice given.

## 2024-11-14 ENCOUNTER — Ambulatory Visit (HOSPITAL_COMMUNITY)
Admission: RE | Admit: 2024-11-14 | Discharge: 2024-11-14 | Disposition: A | Discharge status: Home / Self Care | Source: Ambulatory Visit | Attending: Nurse Practitioner | Admitting: Nurse Practitioner

## 2024-11-14 DIAGNOSIS — R42 Dizziness and giddiness: Secondary | ICD-10-CM | POA: Diagnosis present

## 2024-12-05 ENCOUNTER — Telehealth: Payer: Self-pay

## 2024-12-05 NOTE — Telephone Encounter (Signed)
Spoke to patient carotid doppler results given.

## 2024-12-25 ENCOUNTER — Other Ambulatory Visit: Payer: Self-pay | Admitting: Cardiology
